# Patient Record
Sex: Female | Born: 1960
Health system: Southern US, Community
[De-identification: ages and names within clinical notes are randomized; demographics above are authoritative.]

## PROBLEM LIST (undated history)

## (undated) DIAGNOSIS — D649 Anemia, unspecified: Secondary | ICD-10-CM

## (undated) DIAGNOSIS — E785 Hyperlipidemia, unspecified: Secondary | ICD-10-CM

## (undated) DIAGNOSIS — E119 Type 2 diabetes mellitus without complications: Secondary | ICD-10-CM

## (undated) HISTORY — DX: Hyperlipidemia, unspecified: E78.5

## (undated) HISTORY — DX: Type 2 diabetes mellitus without complications: E11.9

## (undated) HISTORY — DX: Anemia, unspecified: D64.9

---

## 2005-07-01 ENCOUNTER — Ambulatory Visit: Payer: Self-pay | Admitting: Family Medicine

## 2005-07-01 DIAGNOSIS — E119 Type 2 diabetes mellitus without complications: Secondary | ICD-10-CM | POA: Insufficient documentation

## 2005-07-08 ENCOUNTER — Ambulatory Visit: Payer: Self-pay | Admitting: *Deleted

## 2005-07-21 ENCOUNTER — Ambulatory Visit: Payer: Self-pay | Admitting: Family Medicine

## 2005-08-21 ENCOUNTER — Ambulatory Visit (HOSPITAL_COMMUNITY): Admission: RE | Admit: 2005-08-21 | Discharge: 2005-08-21 | Payer: Self-pay | Admitting: Family Medicine

## 2005-10-20 ENCOUNTER — Ambulatory Visit: Payer: Self-pay | Admitting: Family Medicine

## 2005-10-22 ENCOUNTER — Ambulatory Visit: Payer: Self-pay | Admitting: Family Medicine

## 2005-12-22 ENCOUNTER — Ambulatory Visit: Payer: Self-pay | Admitting: Family Medicine

## 2005-12-22 LAB — CONVERTED CEMR LAB: Microalb, Ur: 0.47 mg/dL

## 2005-12-23 ENCOUNTER — Ambulatory Visit: Payer: Self-pay | Admitting: Family Medicine

## 2006-03-25 ENCOUNTER — Ambulatory Visit: Payer: Self-pay | Admitting: Family Medicine

## 2006-03-25 DIAGNOSIS — E785 Hyperlipidemia, unspecified: Secondary | ICD-10-CM

## 2006-03-25 DIAGNOSIS — D508 Other iron deficiency anemias: Secondary | ICD-10-CM

## 2006-03-26 ENCOUNTER — Encounter (INDEPENDENT_AMBULATORY_CARE_PROVIDER_SITE_OTHER): Payer: Self-pay | Admitting: Family Medicine

## 2006-03-26 LAB — CONVERTED CEMR LAB
Cholesterol: 229 mg/dL
LDL Cholesterol: 160 mg/dL

## 2006-04-11 ENCOUNTER — Emergency Department (HOSPITAL_COMMUNITY): Admission: EM | Admit: 2006-04-11 | Discharge: 2006-04-11 | Payer: Self-pay | Admitting: Emergency Medicine

## 2007-03-03 ENCOUNTER — Encounter (INDEPENDENT_AMBULATORY_CARE_PROVIDER_SITE_OTHER): Payer: Self-pay | Admitting: *Deleted

## 2007-04-29 ENCOUNTER — Encounter (INDEPENDENT_AMBULATORY_CARE_PROVIDER_SITE_OTHER): Payer: Self-pay | Admitting: Family Medicine

## 2007-05-05 ENCOUNTER — Telehealth (INDEPENDENT_AMBULATORY_CARE_PROVIDER_SITE_OTHER): Payer: Self-pay | Admitting: Family Medicine

## 2007-05-31 ENCOUNTER — Ambulatory Visit: Payer: Self-pay | Admitting: Family Medicine

## 2007-06-04 ENCOUNTER — Ambulatory Visit: Payer: Self-pay | Admitting: Family Medicine

## 2007-06-05 ENCOUNTER — Encounter (INDEPENDENT_AMBULATORY_CARE_PROVIDER_SITE_OTHER): Payer: Self-pay | Admitting: Family Medicine

## 2007-06-05 LAB — CONVERTED CEMR LAB
Albumin: 4.7 g/dL (ref 3.5–5.2)
BUN: 10 mg/dL (ref 6–23)
Calcium: 9.5 mg/dL (ref 8.4–10.5)
Chloride: 101 meq/L (ref 96–112)
Cholesterol: 180 mg/dL (ref 0–200)
Creatinine, Ser: 0.72 mg/dL (ref 0.40–1.20)
Glucose, Bld: 82 mg/dL (ref 70–99)
HDL: 48 mg/dL (ref >39)
Hemoglobin: 12.6 g/dL (ref 12.0–15.0)
Lymphs Abs: 3.4 10*3/uL (ref 0.7–4.0)
MCV: 75.9 fL — ABNORMAL LOW (ref 78.0–100.0)
Monocytes Absolute: 0.4 10*3/uL (ref 0.1–1.0)
Monocytes Relative: 5 % (ref 3–12)
Neutro Abs: 3.8 10*3/uL (ref 1.7–7.7)
Neutrophils Relative %: 49 % (ref 43–77)
Potassium: 4 meq/L (ref 3.5–5.3)
RBC: 5.44 M/uL — ABNORMAL HIGH (ref 3.87–5.11)
Triglycerides: 87 mg/dL (ref <150)
WBC: 7.8 10*3/uL (ref 4.0–10.5)

## 2007-06-25 ENCOUNTER — Ambulatory Visit (HOSPITAL_COMMUNITY): Admission: RE | Admit: 2007-06-25 | Discharge: 2007-06-25 | Payer: Self-pay | Admitting: Family Medicine

## 2007-09-07 ENCOUNTER — Ambulatory Visit: Payer: Self-pay | Admitting: Family Medicine

## 2007-09-07 ENCOUNTER — Encounter (INDEPENDENT_AMBULATORY_CARE_PROVIDER_SITE_OTHER): Payer: Self-pay | Admitting: Family Medicine

## 2007-09-07 LAB — CONVERTED CEMR LAB
Beta hcg, urine, semiquantitative: NEGATIVE
Bilirubin Urine: NEGATIVE
Blood in Urine, dipstick: NEGATIVE
Glucose, Urine, Semiquant: NEGATIVE
Hgb A1c MFr Bld: 7.8 %
Ketones, urine, test strip: NEGATIVE
Specific Gravity, Urine: <1.005
WBC Urine, dipstick: NEGATIVE
pH: 5.5

## 2007-09-09 LAB — CONVERTED CEMR LAB: Pap Smear: NORMAL

## 2007-12-08 ENCOUNTER — Telehealth (INDEPENDENT_AMBULATORY_CARE_PROVIDER_SITE_OTHER): Payer: Self-pay | Admitting: Family Medicine

## 2008-04-06 ENCOUNTER — Telehealth (INDEPENDENT_AMBULATORY_CARE_PROVIDER_SITE_OTHER): Payer: Self-pay | Admitting: *Deleted

## 2008-04-11 ENCOUNTER — Ambulatory Visit: Payer: Self-pay | Admitting: Family Medicine

## 2008-04-11 DIAGNOSIS — M25519 Pain in unspecified shoulder: Secondary | ICD-10-CM | POA: Insufficient documentation

## 2008-04-11 LAB — CONVERTED CEMR LAB: Blood Glucose, Fingerstick: 116

## 2008-04-13 ENCOUNTER — Encounter (INDEPENDENT_AMBULATORY_CARE_PROVIDER_SITE_OTHER): Payer: Self-pay | Admitting: Family Medicine

## 2008-04-13 LAB — CONVERTED CEMR LAB
AST: 23 units/L (ref 0–37)
Albumin: 4.5 g/dL (ref 3.5–5.2)
Alkaline Phosphatase: 65 units/L (ref 39–117)
Eosinophils Absolute: 0.3 10*3/uL (ref 0.0–0.7)
Glucose, Bld: 67 mg/dL — ABNORMAL LOW (ref 70–99)
LDL Cholesterol: 88 mg/dL (ref 0–99)
Lymphs Abs: 3.6 10*3/uL (ref 0.7–4.0)
MCV: 73.4 fL — ABNORMAL LOW (ref 78.0–100.0)
Neutrophils Relative %: 43 % (ref 43–77)
Platelets: 335 10*3/uL (ref 150–400)
Potassium: 4.2 meq/L (ref 3.5–5.3)
Sodium: 137 meq/L (ref 135–145)
Total Protein: 7.9 g/dL (ref 6.0–8.3)
WBC: 7.6 10*3/uL (ref 4.0–10.5)

## 2008-04-19 ENCOUNTER — Telehealth (INDEPENDENT_AMBULATORY_CARE_PROVIDER_SITE_OTHER): Payer: Self-pay | Admitting: Family Medicine

## 2008-06-30 ENCOUNTER — Ambulatory Visit (HOSPITAL_COMMUNITY): Admission: RE | Admit: 2008-06-30 | Discharge: 2008-06-30 | Payer: Self-pay | Admitting: Family Medicine

## 2008-07-12 ENCOUNTER — Encounter (INDEPENDENT_AMBULATORY_CARE_PROVIDER_SITE_OTHER): Payer: Self-pay | Admitting: *Deleted

## 2008-07-26 ENCOUNTER — Ambulatory Visit: Payer: Self-pay | Admitting: Family Medicine

## 2008-07-26 LAB — CONVERTED CEMR LAB: Blood Glucose, Fingerstick: 122

## 2008-09-07 ENCOUNTER — Encounter (INDEPENDENT_AMBULATORY_CARE_PROVIDER_SITE_OTHER): Payer: Self-pay | Admitting: Family Medicine

## 2008-11-01 ENCOUNTER — Ambulatory Visit: Payer: Self-pay | Admitting: *Deleted

## 2008-11-19 ENCOUNTER — Emergency Department (HOSPITAL_COMMUNITY): Admission: EM | Admit: 2008-11-19 | Discharge: 2008-11-19 | Payer: Self-pay | Admitting: Family Medicine

## 2008-11-27 ENCOUNTER — Ambulatory Visit: Payer: Self-pay | Admitting: Family Medicine

## 2008-11-27 DIAGNOSIS — N764 Abscess of vulva: Secondary | ICD-10-CM

## 2008-12-04 ENCOUNTER — Telehealth (INDEPENDENT_AMBULATORY_CARE_PROVIDER_SITE_OTHER): Payer: Self-pay | Admitting: Family Medicine

## 2008-12-07 ENCOUNTER — Ambulatory Visit: Payer: Self-pay | Admitting: Nurse Practitioner

## 2008-12-07 LAB — CONVERTED CEMR LAB
Basophils Absolute: 0 10*3/uL (ref 0.0–0.1)
Blood Glucose, Fingerstick: 204
Eosinophils Relative: 5 % (ref 0–5)
Glucose, Urine, Semiquant: NEGATIVE
Hgb A1c MFr Bld: 8.1 %
Lymphocytes Relative: 41 % (ref 12–46)
Microalb, Ur: 1.39 mg/dL (ref 0.00–1.89)
Neutro Abs: 3.5 10*3/uL (ref 1.7–7.7)
Platelets: 374 10*3/uL (ref 150–400)
RDW: 17.1 % — ABNORMAL HIGH (ref 11.5–15.5)
Specific Gravity, Urine: >=1.03
pH: 6

## 2008-12-08 ENCOUNTER — Encounter (INDEPENDENT_AMBULATORY_CARE_PROVIDER_SITE_OTHER): Payer: Self-pay | Admitting: Nurse Practitioner

## 2008-12-12 ENCOUNTER — Inpatient Hospital Stay (HOSPITAL_COMMUNITY): Admission: AD | Admit: 2008-12-12 | Discharge: 2008-12-12 | Payer: Self-pay | Admitting: Obstetrics & Gynecology

## 2009-01-15 ENCOUNTER — Ambulatory Visit: Payer: Self-pay | Admitting: Family Medicine

## 2009-01-15 DIAGNOSIS — M1610 Unilateral primary osteoarthritis, unspecified hip: Secondary | ICD-10-CM | POA: Insufficient documentation

## 2009-01-15 LAB — CONVERTED CEMR LAB: Blood Glucose, Fingerstick: 129

## 2009-01-16 ENCOUNTER — Ambulatory Visit (HOSPITAL_COMMUNITY): Admission: RE | Admit: 2009-01-16 | Discharge: 2009-01-16 | Payer: Self-pay | Admitting: Family Medicine

## 2009-01-24 ENCOUNTER — Ambulatory Visit: Payer: Self-pay | Admitting: Nurse Practitioner

## 2009-01-25 DIAGNOSIS — M5137 Other intervertebral disc degeneration, lumbosacral region: Secondary | ICD-10-CM

## 2009-01-25 DIAGNOSIS — M51379 Other intervertebral disc degeneration, lumbosacral region without mention of lumbar back pain or lower extremity pain: Secondary | ICD-10-CM | POA: Insufficient documentation

## 2009-01-25 DIAGNOSIS — M1710 Unilateral primary osteoarthritis, unspecified knee: Secondary | ICD-10-CM | POA: Insufficient documentation

## 2009-01-25 DIAGNOSIS — IMO0002 Reserved for concepts with insufficient information to code with codable children: Secondary | ICD-10-CM

## 2009-02-07 ENCOUNTER — Ambulatory Visit: Payer: Self-pay | Admitting: *Deleted

## 2009-07-31 ENCOUNTER — Ambulatory Visit: Payer: Self-pay | Admitting: Nurse Practitioner

## 2009-09-07 ENCOUNTER — Ambulatory Visit: Payer: Self-pay | Admitting: Nurse Practitioner

## 2009-09-08 ENCOUNTER — Encounter (INDEPENDENT_AMBULATORY_CARE_PROVIDER_SITE_OTHER): Payer: Self-pay | Admitting: Nurse Practitioner

## 2009-09-10 ENCOUNTER — Encounter (INDEPENDENT_AMBULATORY_CARE_PROVIDER_SITE_OTHER): Payer: Self-pay | Admitting: Nurse Practitioner

## 2009-09-10 ENCOUNTER — Telehealth (INDEPENDENT_AMBULATORY_CARE_PROVIDER_SITE_OTHER): Payer: Self-pay | Admitting: Nurse Practitioner

## 2009-09-10 LAB — CONVERTED CEMR LAB
ALT: 15 units/L (ref 0–35)
AST: 20 units/L (ref 0–37)
Albumin: 4.5 g/dL (ref 3.5–5.2)
Alkaline Phosphatase: 68 units/L (ref 39–117)
Basophils Absolute: 0 10*3/uL (ref 0.0–0.1)
Calcium: 9.4 mg/dL (ref 8.4–10.5)
Chlamydia, DNA Probe: NEGATIVE
Chloride: 102 meq/L (ref 96–112)
Eosinophils Relative: 1 % (ref 0–5)
GC Probe Amp, Genital: NEGATIVE
HCT: 39.1 % (ref 36.0–46.0)
LDL Cholesterol: 123 mg/dL — ABNORMAL HIGH (ref 0–99)
Lymphocytes Relative: 38 % (ref 12–46)
Lymphs Abs: 2.3 10*3/uL (ref 0.7–4.0)
Neutro Abs: 3.3 10*3/uL (ref 1.7–7.7)
Neutrophils Relative %: 54 % (ref 43–77)
Platelets: 353 10*3/uL (ref 150–400)
Potassium: 4.4 meq/L (ref 3.5–5.3)
Sodium: 141 meq/L (ref 135–145)
TSH: 2.07 microintl units/mL (ref 0.350–4.500)
WBC: 6.2 10*3/uL (ref 4.0–10.5)

## 2009-09-21 ENCOUNTER — Ambulatory Visit (HOSPITAL_COMMUNITY): Admission: RE | Admit: 2009-09-21 | Discharge: 2009-09-21 | Payer: Self-pay | Admitting: Internal Medicine

## 2009-11-22 ENCOUNTER — Ambulatory Visit: Payer: Self-pay | Admitting: Internal Medicine

## 2009-11-22 ENCOUNTER — Encounter (INDEPENDENT_AMBULATORY_CARE_PROVIDER_SITE_OTHER): Payer: Self-pay | Admitting: Nurse Practitioner

## 2009-12-12 ENCOUNTER — Encounter (INDEPENDENT_AMBULATORY_CARE_PROVIDER_SITE_OTHER): Payer: Self-pay | Admitting: Nurse Practitioner

## 2010-07-14 LAB — CONVERTED CEMR LAB
Blood in Urine, dipstick: NEGATIVE
Glucose, Urine, Semiquant: NEGATIVE
KOH Prep: NEGATIVE
Ketones, urine, test strip: NEGATIVE
Pap Smear: NEGATIVE
Protein, U semiquant: NEGATIVE
WBC Urine, dipstick: NEGATIVE

## 2010-07-16 NOTE — Letter (Signed)
Summary: Lipid Letter  [facility]  [address] [place], [place] [address]   Phone: [phone]  Fax: [phone]    09/10/2009  Debralee Braaksma [address] [place], [place]  [address]  Dear Arley Phenix:  We have carefully reviewed your last lipid profile from 09/08/2009 and the results are noted below with a summary of recommendations for lipid management.    Cholesterol:       183     Goal: less than 200   HDL "good" Cholesterol:   39     Goal: greater than 40   LDL "bad" Cholesterol:   123     Goal: less than 70   Triglycerides:       106     Goal: less than 150    Your "Bad Cholesterol" is still slightly elevated.   Crestor was increased to 20mg  by mouth  nightly. All other labs are ok.    Pap Smear results __________________________.     Current Medications: 1)    Ferrous Sulfate 325 (65 Fe) Mg  Tabs (Ferrous sulfate) .Marland Kitchen.. 1 tablet po each  day 2)    Glucovance 5-500 Mg  Tabs (Glyburide-metformin) .... Two tablets by mouth two times a day 3)    Crestor 20 Mg Tabs (Rosuvastatin calcium) .... One tablet by mouth nightly for cholesterol  **note dose increase** 4)    Actos 30 Mg Tabs (Pioglitazone hcl) .Marland Kitchen.. 1 tablet by mouth daily for blood sugar **note change in dose** 5)    Arthrotec 75 75-200 Mg-mcg Tabs (Diclofenac-misoprostol) .... One tablet by mouth two times a day for inflammation in hip & knee 6)    Aspir-low 81 Mg Tbec (Aspirin) .... One tablet by mouth daily  If you have any questions, please call. We appreciate being able to work with you.   Sincerely,    [facility] Lehman Prom FNP

## 2010-07-16 NOTE — Letter (Signed)
Summary: *HSN Results Follow up  [facility]  [address] [place], [place] [address]   Phone: [phone]  Fax: [phone]      12/12/2009   Meredith Ryan [address] [place], [place]  [address]   Dear  [name] Plan,                            ____S.Drinkard,FNP   ____D. Gore,FNP       ____B. McPherson,MD   ____V. Rankins,MD    ____E. Mulberry,MD    _X___[name], FNP  ____D. Reche Dixon, MD    ____K. Philipp Deputy, MD    ____Other     This letter is to inform you that your recent test(s):  [facility] Scre[facility]   ___X____ is within acceptable limits  _______ requires a medication change  _______ requires a follow-up lab visit  _______ requires a follow-up visit with your provider   Comments:  [facility] eye screening is normal.       _________________________________________________________ If you have any questions, please contact our office [phone]                    Sincerely,    Lehman Prom FNP [facility]

## 2010-07-16 NOTE — Assessment & Plan Note (Signed)
Summary: Complete Physical Exam   Vital Signs:  Patient profile:   50 year old female Menstrual status:  perimenopausal LMP:     06/18/2009 Weight:      177.8 pounds BMI:     31.36 BSA:     1.85 Temp:     97.5 degrees F oral Pulse rate:   75 / minute Pulse rhythm:   regular Resp:     20 per minute BP sitting:   118 / 79  (left arm) Cuff size:   large  Vitals Entered By: Levon Hedger (September 07, 2009 2:43 PM) CC: CPP Is Patient Diabetic? Yes Pain Assessment Patient in pain? no      CBG Result 78 CBG Device ID B  Does patient need assistance? Functional Status Self care Ambulation Normal  Vision Screening:      Vision Comments: 06/2009 LMP (date): 06/18/2009 LMP - Character: normal     Menstrual Status perimenopausal Enter LMP: 06/18/2009 Last PAP Result normal   Diabetic Foot Exam Foot Inspection Is there a history of a foot ulcer?              No Is there a foot ulcer now?              No Can the patient see the bottom of their feet?          No Are the shoes appropriate in style and fit?          Yes Is there swelling or an abnormal foot shape?          No Are the toenails long?                No Are the toenails thick?                No Are the toenails ingrown?              No Is there heavy callous build-up?              No Is there pain in the calf muscle (Intermittent claudication) when walking?    NoIs there a claw toe deformity?              No Is there elevated skin temperature?            No Is there limited ankle dorsiflexion?            No Is there foot or ankle muscle weakness?            No  Diabetic Foot Care Education Pulse Check          Right Foot          Left Foot Dorsalis Pedis:        normal            normal    10-g (5.07) Semmes-Weinstein Monofilament Test           Right Foot          Left Foot Visual Inspection               Test Control      normal         normal Site 1         normal         normal Site 2          normal         normal Site 3  normal         normal Site 4         normal         normal Site 5         normal         normal Site 6         normal         normal Site 7         normal         normal Site 8         normal         normal Site 9         normal         normal Site 10         normal         normal  Impression      normal         normal  Legend:  Site 1 = Plantar aspect of first toe (center of pad) Site 2 = Plantar aspect of third toe (center of pad) Site 3 = Plantar aspect of fifth toe (center of pad) Site 4 = Plantar aspect of first metatarsal head Site 5 = Plantar aspect of third metatarsal head Site 6 = Plantar aspect of fifth metatarsal head Site 7 = Plantar aspect of medial midfoot Site 8 = Plantar aspect of lateral midfoot Site 9 = Plantar aspect of heel Site 10 = dorsal aspect of foot between the base of the first and second toes   Result is Abnormal if patient was unable to perceive the monofilament at site indicated.    CC:  CPP.  History of Present Illness:  Pt into the office for a complete physical exam  PAP - Last pap smear was about 2 years ago Pt is married with 5 children.   Menses - irregular, last menses was 2 months ago  Mammogram - last mammogram was about 2 years ago no family history of breast cancer. no self breast exams at home  Dental - no recent dental exam but aware that she needs a dental exam  Optho - wears glasses but last eye exam was over 1 year ago  tdap - up to date  Colonscopy - never done no family hx of colon cancer  Diabetes Management History:      The patient is a 50 years old female who comes in for evaluation of DM Type 2.  She has not been enrolled in the "Diabetic Education Program".  She states understanding of dietary principles and is following her diet appropriately.  No sensory loss is reported.  Self foot exams are not being performed.  She is checking home blood sugars.  She says that she is  exercising.  Type of exercise includes: walking.  Duration of exercise is estimated to be 15 min.  She is doing this 3 times per week.        Hypoglycemic symptoms are not occurring.  No hyperglycemic symptoms are reported.        No changes have been made to her treatment plan since last visit.     Habits & Providers  Alcohol-Tobacco-Diet     Alcohol drinks/day: 0     Tobacco Status: never  Exercise-Depression-Behavior     Does Patient Exercise: yes     Exercise Counseling: to improve exercise regimen     Type of exercise: walking     Exercise (avg:  min/session): 15 min.     Times/week: 3     Have you felt down or hopeless? no     Have you felt little pleasure in things? no     Depression Counseling: not indicated; screening negative for depression     Drug Use: no     Seat Belt Use: 100  Diabetic Foot Exam Foot Inspection Is there a history of a foot ulcer?              No Is there a foot ulcer now?              No Can the patient see the bottom of their feet?          No Are the shoes appropriate in style and fit?          Yes Is there swelling or an abnormal foot shape?          No Are the toenails long?                No Are the toenails thick?                No Are the toenails ingrown?              No Is there heavy callous build-up?              No Is there pain in the calf muscle (Intermittent claudication) when walking?    NoIs there a claw toe deformity?              No Is there elevated skin temperature?            No Is there limited ankle dorsiflexion?            No Is there foot or ankle muscle weakness?            No  Diabetic Foot Care Education Pulse Check          Right Foot          Left Foot Dorsalis Pedis:        normal            normal    10-g (5.07) Semmes-Weinstein Monofilament Test           Right Foot          Left Foot Visual Inspection               Test Control      normal         normal Site 1         normal         normal Site 2          normal         normal Site 3         normal         normal Site 4         normal         normal Site 5         normal         normal Site 6         normal         normal Site 7         normal         normal Site 8         normal  normal Site 9         normal         normal Site 10         normal         normal  Impression      normal         normal  Legend:  Site 1 = Plantar aspect of first toe (center of pad) Site 2 = Plantar aspect of third toe (center of pad) Site 3 = Plantar aspect of fifth toe (center of pad) Site 4 = Plantar aspect of first metatarsal head Site 5 = Plantar aspect of third metatarsal head Site 6 = Plantar aspect of fifth metatarsal head Site 7 = Plantar aspect of medial midfoot Site 8 = Plantar aspect of lateral midfoot Site 9 = Plantar aspect of heel Site 10 = dorsal aspect of foot between the base of the first and second toes   Result is Abnormal if patient was unable to perceive the monofilament at site indicated.   Allergies (verified): No Known Drug Allergies  Review of Systems General:  Denies fever. Eyes:  Denies blurring. ENT:  Denies earache. CV:  Denies chest pain or discomfort. Resp:  Denies cough. GI:  Denies abdominal pain, nausea, and vomiting. GU:  Denies discharge. MS:  Complains of joint pain; bil knee pain but improved with arthrotec. Derm:  Denies rash. Neuro:  Denies headaches. Psych:  Denies anxiety and depression.  Physical Exam  General:  alert.  obese Head:  normocephalic.   Eyes:  pupils equal, pupils round, and pupils reactive to light.   Ears:  R ear normal and L ear normal.  Bil TM with bony landmarks present Nose:  no nasal discharge.   Mouth:  pharynx pink and moist and fair dentition.   Neck:  supple.   Chest Wall:  no mass.   Breasts:  skin/areolae normal, no masses, and no abnormal thickening.   Lungs:  normal breath sounds.   Heart:  normal rate and regular rhythm.   Abdomen:  soft, non-tender,  and normal bowel sounds.   Rectal:  no external abnormalities.    Pelvic Exam  Vulva:      normal appearance.   Urethra and Bladder:      female circumcion Cervix:      midposition.   Adnexa:      nontender bilaterally.   Rectum:      normal, heme negative stool.    Diabetes Management Exam:    Foot Exam (with socks and/or shoes not present):       Sensory-Monofilament:          Left foot: normal          Right foot: normal       Inspection:          Left foot: normal          Right foot: normal       Nails:          Left foot: normal          Right foot: normal    Eye Exam:       Eye Exam done elsewhere          Date: 06/16/2008          Results: normal   Diabetic Foot Exam Foot Inspection Is there a history of a foot ulcer?              No Is there  a foot ulcer now?              No Can the patient see the bottom of their feet?          No Are the shoes appropriate in style and fit?          Yes Is there swelling or an abnormal foot shape?          No Are the toenails long?                No Are the toenails thick?                No Are the toenails ingrown?              No Is there heavy callous build-up?              No Is there pain in the calf muscle (Intermittent claudication) when walking?    NoIs there a claw toe deformity?              No Is there elevated skin temperature?            No Is there limited ankle dorsiflexion?            No Is there foot or ankle muscle weakness?            No  Diabetic Foot Care Education Pulse Check          Right Foot          Left Foot Dorsalis Pedis:        normal            normal    10-g (5.07) Semmes-Weinstein Monofilament Test           Right Foot          Left Foot Visual Inspection               Test Control      normal         normal Site 1         normal         normal Site 2         normal         normal Site 3         normal         normal Site 4         normal         normal Site 5         normal          normal Site 6         normal         normal Site 7         normal         normal Site 8         normal         normal Site 9         normal         normal Site 10         normal         normal  Impression      normal         normal  Legend:  Site 1 = Plantar aspect of first toe (center of pad) Site 2 = Plantar aspect of third toe (center of pad) Site 3 = Plantar aspect  of fifth toe (center of pad) Site 4 = Plantar aspect of first metatarsal head Site 5 = Plantar aspect of third metatarsal head Site 6 = Plantar aspect of fifth metatarsal head Site 7 = Plantar aspect of medial midfoot Site 8 = Plantar aspect of lateral midfoot Site 9 = Plantar aspect of heel Site 10 = dorsal aspect of foot between the base of the first and second toes   Result is Abnormal if patient was unable to perceive the monofilament at site indicated.    Impression & Recommendations:  Problem # 1:  ROUTINE GYNECOLOGICAL EXAMINATION (ICD-V72.31) will check labs today PHQ-9 score = 0 PAP done EKG done stool card given to pt. colonscopy indications given to pt rec optho exam and dental exam Orders: KOH/ WET Mount [phone]) Pap Smear, Thin Prep ( Collection of) (J4782) UA Dipstick w/o Micro (manual) (95621) Hemoccult Guaiac-1 spec.(in office) (82270) T- GC Chlamydia (30865) Rapid HIV  (92370) Hemoccult Cards (Take Home) (Hemoccult Cards)  Problem # 2:  UNSPECIFIED BREAST SCREENING (ICD-V76.10) self breast exam done mammogram scheduled Orders: Mammogram (Screening) (Mammo)  Problem # 3:  DIABETES MELLITUS, TYPE II (ICD-250.00) improved continue current medications rec eye exam Her updated medication list for this problem includes:    Glucovance 5-500 Mg Tabs (Glyburide-metformin) .Marland Kitchen... 2 by mouth two times a day    Actos 30 Mg Tabs (Pioglitazone hcl) .Marland Kitchen... 1 tablet by mouth daily for blood sugar **note change in dose**    Aspir-low 81 Mg Tbec (Aspirin) ..... One tablet by mouth  daily  Orders: Hemoglobin A1C (78469) T-Comprehensive Metabolic Panel ([address]2952-84132) T-Urine Microalbumin w/creat. ratio [phone]) T-Urine Microalbumin w/creat. 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 1 tablet po each  day  Orders: T-CBC w/Diff ([id number]) T-TSH (41660-63016)  Problem # 5:  HYPERLIPIDEMIA (ICD-272.4) will check labs Her updated medication list for this problem includes:    Crestor 10 Mg Tabs (Rosuvastatin calcium) .Marland Kitchen... 1 by mouth daily  Orders: T-Lipid Profile ([id number]) T-Comprehensive Metabolic Panel ([phone])  Complete Medication List: 1)  Ferrous Sulfate 325 (65 Fe) Mg Tabs (Ferrous sulfate) .Marland Kitchen.. 1 tablet po each  day 2)  Glucovance 5-500 Mg Tabs (Glyburide-metformin) .... 2 by mouth two times a day 3)  Crestor 10 Mg Tabs (Rosuvastatin calcium) .Marland Kitchen.. 1 by mouth daily 4)  Actos 30 Mg Tabs (Pioglitazone hcl) .Marland Kitchen.. 1 tablet by mouth daily for blood sugar **note change in dose** 5)  Arthrotec 75 75-200 Mg-mcg Tabs (Diclofenac-misoprostol) .... One tablet by mouth two times a day for inflammation in hip & knee 6)  Aspir-low 81 Mg Tbec (Aspirin) .... One tablet by mouth daily  Diabetes Management Assessment/Plan:      Her blood pressure goal is < 130/80.    Patient Instructions: 1)  You will be notified of any abnormal lab results 2)  Keep your appointment for mammogram 3)  Your medications will be sent to [facility] pharmacy on Monday after your labs return. 4)  Folllow up in 4 months for diabetes or sooner if necessary  Laboratory Results   Urine Tests  Date/Time Received: September 07, 2009 2:58 PM   Routine Urinalysis   Glucose: negative   (Normal Range: Negative) Bilirubin: negative   (Normal Range: Negative) Ketone: negative   (Normal Range:  Negative) Spec. Gravity: 1.015   (Normal  Range: 1.003-1.035) Blood: negative   (Normal Range: Negative) pH: 5.5   (Normal Range: 5.0-8.0) Protein: negative   (Normal Range: Negative) Urobilinogen: 0.2   (Normal Range: 0-1) Nitrite: negative   (Normal Range: Negative) Leukocyte Esterace: negative   (Normal Range: Negative)     Blood Tests   Date/Time Received: September 07, 2009 3:44 PM   HGBA1C: 7.3%   (Normal Range: Non-Diabetic - 3-6%   Control Diabetic - 6-8%) CBG Random:: 78  Date/Time Received: September 07, 2009 3:47 PM   Wet Mount/KOH Source: vaginal WBC/hpf: 1-5 Bacteria/hpf: rare Clue cells/hpf: none Yeast/hpf: none Trichomonas/hpf: none  Stool - Occult Blood Hemmoccult #1: negative Date: 09/07/2009     EKG  Procedure date:  09/07/2009  Findings:      normal sinus rhythm possible left atrial enlargement    Prevention & Chronic Care Immunizations   Influenza vaccine: Fluvax 3+  (04/11/2008)   Influenza vaccine deferral: Not available  (09/07/2009)    Tetanus booster: 07/21/2005: Td 07/21/2005    Pneumococcal vaccine: Pneumovax  (07/21/2005)  Colorectal Screening   Hemoccult: Not documented   Hemoccult action/deferral: Ordered  (09/07/2009)   Hemoccult due: 09/08/2010    Colonoscopy: Not documented   Colonoscopy action/deferral: Deferred  (09/07/2009)  Other Screening   Pap smear: normal  (09/09/2007)   Pap smear action/deferral: Ordered  (09/07/2009)    Mammogram: normal  (06/25/2007)   Mammogram action/deferral: Ordered  (09/07/2009)   Smoking status: never  (09/07/2009)  Diabetes Mellitus   HgbA1C: 7.3  (09/07/2009)   HgbA1C action/deferral: Ordered  (09/07/2009)    Eye exam: normal  (06/16/2008)    Foot exam: yes  (09/07/2009)   Foot exam action/deferral: Do today   High risk foot: Not documented   Foot care education: Not documented    Urine microalbumin/creatinine ratio: Not documented   Urine microalbumin action/deferral:  Ordered   Urine microalbumin/cr due: 09/08/2010  Lipids   Total Cholesterol: 153  (04/11/2008)   Lipid panel action/deferral: Lipid Panel ordered   LDL: 88  (04/11/2008)   LDL Direct: Not documented   HDL: 48  (04/11/2008)   Triglycerides: 87  (04/11/2008)   Lipid panel due: 09/08/2010    SGOT (AST): 23  (04/11/2008)   BMP action: Ordered   SGPT (ALT): 14  (04/11/2008) CMP ordered    Alkaline phosphatase: 65  (04/11/2008)   Total bilirubin: 0.3  (04/11/2008)   Liver panel due: 09/08/2010  Self-Management Support :    Diabetes self-management support: Not documented    Lipid self-management support: Not documented    Nursing Instructions: Diabetic foot exam today   Laboratory Results   Urine Tests    Routine Urinalysis   Glucose: negative   (Normal Range: Negative) Bilirubin: negative   (Normal Range: Negative) Ketone: negative   (Normal Range: Negative) Spec. Gravity: 1.015   (Normal Range: 1.003-1.035) Blood: negative   (Normal Range: Negative) pH: 5.5   (Normal Range: 5.0-8.0) Protein: negative   (Normal Range: Negative) Urobilinogen: 0.2   (Normal Range: 0-1) Nitrite: negative   (Normal Range: Negative) Leukocyte Esterace: negative   (Normal Range: Negative)     Blood Tests     HGBA1C: 7.3%   (Normal Range: Non-Diabetic - 3-6%   Control Diabetic - 6-8%) CBG Random:: 78mg /dL    DIRECTV KOH: Negative  Stool - Occult Blood Hemmoccult #1: negative

## 2010-07-16 NOTE — Assessment & Plan Note (Signed)
Summary: Acute - Vulvar Abscess   Vital Signs:  Patient profile:   50 year old female Menstrual status:  postmenopausal Weight:      177.4 pounds BMI:     31.29 BSA:     1.84 Temp:     97.6 degrees F oral Pulse rate:   96 / minute Pulse rhythm:   regular Resp:     20 per minute BP sitting:   135 / 85  (left arm) Cuff size:   large  Vitals Entered By: Levon Hedger (July 31, 2009 9:11 AM) CC: abcess in vagina that reappeared on the 07/26/09 she has been having fever and feeling fatigued Is Patient Diabetic? No Pain Assessment Patient in pain? yes      CBG Result 131 CBG Device ID B  Does patient need assistance? Functional Status Self care Ambulation Normal     Menstrual Status postmenopausal Last PAP Result normal   CC:  abcess in vagina that reappeared on the 07/26/09 she has been having fever and feeling fatigued.  History of Present Illness:  Pt into the office with complaints of vaginal abscess. Started 3 days ago. +female circumscion Last dx was in 11/2008 and she was referred to [facility] but by the time she arrived there the area had resolved so no intervention needed. +fever +pain +drainage +dysuria She has applied heat to the affected area which has helped some.  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  All meds present with pt today  Habits & Providers  Alcohol-Tobacco-Diet     Alcohol drinks/day: 0     Tobacco Status: never  Exercise-Depression-Behavior     Does Patient Exercise: yes     Drug Use: no     Seat Belt Use: 100  Current Medications (verified): 1)  Ferrous Sulfate 325 (65 Fe) Mg  Tabs (Ferrous Sulfate) .Marland Kitchen.. 1 Tablet Po Each  Day 2)  Glucovance 5-500 Mg  Tabs (Glyburide-Metformin) .... 2 By Mouth Two Times A Day 3)  Crestor 10 Mg  Tabs (Rosuvastatin Calcium) .Marland Kitchen.. 1 By Mouth Daily 4)  Actos 30 Mg Tabs (Pioglitazone Hcl) .Marland Kitchen.. 1  Tablet By Mouth Daily For Blood Sugar **note Change in Dose** 5)  Arthrotec 75 75-200 Mg-Mcg Tabs (Diclofenac-Misoprostol) .... One Tablet By Mouth Two Times A Day For Inflammation in Hip & Knee 6)  Doxycycline Hyclate 100 Mg Caps (Doxycycline Hyclate) .... One Tablet By Mouth Two Times A Day For Infection  Allergies (verified): No Known Drug Allergies  Review of Systems General:  Complains of fatigue and fever. CV:  Denies chest pain or discomfort. Resp:  Denies cough. GU:  Complains of dysuria; vaginal pain .  Physical Exam  General:  alert.   Eyes:  glasses Lungs:  normal breath sounds.   Heart:  normal rate and regular rhythm.   Genitalia:  female circumcision Left labia majora - inflammation, tender, erythema  Msk:  up to the exam table Neurologic:  alert & oriented X3.     Impression & Recommendations:  Problem # 1:  VULVAR ABSCESS (ICD-616.4) advised pt to continue applying warm compresses will treat with doxycycline 100mg  by mouth daily  CPe due - advised pt to make an appt  Complete Medication List: 1)  Ferrous Sulfate 325 (65 Fe) Mg Tabs (Ferrous sulfate) .Marland Kitchen.. 1 tablet po each  day 2)  Glucovance 5-500 Mg Tabs (Glyburide-metformin) .Marland KitchenMarland KitchenMarland Kitchen  2 by mouth two times a day 3)  Crestor 10 Mg Tabs (Rosuvastatin calcium) .Marland Kitchen.. 1 by mouth daily 4)  Actos 30 Mg Tabs (Pioglitazone hcl) .Marland Kitchen.. 1 tablet by mouth daily for blood sugar **note change in dose** 5)  Arthrotec 75 75-200 Mg-mcg Tabs (Diclofenac-misoprostol) .... One tablet by mouth two times a day for inflammation in hip & knee 6)  Doxycycline Hyclate 100 Mg Caps (Doxycycline hyclate) .... One tablet by mouth two times a day for infection  Other Orders: Capillary Blood Glucose/CBG (96045)  Patient Instructions: 1)  Schedule an appointment for a complete physical exam 2)  do not eat after midnight before this visit 3)  will need PAP, EKG, mammogram, PHQ-9 and fasting labs 4)  Continue to apply warm compresses to the  affected area 5)  take antibiotics as ordered Prescriptions: DOXYCYCLINE HYCLATE 100 MG CAPS (DOXYCYCLINE HYCLATE) One tablet by mouth two times a day for infection  #20 x 0   Entered and Authorized by:   Lehman Prom FNP   Signed by:   Lehman Prom FNP on 07/31/2009   Method used:   Print then Give to Patient   RxID:   [id number]

## 2010-07-16 NOTE — Progress Notes (Signed)
Summary: No urine  Phone Note From Other Clinic   Summary of Call: Tonya called from [facility].  No urine for microalbumin Initial call taken by: Vesta Mixer CMA,  September 10, 2009 11:45 AM  Follow-up for Phone Call        noted Follow-up by: Lehman Prom FNP,  September 10, 2009 1:02 PM

## 2010-07-16 NOTE — Miscellaneous (Signed)
Summary: [facility] Screening   Diabetes Management History:      The patient is a 50 years old female who comes in for evaluation of Type 2 Diabetes Mellitus.  She has not been enrolled in the "Diabetic Education Program".  She is checking home blood sugars.  She says that she is exercising.  Type of exercise includes: walking.  Duration of exercise is estimated to be 15 min.  She is doing this 3 times per week.    Diabetes Management Exam:    Eye Exam:       Eye Exam done elsewhere          Date: 11/22/2009          Results: normal          Done by: [facility]  Diabetes Management Assessment/Plan:      Her blood pressure goal is < 130/80.   Clinical Lists Changes  Observations: Added new observation of EYES COMMENT: 11/2010 (12/12/2009 8:32) Added new observation of EYE EXAM BY: [facility] (11/22/2009 8:32) Added new observation of DMEYEEXMRES: normal (11/22/2009 8:32) Added new observation of DIAB EYE EX: normal (11/22/2009 8:32)

## 2010-07-16 NOTE — Progress Notes (Signed)
Summary: Office Visit//DEPRESSION SCREENING  Office Visit//DEPRESSION SCREENING   Imported By: Arta Bruce 11/19/2009 11:51:43  _____________________________________________________________________  External Attachment:    Type:   Image     Comment:   External Document

## 2010-07-16 NOTE — Letter (Signed)
Summary: [facility]  [facility]   Imported By: Arta Bruce 12/12/2009 12:54:33  _____________________________________________________________________  External Attachment:    Type:   Image     Comment:   External Document

## 2010-07-16 NOTE — Letter (Signed)
Summary: Out of School  [facility]  [address] [place], [place] [address]   Phone: [phone]  Fax: [phone]    July 31, 2009   Student:  Fuller Plan    To Whom It May Concern:   For Medical reasons, please excuse the above named student from school for the following dates:  Start:   July 26, 2009 and July 31, 2009  End:    August 02, 2009 - May return to school  If you need additional information, please feel free to contact our office.   Sincerely,    Lehman Prom FNP    ****This is a legal document and cannot be tampered with.  Schools are authorized to verify all information and to do so accordingly.

## 2010-07-16 NOTE — Letter (Signed)
Summary: TEST ORDER FORM//AMMOGRAM//APPT DATE&TIME  TEST ORDER FORM//AMMOGRAM//APPT DATE&TIME   Imported By: Arta Bruce 11/08/2009 15:37:57  _____________________________________________________________________  External Attachment:    Type:   Image     Comment:   External Document

## 2010-08-26 ENCOUNTER — Encounter: Payer: Self-pay | Admitting: Nurse Practitioner

## 2010-08-26 ENCOUNTER — Encounter (INDEPENDENT_AMBULATORY_CARE_PROVIDER_SITE_OTHER): Payer: Self-pay | Admitting: Nurse Practitioner

## 2010-08-26 DIAGNOSIS — L259 Unspecified contact dermatitis, unspecified cause: Secondary | ICD-10-CM | POA: Insufficient documentation

## 2010-08-26 DIAGNOSIS — G4700 Insomnia, unspecified: Secondary | ICD-10-CM | POA: Insufficient documentation

## 2010-08-26 LAB — CONVERTED CEMR LAB
Blood Glucose, Fingerstick: 106
Blood in Urine, dipstick: NEGATIVE
Glucose, Urine, Semiquant: NEGATIVE
Nitrite: NEGATIVE
Specific Gravity, Urine: 1.01
WBC Urine, dipstick: NEGATIVE
pH: 5

## 2010-08-27 ENCOUNTER — Encounter (INDEPENDENT_AMBULATORY_CARE_PROVIDER_SITE_OTHER): Payer: Self-pay | Admitting: Nurse Practitioner

## 2010-08-27 LAB — CONVERTED CEMR LAB
ALT: 12 units/L (ref 0–35)
AST: 20 units/L (ref 0–37)
Basophils Absolute: 0 10*3/uL (ref 0.0–0.1)
Basophils Relative: 0 % (ref 0–1)
Chloride: 102 meq/L (ref 96–112)
Creatinine, Ser: 0.68 mg/dL (ref 0.40–1.20)
Hemoglobin: 12.3 g/dL (ref 12.0–15.0)
Lymphocytes Relative: 31 % (ref 12–46)
MCHC: 31.9 g/dL (ref 30.0–36.0)
Monocytes Absolute: 0.4 10*3/uL (ref 0.1–1.0)
Neutro Abs: 4.7 10*3/uL (ref 1.7–7.7)
Platelets: 298 10*3/uL (ref 150–400)
RDW: 16.3 % — ABNORMAL HIGH (ref 11.5–15.5)
Sodium: 138 meq/L (ref 135–145)
Total Bilirubin: 0.3 mg/dL (ref 0.3–1.2)
Total Protein: 8 g/dL (ref 6.0–8.3)

## 2010-08-28 ENCOUNTER — Encounter (INDEPENDENT_AMBULATORY_CARE_PROVIDER_SITE_OTHER): Payer: Self-pay | Admitting: Nurse Practitioner

## 2010-09-03 NOTE — Letter (Signed)
Summary: Handout Printed  Printed Handout:  - Eczema (Atopic Dermatitis)

## 2010-09-03 NOTE — Assessment & Plan Note (Signed)
Summary: Eczema/Insomnia   Vital Signs:  Patient profile:   50 year old female Menstrual status:  perimenopausal Weight:      182.1 pounds BMI:     32.12 Temp:     97.5 degrees F oral Pulse rate:   91 / minute Pulse rhythm:   regular Resp:     20 per minute BP sitting:   126 / 82  (left arm) Cuff size:   large  Vitals Entered By: Levon Hedger (August 26, 2010 4:08 PM)  Nutrition Counseling: Patient's BMI is greater than 25 and therefore counseled on weight management options. CC: bleeding in bowels, Rash Is Patient Diabetic? Yes Pain Assessment Patient in pain? no      CBG Result 106  Does patient need assistance? Functional Status Self care Ambulation Normal   CC:  bleeding in bowels and Rash.  History of Present Illness:  Rash      This is a 50 year old woman who presents with Rash.  The symptoms began 2 weeks ago.  The severity is described as mild.  The patient complains of itching, but denies blisters.  The rash is located on the right forearm and left forearm.  The rash is better with OTC creams.  The patient denies the following symptoms: fever, abdominal pain, and nausea.  The patient denies history of recent infection and new clothing.    Husband present today during the exam Medications presents today with pt  Habits & Providers  Alcohol-Tobacco-Diet     Alcohol drinks/day: 0     Tobacco Status: never  Exercise-Depression-Behavior     Does Patient Exercise: yes     Exercise Counseling: to improve exercise regimen     Type of exercise: walking     Exercise (avg: min/session): 15 min.     Times/week: 3     Depression Counseling: not indicated; screening negative for depression     Drug Use: no     Seat Belt Use: 100  Current Medications (verified): 1)  Ferrous Sulfate 325 (65 Fe) Mg  Tabs (Ferrous Sulfate) .Marland Kitchen.. 1 Tablet Po Each  Day 2)  Glucovance 5-500 Mg  Tabs (Glyburide-Metformin) .... Two Tablets By Mouth Two Times A Day 3)  Crestor 20 Mg  Tabs (Rosuvastatin Calcium) .... One Tablet By Mouth Nightly For Cholesterol  **note Dose Increase** 4)  Actos 30 Mg Tabs (Pioglitazone Hcl) .Marland Kitchen.. 1 Tablet By Mouth Daily For Blood Sugar **note Change in Dose** 5)  Aspir-Low 81 Mg Tbec (Aspirin) .... One Tablet By Mouth Daily 6)  Triamcinolone Acetonide 0.1 % Crea (Triamcinolone Acetonide) .... One Application Topically Two Times A Day To Affected Area  Allergies: No Known Drug Allergies  Review of Systems General:  Complains of sleep disorder; for past 3-4 months her sleep has decreased.  she will go to sleep but then wakes after 4 hours. CV:  Denies chest pain or discomfort. Resp:  Denies cough. GI:  Denies abdominal pain, bloody stools, diarrhea, nausea, and vomiting; abnormals noted about 2 weeks ago when she made the appt but all have subsided at this time. Derm:  Complains of itching and rash.  Physical Exam  General:  obeses Head:  normocephalic.   Eyes:  glasses Lungs:  normal breath sounds.   Heart:  normal rate and regular rhythm.   Msk:  up to the exam table Neurologic:  alert & oriented X3.   Skin:  bilateral inner arm- hyperpigmentes macular rash Psych:  Oriented X3.  Impression & Recommendations:  Problem # 1:  ECZEMA (ICD-692.9) handout given advised pt to use cream as needed  Her updated medication list for this problem includes:    Triamcinolone Acetonide 0.1 % Crea (Triamcinolone acetonide) ..... One application topically two times a day to affected area  Problem # 2:  SLEEP DISORDER (ICD-780.50) handout given pt to take benadryl otc  Problem # 3:  DIABETES MELLITUS, TYPE II (ICD-250.00)  Her updated medication list for this problem includes:    Glucovance 5-500 Mg Tabs (Glyburide-metformin) .Marland Kitchen..Marland Kitchen Two tablets by mouth two times a day    Actos 30 Mg Tabs (Pioglitazone hcl) .Marland Kitchen... 1 tablet by mouth daily for blood sugar **note change in dose**    Aspir-low 81 Mg Tbec (Aspirin) ..... One tablet by mouth  daily  Orders: Capillary Blood Glucose/CBG [phone]) T- Hemoglobin A1C (78295-62130) T-Comprehensive Metabolic Panel (86578-46962) T-CBC w/Diff (95284-13244) T-TSH (01027-25366) T-Urine Microalbumin w/creat. ratio ([phone]) UA Dipstick w/o Micro (manual) (75643)  Complete Medication List: 1)  Ferrous Sulfate 325 (65 Fe) Mg Tabs (Ferrous sulfate) .Marland Kitchen.. 1 tablet po each  day 2)  Glucovance 5-500 Mg Tabs (Glyburide-metformin) .... Two tablets by mouth two times a day 3)  Crestor 20 Mg Tabs (Rosuvastatin calcium) .... One tablet by mouth nightly for cholesterol  **note dose increase** 4)  Actos 30 Mg Tabs (Pioglitazone hcl) .Marland Kitchen.. 1 tablet by mouth daily for blood sugar **note change in dose** 5)  Arthrotec 75 75-200 Mg-mcg Tabs (Diclofenac-misoprostol) .... One tablet by mouth two times a day for inflammation in hip & knee 6)  Aspir-low 81 Mg Tbec (Aspirin) .... One tablet by mouth daily 7)  Triamcinolone Acetonide 0.1 % Crea (Triamcinolone acetonide) .... One application topically two times a day to affected area  Patient Instructions: 1)  Rash - looks like eczema.  Read the handout 2)  [facility]. 3)  Avoid any scented lotions or soap 4)  Sleep - Read the handout on sleep and be sure that you are not doing anything to affect your sleep. 5)  You may take benadryl as needed for sleep.  This will also help with itching. 6)  Follow up with n.Martin,fnp in 4 weeks.  will need cbg, u/a, microalbumin.  Will assess rash and sleep. Prescriptions: TRIAMCINOLONE ACETONIDE 0.1 % CREA (TRIAMCINOLONE ACETONIDE) One application topically two times a day to affected area  #60gm x 0   Entered and Authorized by:   Lehman Prom FNP   Signed by:   Lehman Prom FNP on 08/26/2010   Method used:   Faxed to ...       [facility] (retail)       [address] [place], [place]  [address]       Ph: [phone]        Fax: [phone]   RxID:   [id number]    Orders Added: 1)  Capillary Blood Glucose/CBG [82948] 2)  Est. Patient Level IV [99214] 3)  T- Hemoglobin A1C [83036-23375] 4)  T-Comprehensive Metabolic Panel [80053-22900] 5)  T-CBC w/Diff [62831-51761] 6)  T-TSH [60737-10626] 7)  T-Urine Microalbumin w/creat. ratio [82043-[address]] 8)  UA Dipstick w/o Micro (manual) [81002]    Laboratory Results   Urine Tests  Date/Time Received: August 26, 2010 4:11 PM   Routine Urinalysis   Color: lt. yellow Appearance: Clear Glucose: negative   (Normal Range: Negative) Bilirubin: negative   (Normal Range: Negative) Ketone:  negative   (Normal Range: Negative) Spec. Gravity: 1.010   (Normal Range: 1.003-1.035) Blood: negative   (Normal Range: Negative) pH: 5.0   (Normal Range: 5.0-8.0) Protein: negative   (Normal Range: Negative) Urobilinogen: 0.2   (Normal Range: 0-1) Nitrite: negative   (Normal Range: Negative) Leukocyte Esterace: negative   (Normal Range: Negative)     Blood Tests     CBG Random:: 106

## 2010-09-03 NOTE — Letter (Signed)
Summary: *HSN Results Follow up  [facility]  [address] [place], [place] [address]   Phone: [phone]  Fax: [phone]      08/28/2010   Meredith Ryan [address] [place], [place]  [address]   Dear  Meredith Ryan Plan,                            ____S.Drinkard,FNP   ____D. Gore,FNP       ____B. McPherson,MD   ____V. Rankins,MD    ____E. Mulberry,MD    __X__[name], FNP  ____D. Reche Dixon, MD    ____K. Philipp Deputy, MD    ____Other     This letter is to inform you that your recent test(s):  _______Pap Smear    ___X____Lab Test     _______X-ray    ___X____ is within acceptable limits  _______ requires a medication change  _______ requires a follow-up lab visit  _______ requires a follow-up visit with your provider   Comments:  Labs done during recent office visit are normal. Your Hgba1c = 7.1.  Blood sugar is doing ok.  Keep taking medications as ordered.       _________________________________________________________ If you have any questions, please contact our office [phone].                    Sincerely,    Meredith Prom FNP [facility]

## 2010-09-03 NOTE — Letter (Signed)
Summary: Handout Printed  Printed Handout:  - Insomnia 

## 2010-09-23 LAB — CULTURE, ROUTINE-ABSCESS

## 2011-05-20 ENCOUNTER — Other Ambulatory Visit (HOSPITAL_COMMUNITY): Payer: Self-pay | Admitting: Family Medicine

## 2011-05-20 DIAGNOSIS — Z1231 Encounter for screening mammogram for malignant neoplasm of breast: Secondary | ICD-10-CM

## 2011-06-24 ENCOUNTER — Ambulatory Visit (HOSPITAL_COMMUNITY)
Admission: RE | Admit: 2011-06-24 | Discharge: 2011-06-24 | Disposition: A | Discharge status: Home / Self Care | Payer: Self-pay | Source: Ambulatory Visit | Attending: Family Medicine | Admitting: Family Medicine

## 2011-06-24 DIAGNOSIS — Z1231 Encounter for screening mammogram for malignant neoplasm of breast: Secondary | ICD-10-CM | POA: Insufficient documentation

## 2012-05-05 ENCOUNTER — Encounter: Payer: Self-pay | Admitting: Family Medicine

## 2012-05-05 ENCOUNTER — Ambulatory Visit (INDEPENDENT_AMBULATORY_CARE_PROVIDER_SITE_OTHER): Payer: Self-pay | Admitting: Family Medicine

## 2012-05-05 VITALS — BP 144/84 | HR 99 | Temp 98.0°F | Ht 64.0 in | Wt 167.0 lb

## 2012-05-05 DIAGNOSIS — E119 Type 2 diabetes mellitus without complications: Secondary | ICD-10-CM

## 2012-05-05 DIAGNOSIS — B373 Candidiasis of vulva and vagina: Secondary | ICD-10-CM

## 2012-05-05 DIAGNOSIS — B3731 Acute candidiasis of vulva and vagina: Secondary | ICD-10-CM

## 2012-05-05 DIAGNOSIS — E785 Hyperlipidemia, unspecified: Secondary | ICD-10-CM

## 2012-05-05 DIAGNOSIS — D508 Other iron deficiency anemias: Secondary | ICD-10-CM

## 2012-05-05 MED ORDER — LISINOPRIL 5 MG PO TABS: 2.5000 mg | ORAL_TABLET | Freq: Every day | ORAL | Status: DC

## 2012-05-05 MED ORDER — FERROUS SULFATE 325 (65 FE) MG PO TABS: 325.0000 mg | ORAL_TABLET | Freq: Every day | ORAL | Status: DC

## 2012-05-05 MED ORDER — METFORMIN HCL 1000 MG PO TABS: 1000.0000 mg | ORAL_TABLET | Freq: Two times a day (BID) | ORAL | Status: DC

## 2012-05-05 MED ORDER — GLIPIZIDE 5 MG PO TABS: 5.0000 mg | ORAL_TABLET | Freq: Two times a day (BID) | ORAL | Status: DC

## 2012-05-05 MED ORDER — NYSTATIN 100000 UNIT/GM EX CREA: TOPICAL_CREAM | Freq: Two times a day (BID) | CUTANEOUS | Status: DC

## 2012-05-05 MED ORDER — ROSUVASTATIN CALCIUM 20 MG PO TABS: 20.0000 mg | ORAL_TABLET | Freq: Every day | ORAL | Status: DC

## 2012-05-05 NOTE — Patient Instructions (Signed)
It was nice to meet you today! Everything looks great.   We will see you in clinic in 3 months and do labs then.   I have sent in your medications to [facility]. Let me know if you need anything!  Take care! Kashina Mecum M. Willi Borowiak, M.D.

## 2012-05-05 NOTE — Assessment & Plan Note (Signed)
Patient's last A1C was 7.1. States it is well controlled at home and she feels well. Continue current Metformin and Glipizide. Will RTC in 2-3 months for labs including A1C, microalbumin. Foot exam completed today. Needs diabetic eye exam. Also needs flu shot, although patient states she has already received it, I cannot find records.

## 2012-05-05 NOTE — Assessment & Plan Note (Signed)
Last LDL at goal <90. Continue Crestor since it is well tolerated. If there is financial restraints, can switch to Simvastatin. Recheck labs including direct LDL or fasting labs at next appointment.

## 2012-05-05 NOTE — Progress Notes (Signed)
Patient ID: Meredith Ryan, female   DOB: 08/27/1960, 51 y.o.   MRN: [id number] [name] Madalaine Portier M. Horice Carrero, MD Phone: [phone]  Subjective: HPI: Patient is a 51 y.o. female presenting to clinic today for new patient appointment. Previously seen by [facility] Concerns today include: None.  1. Diabetes: Type 2. Last A1C was 7.1% in June 2013 High at home: 160 Low at home: 140 Taking medications: Taking Metformin and Glipizide  Side effects: None ROS: denies fever, chills, dizziness, numbness or tingling in extremities or chest pain.  2. HLD- Taking Crestor, no problems taking at night. Last LDL was in August 2013 was 89. Does not diet. Walks daily around her house.  3. Anemia- Iron deficiency anemia. States she takes iron every day. Last HgB was 12.6 in June, but MCV low and total iron borderline low.   4. Yeast infection- Patient states that she intermittently has vaginal irritation and redness. She was previously given yeast cream by provider which helped greatly. Denies any other vaginal discharge, dysuria or sexual problems. 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  Past Medical History  Diagnosis Date  . Anemia   . DM type 2 (diabetes mellitus, type 2)   . HLD (hyperlipidemia)    Past surgical history: No history  History   Social History  . Marital Status: Married    Spouse Name: N/A    Number of Children: N/A  . Years of Education: N/A   Social History Main Topics  . Smoking status: Never Smoker   . Smokeless tobacco: Not on file  . Alcohol Use: No  . Drug Use: No  . Sexually Active: Not on file   Other Topics Concern  . Not on file   Social History Narrative   Lives with husband, 3 children. (23, 50, 22) Oldest daughter married. Was working at [employer], but laid off.   History Reviewed: Non smoker. Health Maintenance:   Last mammogram- Jan 2013  Last pap smear- ?August  2013  Flu shot - unsure  Tdap - unsure.  ROS: Please see HPI above.  Objective: Office vital signs reviewed.  Physical Examination:  General: Awake, alert. NAD. Very pleasant HEENT: Atraumatic, normocephalic. Head wrap on. Posterior pharynx clear Neck: No masses palpated. No LAD Pulm: CTAB, no wheezes. Good effort Cardio: RRR, no murmurs appreciated Abdomen:obese, +BS, soft, nontender, nondistended Extremities: No edema. Stockings in place Neuro: Grossly intact. See foot exam.  Assessment: 51 yo F new patient  Plan: See Problem List and After Visit Summary

## 2012-05-05 NOTE — Assessment & Plan Note (Signed)
Patient requesting prn cream to use for itching. 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 Most likely due to her DM. Will give Nystatin to use as needed, but if she finds she is requiring frequent use, she should come see me for pelvic exam to check since I can also give her a pill to take that will help with yeast. She agrees.

## 2012-05-05 NOTE — Assessment & Plan Note (Signed)
HgB normal, but still has low iron. Continue Ferrous sulfate 325mg  daily. Recheck CBC and iron level at next labs. Patient overall feels well.

## 2012-05-25 ENCOUNTER — Emergency Department (HOSPITAL_COMMUNITY)
Admission: EM | Admit: 2012-05-25 | Discharge: 2012-05-25 | Disposition: A | Discharge status: Home / Self Care | Payer: No Typology Code available for payment source | Source: Home / Self Care | Attending: Family Medicine | Admitting: Family Medicine

## 2012-05-25 ENCOUNTER — Encounter (HOSPITAL_COMMUNITY): Payer: Self-pay

## 2012-05-25 DIAGNOSIS — R002 Palpitations: Secondary | ICD-10-CM

## 2012-05-25 LAB — POCT I-STAT, CHEM 8
Calcium, Ion: 1.22 mmol/L (ref 1.12–1.23)
Chloride: 101 mEq/L (ref 96–112)
HCT: 42 % (ref 36.0–46.0)
Hemoglobin: 14.3 g/dL (ref 12.0–15.0)
Potassium: 4.3 mEq/L (ref 3.5–5.1)

## 2012-05-25 MED ORDER — ZOLPIDEM TARTRATE 10 MG PO TABS: 10.0000 mg | ORAL_TABLET | Freq: Every evening | ORAL | Status: DC | PRN

## 2012-05-25 NOTE — ED Notes (Signed)
Co/ heart palpatations and can  Not sleep past couple of days

## 2012-05-25 NOTE — ED Provider Notes (Signed)
History     CSN: [id number]  Arrival date & time 05/25/12  1730   First MD Initiated Contact with Patient 05/25/12 1854      Chief Complaint  Patient presents with  . Palpitations    heart palpatations and cant sleep    (Consider location/radiation/quality/duration/timing/severity/associated sxs/prior treatment) Patient is a 51 y.o. female presenting with palpitations. The history is provided by the patient and the spouse.  Palpitations  This is a recurrent problem. The current episode started yesterday (similar 2 yrs ago but resolved, recurred yest, none today.). The problem has not changed since onset.Associated symptoms comments: No other sx except not sleeping for couple nights..    Past Medical History  Diagnosis Date  . Anemia   . DM type 2 (diabetes mellitus, type 2)   . HLD (hyperlipidemia)     History reviewed. No pertinent past surgical history.  Family History  Problem Relation Age of Onset  . Diabetes Mother     History  Substance Use Topics  . Smoking status: Never Smoker   . Smokeless tobacco: Not on file  . Alcohol Use: No    OB History    Grav Para Term Preterm Abortions TAB SAB Ect Mult Living   5    1  1   4       Review of Systems  Constitutional: Negative.   Respiratory: Negative.   Cardiovascular: Positive for palpitations. Negative for leg swelling.    Allergies  Review of patient's allergies indicates no known allergies.  Home Medications   Current Outpatient Rx  Name  Route  Sig  Dispense  Refill  . ASPIRIN 81 MG PO TABS   Oral   Take 81 mg by mouth daily.         Marland Kitchen FERROUS SULFATE 325 (65 FE) MG PO TABS   Oral   Take 1 tablet (325 mg total) by mouth daily with breakfast.   30 tablet   5   . GLIPIZIDE 5 MG PO TABS   Oral   Take 1 tablet (5 mg total) by mouth 2 (two) times daily before a meal.   60 tablet   5   . LISINOPRIL 5 MG PO TABS   Oral   Take 0.5 tablets (2.5 mg total) by mouth daily.   30 tablet   5    . METFORMIN HCL 1000 MG PO TABS   Oral   Take 1 tablet (1,000 mg total) by mouth 2 (two) times daily with a meal.   60 tablet   5   . MULTI-VITAMIN/MINERALS PO TABS   Oral   Take 1 tablet by mouth daily.         Marland Kitchen ROSUVASTATIN CALCIUM 20 MG PO TABS   Oral   Take 1 tablet (20 mg total) by mouth daily.   30 tablet   5   . NYSTATIN 100000 UNIT/GM EX CREA   Topical   Apply topically 2 (two) times daily.   30 g   0   . ZOLPIDEM TARTRATE 10 MG PO TABS   Oral   Take 1 tablet (10 mg total) by mouth at bedtime as needed for sleep.   10 tablet   0     BP 134/89  Pulse 93  Temp 97.8 F (36.6 C) (Oral)  Resp 20  SpO2 100%  Physical Exam  Nursing note and vitals reviewed. Constitutional: She is oriented to person, place, and time. She appears well-developed and well-nourished. No distress.  HENT:  Head: Normocephalic.  Right Ear: External ear normal.  Left Ear: External ear normal.  Mouth/Throat: Oropharynx is clear and moist.  Eyes: Conjunctivae normal are normal. Pupils are equal, round, and reactive to light.  Neck: Normal range of motion. Neck supple. No thyromegaly present.  Cardiovascular: Normal rate, regular rhythm, normal heart sounds and intact distal pulses.   Pulmonary/Chest: Effort normal and breath sounds normal.  Abdominal: Bowel sounds are normal.  Neurological: She is alert and oriented to person, place, and time.  Skin: Skin is warm and dry.    ED Course  Procedures (including critical care time)  Labs Reviewed  POCT I-STAT, CHEM 8 - Abnormal; Notable for the following:    Glucose, Bld 156 (*)     All other components within normal limits   No results found.   1. Palpitations       MDM  ecg--wnl i-stat glu 156 otherwise nl.        Linna Hoff, MD 05/25/12 [id number]

## 2012-06-07 ENCOUNTER — Emergency Department (HOSPITAL_COMMUNITY)
Admission: EM | Admit: 2012-06-07 | Discharge: 2012-06-07 | Discharge status: Absent without leave | Payer: Self-pay | Source: Home / Self Care

## 2012-06-07 ENCOUNTER — Other Ambulatory Visit: Payer: Self-pay | Admitting: Family Medicine

## 2012-07-15 ENCOUNTER — Ambulatory Visit (INDEPENDENT_AMBULATORY_CARE_PROVIDER_SITE_OTHER): Payer: No Typology Code available for payment source | Admitting: Family Medicine

## 2012-07-15 ENCOUNTER — Encounter: Payer: Self-pay | Admitting: Family Medicine

## 2012-07-15 VITALS — BP 138/84 | HR 87 | Temp 98.4°F | Ht 64.0 in | Wt 169.0 lb

## 2012-07-15 DIAGNOSIS — D508 Other iron deficiency anemias: Secondary | ICD-10-CM

## 2012-07-15 DIAGNOSIS — E785 Hyperlipidemia, unspecified: Secondary | ICD-10-CM

## 2012-07-15 DIAGNOSIS — G479 Sleep disorder, unspecified: Secondary | ICD-10-CM

## 2012-07-15 DIAGNOSIS — E119 Type 2 diabetes mellitus without complications: Secondary | ICD-10-CM

## 2012-07-15 LAB — ANEMIA PANEL
%SAT: 23 % (ref 20–55)
Ferritin: 103 ng/mL (ref 10–291)
Folate: >20 ng/mL
TIBC: 288 ug/dL (ref 250–470)
UIBC: 223 ug/dL (ref 125–400)
Vitamin B-12: 648 pg/mL (ref 211–911)

## 2012-07-15 LAB — COMPREHENSIVE METABOLIC PANEL
ALT: 14 U/L (ref 0–35)
AST: 17 U/L (ref 0–37)
Albumin: 4.3 g/dL (ref 3.5–5.2)
BUN: 13 mg/dL (ref 6–23)
Calcium: 9.5 mg/dL (ref 8.4–10.5)
Chloride: 103 mEq/L (ref 96–112)
Potassium: 4.5 mEq/L (ref 3.5–5.3)
Sodium: 138 mEq/L (ref 135–145)
Total Protein: 7.4 g/dL (ref 6.0–8.3)

## 2012-07-15 LAB — LIPID PANEL
HDL: 38 mg/dL — ABNORMAL LOW (ref >39)
Total CHOL/HDL Ratio: 6.5 Ratio
VLDL: 29 mg/dL (ref 0–40)

## 2012-07-15 LAB — CBC
HCT: 38.1 % (ref 36.0–46.0)
RDW: 17.1 % — ABNORMAL HIGH (ref 11.5–15.5)
WBC: 6.5 10*3/uL (ref 4.0–10.5)

## 2012-07-15 MED ORDER — TRAZODONE HCL 50 MG PO TABS: 25.0000 mg | ORAL_TABLET | Freq: Every evening | ORAL | Status: DC | PRN

## 2012-07-15 MED ORDER — METFORMIN HCL 1000 MG PO TABS: 1000.0000 mg | ORAL_TABLET | Freq: Two times a day (BID) | ORAL | Status: DC

## 2012-07-15 MED ORDER — LOVASTATIN 20 MG PO TABS: 20.0000 mg | ORAL_TABLET | Freq: Every day | ORAL | Status: DC

## 2012-07-15 MED ORDER — GLIPIZIDE 5 MG PO TABS: 5.0000 mg | ORAL_TABLET | Freq: Two times a day (BID) | ORAL | Status: DC

## 2012-07-15 MED ORDER — LISINOPRIL 5 MG PO TABS: 2.5000 mg | ORAL_TABLET | Freq: Every day | ORAL | Status: DC

## 2012-07-15 NOTE — Progress Notes (Signed)
Patient ID: Meredith Ryan, female   DOB: 1961/05/24, 52 y.o.   MRN: [id number]  [name] Shaylynne Lunt M. Berish Bohman, MD Phone: [phone]   Subjective: HPI: Patient is a 52 y.o. female presenting to clinic today for follow up appointment. Concerns today include palpitations, not able to sleep, knee pain  1. Diabetes:  High at home: 180 Low at home: No symptomatic lows Taking medications: Yes Side effects:None ROS: denies fever, chills, dizziness, LOC, polyuria, polydipsia, numbness or tingling in extremities or chest pain.  2. HLD- Previously on Crestor, but not taking because of cost. No changes in diet recently. Unsure the last time she took medication. She has lost about 20lbs in the last one year. She would be interested in switching to a less expensive medication. She has not eaten today, but did have some tea for breakfast.  3. Palpitations- Feels heart racing and beating hard at night time while she is not sleeping at night. Feels it every night. No HA, no dizziness, no CP, no N/V associated with it. Does not notice it at all during day time. Does not feel it with exercise.  4. Insomnia- Has not been able to sleep well in 2 months. Not working now, not feeling more stress.  More alert and aware of surroundings at night. Reports difficulty fallign asleep and staying asleep. Has tried Ambien in the past with good results. No nightmares.  History Reviewed: Non-smoker. Health Maintenance: Pap smear- unsure. Did not take flu shot. Mammogram is due now.  ROS: Please see HPI above.  Objective: Office vital signs reviewed. There were no vitals taken for this visit.  Physical Examination:  General: Awake, alert. NAD. Very pleasant Pulm: CTAB, no wheezes Cardio: RRR, no murmurs appreciated. S1/S2 normal. Abdomen:+BS, soft, nontender, nondistended Extremities: No edema. TTP medial aspect right knee Neuro: Grossly intact  Assessment: 52 y.o. female follow up  appointment  Plan: See Problem List and After Visit Summary'

## 2012-07-15 NOTE — Assessment & Plan Note (Addendum)
CBG appear well controlled. Will check A1C, microalbumin, Cmet today. Pt will continue current Glipizide, and Metformin was refilled as well. RTC in 3 months for follow up. Foot exam done in Nov 2013; will also need eye exam soon. Also, restart Lisinopril 2.5mg ; refills sent.

## 2012-07-15 NOTE — Assessment & Plan Note (Signed)
Has been out of Crestor due to cost. Will check fasting lipids today. Lovastatin sent to [facility]. Pt will take 20mg  daily for now. F/u in 3 months. Will need to see if she is tolerated Rx well and consider repeat LFT at that time after starting new statin.

## 2012-07-15 NOTE — Patient Instructions (Signed)
It was good to see you today!  I have sent all of your prescriptions in to the [facility].  For sleep, taking 1/2 tablet as needed to help you rest.  Your heart sounds normal today, I think you may be more aware of the heartbeat when you can't sleep.  For your knee, keep taking the Arthrotec. You can also use Aspercream (on the shelf with other muscle rubs) to rub on your knee.  We will check labs today. I will call you if anything looks abnormal or send you a letter if everything looks ok!  Please come back to see me in 3 months, or sooner if you need anything! Tajae Maiolo M. Kaj Vasil, M.D.

## 2012-07-15 NOTE — Assessment & Plan Note (Signed)
Continue Arthrotec. Can also use OTC Aspercream as needed for symptomatic relief.

## 2012-07-15 NOTE — Assessment & Plan Note (Signed)
Taking iron daily. Will check CBC and anemia panel today. Continue iron supplementation.

## 2012-07-15 NOTE — Assessment & Plan Note (Signed)
Will start Trazodone 12.5mg  nightly for sleep, only as needed. F/u in 3 months.

## 2012-07-16 ENCOUNTER — Encounter: Payer: Self-pay | Admitting: Family Medicine

## 2012-10-12 ENCOUNTER — Ambulatory Visit (INDEPENDENT_AMBULATORY_CARE_PROVIDER_SITE_OTHER): Payer: No Typology Code available for payment source | Admitting: Family Medicine

## 2012-10-12 VITALS — BP 139/83 | HR 94 | Wt 164.8 lb

## 2012-10-12 DIAGNOSIS — R070 Pain in throat: Secondary | ICD-10-CM

## 2012-10-12 MED ORDER — LOVASTATIN 20 MG PO TABS: 20.0000 mg | ORAL_TABLET | Freq: Every day | ORAL | Status: DC

## 2012-10-12 MED ORDER — OMEPRAZOLE 20 MG PO CPDR: 20.0000 mg | DELAYED_RELEASE_CAPSULE | Freq: Every day | ORAL | Status: DC

## 2012-10-12 MED ORDER — FLUTICASONE PROPIONATE 50 MCG/ACT NA SUSP: 2.0000 | Freq: Every day | NASAL | Status: DC

## 2012-10-12 MED ORDER — LISINOPRIL 5 MG PO TABS: 2.5000 mg | ORAL_TABLET | Freq: Every day | ORAL | Status: DC

## 2012-10-12 MED ORDER — GLIPIZIDE 5 MG PO TABS: 5.0000 mg | ORAL_TABLET | Freq: Two times a day (BID) | ORAL | Status: DC

## 2012-10-12 MED ORDER — TRAZODONE HCL 50 MG PO TABS: 25.0000 mg | ORAL_TABLET | Freq: Every evening | ORAL | Status: DC | PRN

## 2012-10-12 MED ORDER — FERROUS SULFATE 325 (65 FE) MG PO TABS: 325.0000 mg | ORAL_TABLET | Freq: Every day | ORAL | Status: DC

## 2012-10-12 MED ORDER — METFORMIN HCL 1000 MG PO TABS: 1000.0000 mg | ORAL_TABLET | Freq: Two times a day (BID) | ORAL | Status: DC

## 2012-10-12 NOTE — Assessment & Plan Note (Signed)
Based on history and exam, think could be due to a few things. Most likely causes are seasonal allergies causing post-nasal drip or GERD causing the sensation of a lump in her throat making her cough. Discussed this with patient. Will prescribe Flonase for allergies and Prilosec for GERD. She should take both of these for the next 4 weeks and RTC for follow up.  Also, I think her gradual weight loss is due to her dysphagia, but would likely benefit from EGD if it persists. Patient has orange card which will make this more difficult. Will reassess weight at next visit.

## 2012-10-12 NOTE — Patient Instructions (Addendum)
We will try a couple of things to help with your scratchy throat. It could be caused by acid reflux coming up from your stomach. This can cause you have the choking sensation. Also, allergies can cause your throat to be scratchy, even without other symptoms.   I have given you 2 new medications. Prilosec for your stomach and Flonase nasal spray for your nasal drainage. I sent these to [facility], but the health department has those also. We will see you back in one month to see how things are going and to see if you need to see a specialist.  Take care! Genee Rann M. Chaneka Trefz, M.D.     Gastroesophageal Reflux Disease, Adult Gastroesophageal reflux disease (GERD) happens when acid from your stomach goes into your food pipe (esophagus). The acid can cause a burning feeling in your chest. Over time, the acid can make small holes (ulcers) in your food pipe.  HOME CARE  Ask your doctor for advice about:  Losing weight.  Quitting smoking.  Alcohol use.  Avoid foods and drinks that make your problems worse. You may want to avoid:  Caffeine and alcohol.  Chocolate.  Mints.  Garlic and onions.  Spicy foods.  Citrus fruits, such as oranges, lemons, or limes.  Foods that contain tomato, such as sauce, chili, salsa, and pizza.  Fried and fatty foods.  Avoid lying down for 3 hours before you go to bed or before you take a nap.  Eat small meals often, instead of large meals.  Wear loose-fitting clothing. Do not wear anything tight around your waist.  Raise (elevate) the head of your bed 6 to 8 inches with wood blocks. Using extra pillows does not help.  Only take medicines as told by your doctor.  Do not take aspirin or ibuprofen. GET HELP RIGHT AWAY IF:   You have pain in your arms, neck, jaw, teeth, or back.  Your pain gets worse or changes.  You feel sick to your stomach (nauseous), throw up (vomit), or sweat (diaphoresis).  You feel short of breath, or you pass out  (faint).  Your throw up is green, yellow, black, or looks like coffee grounds or blood.  Your poop (stool) is red, bloody, or black. MAKE SURE YOU:   Understand these instructions.  Will watch your condition.  Will get help right away if you are not doing well or get worse. Document Released: 11/19/2007 Document Revised: 08/25/2011 Document Reviewed: 12/20/2010 [facility] Patient Information 2013 [place], [place].

## 2012-10-12 NOTE — Progress Notes (Signed)
Patient ID: Meredith Ryan, female   DOB: 1960/10/07, 52 y.o.   MRN: [id number]  [facility] Meredith Ryan M. Jamaree Hosier, MD Phone: [phone]   Subjective: HPI: Patient is a 52 y.o. female presenting to clinic today for follow up appointment. Concerns today include scratchy throat and weight loss  1. Scratchy throat- Been going on greater than 3 months. She has tried throat spray and drinking water. Gets hoarse with talking and difficulty swallowing when eating. No seasonal allergies. No sore throat. Not a lot of coughing. Loses voice at times. Has sensation of something being stuck in throat. Never had anything like this before.  2. Weight loss- Lost total of 10lbs, 5lbs in last 3 months. No changes to diet, but not able to eat as much with sore throat.  History Reviewed: Non- smoker. Health Maintenance: UTD  ROS: Please see HPI above.  Objective: Office vital signs reviewed. BP 139/83  Pulse 94  Wt 164 lb 12.8 oz (74.753 kg)  BMI 28.27 kg/m2  Physical Examination:  General: Awake, alert. NAD HEENT: Atraumatic, normocephalic. Nose wnl. Posterior pharynx erythematous with some cobblestoning. Neck: No masses palpated. No LAD Pulm: CTAB, no wheezes Cardio: RRR, no murmurs appreciated Abdomen:+BS, soft, nontender, nondistended Extremities: No edema Neuro: Grossly intact  Assessment: 52 y.o. female with throat pain  Plan: See Problem List and After Visit Summary

## 2013-03-16 ENCOUNTER — Ambulatory Visit: Payer: Self-pay | Admitting: Family Medicine

## 2013-03-21 ENCOUNTER — Encounter: Payer: Self-pay | Admitting: Family Medicine

## 2013-03-21 ENCOUNTER — Ambulatory Visit (INDEPENDENT_AMBULATORY_CARE_PROVIDER_SITE_OTHER): Payer: Self-pay | Admitting: Family Medicine

## 2013-03-21 VITALS — BP 137/87 | HR 102 | Temp 98.4°F | Ht 64.0 in | Wt 166.0 lb

## 2013-03-21 DIAGNOSIS — E119 Type 2 diabetes mellitus without complications: Secondary | ICD-10-CM

## 2013-03-21 DIAGNOSIS — Z1384 Encounter for screening for dental disorders: Secondary | ICD-10-CM

## 2013-03-21 DIAGNOSIS — Z1389 Encounter for screening for other disorder: Secondary | ICD-10-CM

## 2013-03-21 DIAGNOSIS — R002 Palpitations: Secondary | ICD-10-CM

## 2013-03-21 DIAGNOSIS — G479 Sleep disorder, unspecified: Secondary | ICD-10-CM

## 2013-03-21 DIAGNOSIS — Z23 Encounter for immunization: Secondary | ICD-10-CM

## 2013-03-21 LAB — POCT GLYCOSYLATED HEMOGLOBIN (HGB A1C): Hemoglobin A1C: 6.7

## 2013-03-21 MED ORDER — LOVASTATIN 20 MG PO TABS: 20.0000 mg | ORAL_TABLET | Freq: Every day | ORAL | Status: DC

## 2013-03-21 MED ORDER — METOPROLOL TARTRATE 25 MG PO TABS: 25.0000 mg | ORAL_TABLET | Freq: Two times a day (BID) | ORAL | Status: DC

## 2013-03-21 MED ORDER — METFORMIN HCL 1000 MG PO TABS: 1000.0000 mg | ORAL_TABLET | Freq: Two times a day (BID) | ORAL | Status: DC

## 2013-03-21 MED ORDER — LISINOPRIL 5 MG PO TABS: 2.5000 mg | ORAL_TABLET | Freq: Every day | ORAL | Status: DC

## 2013-03-21 MED ORDER — GLIPIZIDE 5 MG PO TABS: 5.0000 mg | ORAL_TABLET | Freq: Two times a day (BID) | ORAL | Status: DC

## 2013-03-21 NOTE — Progress Notes (Signed)
Patient ID: Meredith Ryan, female   DOB: 1960/12/11, 52 y.o.   MRN: [id number]  [name] Nettye Flegal M. Daquana Paddock, MD Phone: [phone]   Subjective: HPI: Patient is a 52 y.o. female presenting to clinic today for follow up appointment. Concerns today include feeling palpitations  1. Diabetes:  High at home: 140 Low at home: No symptomatic lows Taking medications:Glipizide, Metformin. 1/2 Aspirin daily Side effects: None ROS: denies fever, chills, dizziness, LOC, polyuria, polydipsia, numbness or tingling in extremities or chest pain. Last eye exam: > 15yr. Wants Last foot exam: Today Nephropathy screen indicated?: On Lisinopril  2. Palpitations: Feels it in the middle of night when she rolls over or works hard. No syncope, no CP, no SOB. This has been going on for a few months. Husband states it is more prominent with sexual relations and she complains of palpitations and SOB. She is tachycardic today but does not feel any abnormal beats.  3. Insomnia: Previously on Trazodone qhs. Still has some available at home but does not want to take it because she sleeps ok at times.   History Reviewed: Non-smoker. Health Maintenance: Needs Flu shot. Requesting dental referral for routine screening.  ROS: Please see HPI above.  Objective: Office vital signs reviewed. BP 137/87  Pulse 102  Temp(Src) 98.4 F (36.9 C) (Oral)  Ht 5\' 4"  (1.626 m)  Wt 166 lb (75.297 kg)  BMI 28.48 kg/m2  Physical Examination:  General: Awake, alert. NAD HEENT: Atraumatic, normocephalic. Head covered. MMM. Pulm: CTAB, no wheezes Cardio: RRR, no murmurs appreciated Abdomen:+BS, soft, nontender, nondistended Extremities: No edema. Foot exam WNL. Henna on toes and fingers. Neuro: Grossly intact  Assessment: 52 y.o. female follow up  Plan: See Problem List and After Visit Summary

## 2013-03-21 NOTE — Patient Instructions (Addendum)
Start taking the Metoprolol twice daily for your heart rate. Let me know if you have any problems.  We will send in a referral for the dentist.  I will see you back in 3 months for a check up and labs.  Amber M. Hairford, M.D.

## 2013-03-21 NOTE — Assessment & Plan Note (Signed)
Tachycardic today to 102. BP slightly elevated. Will add low dose beta blocker to see if that will improve her palpitations. If continues, RTC for EKG and further work up. Patient agrees.

## 2013-03-21 NOTE — Assessment & Plan Note (Addendum)
A1C at goal. Foot exam within normal limits. Eye exam performed today. Con't Glipizide and Metformin. F/u 3 months with fasting labs.

## 2013-03-21 NOTE — Assessment & Plan Note (Signed)
She can use Trazodone prn, but states she does not want to. Sleeps well some nights. F/u as needed.

## 2013-03-25 ENCOUNTER — Telehealth: Payer: Self-pay | Admitting: Family Medicine

## 2013-03-25 NOTE — Telephone Encounter (Signed)
Pt has appt with financial advisor on 04/01/2013.  Pt was made aware that she must get orange card BEFORE referral can be made. Romani Wilbon, Maryjo [place]

## 2013-03-25 NOTE — Telephone Encounter (Signed)
Patient need dental referral to Dental Clinic for a cavity.

## 2013-04-01 ENCOUNTER — Ambulatory Visit: Payer: No Typology Code available for payment source | Attending: Internal Medicine

## 2013-05-03 ENCOUNTER — Other Ambulatory Visit (INDEPENDENT_AMBULATORY_CARE_PROVIDER_SITE_OTHER): Payer: No Typology Code available for payment source | Admitting: *Deleted

## 2013-05-03 DIAGNOSIS — E119 Type 2 diabetes mellitus without complications: Secondary | ICD-10-CM

## 2013-05-16 ENCOUNTER — Ambulatory Visit: Payer: No Typology Code available for payment source | Attending: Internal Medicine | Admitting: Internal Medicine

## 2013-05-16 ENCOUNTER — Encounter: Payer: Self-pay | Admitting: Internal Medicine

## 2013-05-16 VITALS — BP 110/74 | HR 70 | Temp 97.2°F | Resp 16 | Ht 64.0 in | Wt 165.4 lb

## 2013-05-16 DIAGNOSIS — R5381 Other malaise: Secondary | ICD-10-CM

## 2013-05-16 DIAGNOSIS — E785 Hyperlipidemia, unspecified: Secondary | ICD-10-CM

## 2013-05-16 DIAGNOSIS — E119 Type 2 diabetes mellitus without complications: Secondary | ICD-10-CM | POA: Insufficient documentation

## 2013-05-16 DIAGNOSIS — D509 Iron deficiency anemia, unspecified: Secondary | ICD-10-CM | POA: Insufficient documentation

## 2013-05-16 DIAGNOSIS — Z139 Encounter for screening, unspecified: Secondary | ICD-10-CM

## 2013-05-16 DIAGNOSIS — D508 Other iron deficiency anemias: Secondary | ICD-10-CM

## 2013-05-16 DIAGNOSIS — R5383 Other fatigue: Secondary | ICD-10-CM

## 2013-05-16 DIAGNOSIS — Z23 Encounter for immunization: Secondary | ICD-10-CM

## 2013-05-16 LAB — CBC WITH DIFFERENTIAL/PLATELET
Basophils Relative: 0 % (ref 0–1)
Eosinophils Absolute: 0.2 10*3/uL (ref 0.0–0.7)
Eosinophils Relative: 4 % (ref 0–5)
Lymphs Abs: 2.9 10*3/uL (ref 0.7–4.0)
MCH: 24.2 pg — ABNORMAL LOW (ref 26.0–34.0)
MCHC: 33.1 g/dL (ref 30.0–36.0)
MCV: 73.1 fL — ABNORMAL LOW (ref 78.0–100.0)
Monocytes Relative: 8 % (ref 3–12)
Neutrophils Relative %: 46 % (ref 43–77)
Platelets: 362 10*3/uL (ref 150–400)

## 2013-05-16 LAB — COMPLETE METABOLIC PANEL WITH GFR
Alkaline Phosphatase: 77 U/L (ref 39–117)
CO2: 27 mEq/L (ref 19–32)
Creat: 0.68 mg/dL (ref 0.50–1.10)
GFR, Est African American: >89 mL/min
GFR, Est Non African American: >89 mL/min
Glucose, Bld: 75 mg/dL (ref 70–99)
Sodium: 134 mEq/L — ABNORMAL LOW (ref 135–145)
Total Bilirubin: 0.3 mg/dL (ref 0.3–1.2)
Total Protein: 7.8 g/dL (ref 6.0–8.3)

## 2013-05-16 LAB — LIPID PANEL
Cholesterol: 215 mg/dL — ABNORMAL HIGH (ref 0–200)
HDL: 42 mg/dL (ref >39)
Triglycerides: 252 mg/dL — ABNORMAL HIGH (ref <150)

## 2013-05-16 LAB — GLUCOSE, POCT (MANUAL RESULT ENTRY): POC Glucose: 114 mg/dl — AB (ref 70–99)

## 2013-05-16 NOTE — Progress Notes (Signed)
Patient here because she has been fatigued For the past month. Has trouble sleeping and a decreased appetite

## 2013-05-16 NOTE — Progress Notes (Signed)
Patient ID: Meredith Ryan, female   DOB: 1960/12/27, 52 y.o.   MRN: [id number] MRN: [id number] Name: Meredith Ryan  Sex: female Age: 52 y.o. DOB: January 22, 1961  Allergies: Review of patient's allergies indicates no known allergies.  Chief Complaint  Patient presents with  . Diabetes    HPI: Patient is 52 y.o. female who comes for a stent establish medical care, she is history of diabetes hypertension hyperlipidemia anemia, she complains of feeling fatigued recently and decreased appetite, denies any change in bowel habits denies any bleeding, she is compliant with her diabetes medication and it is well controlled denies any hypoglycemic symptoms.  Past Medical History  Diagnosis Date  . Anemia   . DM type 2 (diabetes mellitus, type 2)   . HLD (hyperlipidemia)     History reviewed. No pertinent past surgical history.    Medication List       This list is accurate as of: 05/16/13  4:05 PM.  Always use your most recent med list.               ARTHROTEC 75-0.2 MG Tbec  Generic drug:  Diclofenac-Misoprostol  Take by mouth.     aspirin 81 MG tablet  Take 81 mg by mouth daily.     ferrous sulfate 325 (65 FE) MG tablet  Take 1 tablet (325 mg total) by mouth daily with breakfast.     fluticasone 50 MCG/ACT nasal spray  Commonly known as:  FLONASE  Place 2 sprays into the nose daily.     glipiZIDE 5 MG tablet  Commonly known as:  GLUCOTROL  Take 1 tablet (5 mg total) by mouth 2 (two) times daily before a meal.     lisinopril 5 MG tablet  Commonly known as:  PRINIVIL,ZESTRIL  Take 0.5 tablets (2.5 mg total) by mouth daily.     lovastatin 20 MG tablet  Commonly known as:  MEVACOR  Take 1 tablet (20 mg total) by mouth at bedtime.     metFORMIN 1000 MG tablet  Commonly known as:  GLUCOPHAGE  Take 1 tablet (1,000 mg total) by mouth 2 (two) times daily with a meal.     metoprolol tartrate 25 MG tablet  Commonly known as:  LOPRESSOR  Take 1 tablet (25 mg total) by mouth 2  (two) times daily.     multivitamin with minerals tablet  Take 1 tablet by mouth daily.     nystatin cream  Commonly known as:  MYCOSTATIN  APPLY TOPICALLY TWO TIMES A DAY     omeprazole 20 MG capsule  Commonly known as:  PRILOSEC  Take 1 capsule (20 mg total) by mouth daily.     traZODone 50 MG tablet  Commonly known as:  DESYREL  Take 0.5-1 tablets (25-50 mg total) by mouth at bedtime as needed for sleep.     zolpidem 10 MG tablet  Commonly known as:  AMBIEN  Take 1 tablet (10 mg total) by mouth at bedtime as needed for sleep.        No orders of the defined types were placed in this encounter.    Immunization History  Administered Date(s) Administered  . Influenza Whole 03/25/2006, 04/10/2006, 06/04/2007, 04/11/2008  . Influenza,inj,Quad PF,36+ Mos 03/21/2013, 05/16/2013  . Pneumococcal Polysaccharide-23 07/18/2005, 07/21/2005  . Td 07/18/2005, 07/21/2005    History  Substance Use Topics  . Smoking status: Never Smoker   . Smokeless tobacco: Not on file  . Alcohol Use: No    Review of Systems  As noted in HPI  Filed Vitals:   05/16/13 1537  BP: 110/74  Pulse: 70  Temp: 97.2 F (36.2 C)  Resp: 16    Physical Exam  Physical Exam  Constitutional: No distress.  Eyes: EOM are normal. Pupils are equal, round, and reactive to light.  Cardiovascular: Normal rate and regular rhythm.   Pulmonary/Chest: Breath sounds normal. No respiratory distress. She has no wheezes. She has no rales.  Musculoskeletal: She exhibits no edema.    CBC    Component Value Date/Time   WBC 6.5 07/15/2012 0933   RBC 5.07 07/15/2012 0933   RBC 5.07 07/15/2012 0933   HGB 12.4 07/15/2012 0933   HCT 38.1 07/15/2012 0933   PLT 336 07/15/2012 0933   MCV 75.1* 07/15/2012 0933   LYMPHSABS 2.3 08/27/2010 0009   MONOABS 0.4 08/27/2010 0009   EOSABS 0.1 08/27/2010 0009   BASOSABS 0.0 08/27/2010 0009    CMP     Component Value Date/Time   NA 138 07/15/2012 0933   K 4.5 07/15/2012 0933    CL 103 07/15/2012 0933   CO2 28 07/15/2012 0933   GLUCOSE 205* 07/15/2012 0933   BUN 13 07/15/2012 0933   CREATININE 0.76 07/15/2012 0933   CREATININE 0.70 05/25/2012 1919   CALCIUM 9.5 07/15/2012 0933   PROT 7.4 07/15/2012 0933   ALBUMIN 4.3 07/15/2012 0933   AST 17 07/15/2012 0933   ALT 14 07/15/2012 0933   ALKPHOS 83 07/15/2012 0933   BILITOT 0.3 07/15/2012 0933    Lab Results  Component Value Date/Time   CHOL 247* 07/15/2012  9:33 AM    No components found with this basename: hga1c    Lab Results  Component Value Date/Time   AST 17 07/15/2012  9:33 AM    Assessment and Plan  DM (diabetes mellitus) - Plan: Continue with her medications Glucose (CBG), HgB A1c 6.7%.  HYPERLIPIDEMIA - Plan: Will check Lipid panel  IRON DEFIC ANEMIA SEC DIET IRON INTAKE - Plan continue with iron supplement will check: CBC with Differential  Fatigue.. will check CBC and TSH level.   Screening - Plan: CBC with Differential, COMPLETE METABOLIC PANEL WITH GFR, TSH, Lipid panel, Vit D  25 hydroxy (rtn osteoporosis monitoring), Ambulatory referral to Gastroenterology  Need for prophylactic vaccination and inoculation against influenza   Health Maintenance -Colonoscopy: referred to GI    Return in about 6 weeks (around 06/27/2013).  Doris Cheadle, MD

## 2013-06-16 ENCOUNTER — Other Ambulatory Visit: Payer: Self-pay | Admitting: Family Medicine

## 2013-06-27 ENCOUNTER — Ambulatory Visit: Payer: No Typology Code available for payment source

## 2013-07-29 ENCOUNTER — Other Ambulatory Visit: Payer: Self-pay

## 2013-07-29 MED ORDER — METOPROLOL TARTRATE 25 MG PO TABS: 25.0000 mg | ORAL_TABLET | Freq: Two times a day (BID) | ORAL | Status: DC

## 2013-08-09 ENCOUNTER — Ambulatory Visit: Payer: Self-pay

## 2013-08-16 ENCOUNTER — Ambulatory Visit: Payer: Self-pay | Admitting: Internal Medicine

## 2013-08-17 ENCOUNTER — Ambulatory Visit: Payer: Self-pay

## 2013-08-23 ENCOUNTER — Ambulatory Visit: Payer: Medicaid Other | Attending: Internal Medicine

## 2013-08-30 ENCOUNTER — Encounter: Payer: Self-pay | Admitting: Internal Medicine

## 2013-08-30 ENCOUNTER — Ambulatory Visit: Payer: Medicaid Other | Attending: Internal Medicine | Admitting: Internal Medicine

## 2013-08-30 VITALS — BP 130/70 | HR 78 | Temp 97.7°F | Resp 17

## 2013-08-30 DIAGNOSIS — I10 Essential (primary) hypertension: Secondary | ICD-10-CM

## 2013-08-30 DIAGNOSIS — E785 Hyperlipidemia, unspecified: Secondary | ICD-10-CM

## 2013-08-30 DIAGNOSIS — K029 Dental caries, unspecified: Secondary | ICD-10-CM

## 2013-08-30 MED ORDER — LOVASTATIN 40 MG PO TABS: 20.0000 mg | ORAL_TABLET | Freq: Every day | ORAL | Status: DC

## 2013-08-30 NOTE — Patient Instructions (Signed)
DASH Diet  The DASH diet stands for "Dietary Approaches to Stop Hypertension." It is a healthy eating plan that has been shown to reduce high blood pressure (hypertension) in as little as 14 days, while also possibly providing other significant health benefits. These other health benefits include reducing the risk of breast cancer after menopause and reducing the risk of type 2 diabetes, heart disease, colon cancer, and stroke. Health benefits also include weight loss and slowing kidney failure in patients with chronic kidney disease.   DIET GUIDELINES  · Limit salt (sodium). Your diet should contain less than 1500 mg of sodium daily.  · Limit refined or processed carbohydrates. Your diet should include mostly whole grains. Desserts and added sugars should be used sparingly.  · Include small amounts of heart-healthy fats. These types of fats include nuts, oils, and tub margarine. Limit saturated and trans fats. These fats have been shown to be harmful in the body.  CHOOSING FOODS   The following food groups are based on a 2000 calorie diet. See your Registered Dietitian for individual calorie needs.  Grains and Grain Products (6 to 8 servings daily)  · Eat More Often: Whole-wheat bread, brown rice, whole-grain or wheat pasta, quinoa, popcorn without added fat or salt (air popped).  · Eat Less Often: White bread, white pasta, white rice, cornbread.  Vegetables (4 to 5 servings daily)  · Eat More Often: Fresh, frozen, and canned vegetables. Vegetables may be raw, steamed, roasted, or grilled with a minimal amount of fat.  · Eat Less Often/Avoid: Creamed or fried vegetables. Vegetables in a cheese sauce.  Fruit (4 to 5 servings daily)  · Eat More Often: All fresh, canned (in natural juice), or frozen fruits. Dried fruits without added sugar. One hundred percent fruit juice (½ cup [237 mL] daily).  · Eat Less Often: Dried fruits with added sugar. Canned fruit in light or heavy syrup.  Lean Meats, Fish, and Poultry (2  servings or less daily. One serving is 3 to 4 oz [85-114 g]).  · Eat More Often: Ninety percent or leaner ground beef, tenderloin, sirloin. Round cuts of beef, chicken breast, turkey breast. All fish. Grill, bake, or broil your meat. Nothing should be fried.  · Eat Less Often/Avoid: Fatty cuts of meat, turkey, or chicken leg, thigh, or wing. Fried cuts of meat or fish.  Dairy (2 to 3 servings)  · Eat More Often: Low-fat or fat-free milk, low-fat plain or light yogurt, reduced-fat or part-skim cheese.  · Eat Less Often/Avoid: Milk (whole, 2%). Whole milk yogurt. Full-fat cheeses.  Nuts, Seeds, and Legumes (4 to 5 servings per week)  · Eat More Often: All without added salt.  · Eat Less Often/Avoid: Salted nuts and seeds, canned beans with added salt.  Fats and Sweets (limited)  · Eat More Often: Vegetable oils, tub margarines without trans fats, sugar-free gelatin. Mayonnaise and salad dressings.  · Eat Less Often/Avoid: Coconut oils, palm oils, butter, stick margarine, cream, half and half, cookies, candy, pie.  FOR MORE INFORMATION  The Dash Diet Eating Plan: www.dashdiet.org  Document Released: 05/22/2011 Document Revised: 08/25/2011 Document Reviewed: 05/22/2011  [employer]® Patient Information ©2014 [employer].

## 2013-08-30 NOTE — Progress Notes (Signed)
Patient needs referral to dentist Has some broken teeth and needs general dentist

## 2013-08-30 NOTE — Progress Notes (Signed)
MRN: [id number] Name: Meredith Ryan  Sex: female Age: 53 y.o. DOB: [birth year]  Allergies: Review of patient's allergies indicates no known allergies.  Chief Complaint  Patient presents with  . DENTAL REFERRAL    HPI: Patient is 53 y.o. female who comes today reported to have lot of dental cavities and occasional dental pain, requesting referral to a dentist, she also history of diabetes hypertension hyperlipidemia, her previous blood work reviewed patient denies any hypoglycemic symptoms noticed elevated cholesterol, patient has been taking lovastatin 20 mg daily, initially her blood pressure was elevated, but repeat manual blood pressure is 130/70 she denies any acute symptoms.  Past Medical History  Diagnosis Date  . Anemia   . DM type 2 (diabetes mellitus, type 2)   . HLD (hyperlipidemia)     History reviewed. No pertinent past surgical history.    Medication List       This list is accurate as of: 08/30/13  3:49 PM.  Always use your most recent med list.               ARTHROTEC 75-0.2 MG Tbec  Generic drug:  Diclofenac-Misoprostol  Take by mouth.     aspirin 81 MG tablet  Take 81 mg by mouth daily.     ferrous sulfate 325 (65 FE) MG tablet  TAKE ONE TABLET BY MOUTH ONCE DAILY WITH  BREAKFAST     fluticasone 50 MCG/ACT nasal spray  Commonly known as:  FLONASE  Place 2 sprays into the nose daily.     glipiZIDE 5 MG tablet  Commonly known as:  GLUCOTROL  Take 1 tablet (5 mg total) by mouth 2 (two) times daily before a meal.     lisinopril 5 MG tablet  Commonly known as:  PRINIVIL,ZESTRIL  Take 0.5 tablets (2.5 mg total) by mouth daily.     lovastatin 40 MG tablet  Commonly known as:  MEVACOR  Take 0.5 tablets (20 mg total) by mouth at bedtime.     metFORMIN 1000 MG tablet  Commonly known as:  GLUCOPHAGE  Take 1 tablet (1,000 mg total) by mouth 2 (two) times daily with a meal.     metoprolol tartrate 25 MG tablet  Commonly known as:  LOPRESSOR    Take 1 tablet (25 mg total) by mouth 2 (two) times daily.     multivitamin with minerals tablet  Take 1 tablet by mouth daily.     nystatin cream  Commonly known as:  MYCOSTATIN  APPLY TOPICALLY TWO TIMES A DAY     omeprazole 20 MG capsule  Commonly known as:  PRILOSEC  Take 1 capsule (20 mg total) by mouth daily.     traZODone 50 MG tablet  Commonly known as:  DESYREL  Take 0.5-1 tablets (25-50 mg total) by mouth at bedtime as needed for sleep.     zolpidem 10 MG tablet  Commonly known as:  AMBIEN  Take 1 tablet (10 mg total) by mouth at bedtime as needed for sleep.        Meds ordered this encounter  Medications  . lovastatin (MEVACOR) 40 MG tablet    Sig: Take 0.5 tablets (20 mg total) by mouth at bedtime.    Dispense:  30 tablet    Refill:  5    Immunization History  Administered Date(s) Administered  . Influenza Whole 03/25/2006, 04/10/2006, 06/04/2007, 04/11/2008  . Influenza,inj,Quad PF,36+ Mos 03/21/2013, 05/16/2013  . Pneumococcal Polysaccharide-23 07/18/2005, 07/21/2005  . Td 07/18/2005, 07/21/2005  Family History  Problem Relation Age of Onset  . Diabetes Mother   . Diabetes Father     History  Substance Use Topics  . Smoking status: Never Smoker   . Smokeless tobacco: Not on file  . Alcohol Use: No    Review of Systems   As noted in HPI  Filed Vitals:   08/30/13 1546  BP: 130/70  Pulse:   Temp:   Resp:     Physical Exam  Physical Exam  Constitutional: No distress.  HENT:  Dental cavities   Eyes: EOM are normal. Pupils are equal, round, and reactive to light.  Cardiovascular: Normal rate and regular rhythm.   Pulmonary/Chest: Breath sounds normal. No respiratory distress. She has no wheezes. She has no rales.    CBC    Component Value Date/Time   WBC 6.9 05/16/2013 1601   RBC 5.54* 05/16/2013 1601   RBC 5.07 07/15/2012 0933   HGB 13.4 05/16/2013 1601   HCT 40.5 05/16/2013 1601   PLT 362 05/16/2013 1601   MCV 73.1*  05/16/2013 1601   LYMPHSABS 2.9 05/16/2013 1601   MONOABS 0.5 05/16/2013 1601   EOSABS 0.2 05/16/2013 1601   BASOSABS 0.0 05/16/2013 1601    CMP     Component Value Date/Time   NA 134* 05/16/2013 1601   K 4.6 05/16/2013 1601   CL 98 05/16/2013 1601   CO2 27 05/16/2013 1601   GLUCOSE 75 05/16/2013 1601   BUN 10 05/16/2013 1601   CREATININE 0.68 05/16/2013 1601   CREATININE 0.70 05/25/2012 1919   CALCIUM 9.8 05/16/2013 1601   PROT 7.8 05/16/2013 1601   ALBUMIN 4.5 05/16/2013 1601   AST 21 05/16/2013 1601   ALT 17 05/16/2013 1601   ALKPHOS 77 05/16/2013 1601   BILITOT 0.3 05/16/2013 1601    Lab Results  Component Value Date/Time   CHOL 215* 05/16/2013  4:01 PM    No components found with this basename: hga1c    Lab Results  Component Value Date/Time   AST 21 05/16/2013  4:01 PM    Assessment and Plan  Dental cavities - Plan: Ambulatory referral to Dentistry  Other and unspecified hyperlipidemia I have increased the dose of lovastatin 40 mg daily, repeat lipid panel on the next visit.   Essential hypertension, benign Advised patient for DASH diet continue with metoprolol and lisinopril.   Return in about 3 months (around 11/30/2013) for diabetes, hypertension, hyperipidemia.  Doris CheadleADVANI, Loveah Like, MD

## 2013-10-04 ENCOUNTER — Encounter: Payer: Self-pay | Admitting: Internal Medicine

## 2013-11-11 ENCOUNTER — Other Ambulatory Visit: Payer: Self-pay

## 2013-11-11 ENCOUNTER — Other Ambulatory Visit: Payer: Self-pay | Admitting: Family Medicine

## 2013-11-11 MED ORDER — METFORMIN HCL 1000 MG PO TABS: 1000.0000 mg | ORAL_TABLET | Freq: Two times a day (BID) | ORAL | Status: DC

## 2013-11-18 ENCOUNTER — Ambulatory Visit: Payer: No Typology Code available for payment source | Admitting: Internal Medicine

## 2013-12-20 ENCOUNTER — Other Ambulatory Visit: Payer: Self-pay | Admitting: Family Medicine

## 2014-01-23 ENCOUNTER — Other Ambulatory Visit: Payer: Self-pay | Admitting: Internal Medicine

## 2014-01-23 DIAGNOSIS — I10 Essential (primary) hypertension: Secondary | ICD-10-CM

## 2014-04-05 ENCOUNTER — Other Ambulatory Visit: Payer: Self-pay | Admitting: Internal Medicine

## 2014-04-17 ENCOUNTER — Encounter: Payer: Self-pay | Admitting: Internal Medicine

## 2014-04-21 ENCOUNTER — Other Ambulatory Visit: Payer: Self-pay

## 2014-05-10 ENCOUNTER — Other Ambulatory Visit: Payer: Self-pay | Admitting: Internal Medicine

## 2014-05-19 ENCOUNTER — Encounter: Payer: Self-pay | Admitting: Internal Medicine

## 2014-05-19 ENCOUNTER — Ambulatory Visit (HOSPITAL_BASED_OUTPATIENT_CLINIC_OR_DEPARTMENT_OTHER): Payer: Medicaid Other

## 2014-05-19 ENCOUNTER — Ambulatory Visit: Payer: Medicaid Other | Attending: Internal Medicine | Admitting: Internal Medicine

## 2014-05-19 VITALS — BP 126/85 | HR 90 | Temp 98.0°F | Resp 16

## 2014-05-19 DIAGNOSIS — Z139 Encounter for screening, unspecified: Secondary | ICD-10-CM

## 2014-05-19 DIAGNOSIS — Z7982 Long term (current) use of aspirin: Secondary | ICD-10-CM | POA: Insufficient documentation

## 2014-05-19 DIAGNOSIS — D649 Anemia, unspecified: Secondary | ICD-10-CM | POA: Insufficient documentation

## 2014-05-19 DIAGNOSIS — I10 Essential (primary) hypertension: Secondary | ICD-10-CM | POA: Insufficient documentation

## 2014-05-19 DIAGNOSIS — Z1231 Encounter for screening mammogram for malignant neoplasm of breast: Secondary | ICD-10-CM

## 2014-05-19 DIAGNOSIS — Z79899 Other long term (current) drug therapy: Secondary | ICD-10-CM | POA: Insufficient documentation

## 2014-05-19 DIAGNOSIS — E119 Type 2 diabetes mellitus without complications: Secondary | ICD-10-CM | POA: Insufficient documentation

## 2014-05-19 DIAGNOSIS — E139 Other specified diabetes mellitus without complications: Secondary | ICD-10-CM

## 2014-05-19 DIAGNOSIS — Z23 Encounter for immunization: Secondary | ICD-10-CM

## 2014-05-19 DIAGNOSIS — E785 Hyperlipidemia, unspecified: Secondary | ICD-10-CM | POA: Insufficient documentation

## 2014-05-19 LAB — GLUCOSE, POCT (MANUAL RESULT ENTRY): POC GLUCOSE: 84 mg/dL (ref 70–99)

## 2014-05-19 LAB — POCT GLYCOSYLATED HEMOGLOBIN (HGB A1C): Hemoglobin A1C: 6.9

## 2014-05-19 MED ORDER — MULTI-VITAMIN/MINERALS PO TABS: 1.0000 | ORAL_TABLET | Freq: Every day | ORAL | Status: DC

## 2014-05-19 MED ORDER — METFORMIN HCL 1000 MG PO TABS: 1000.0000 mg | ORAL_TABLET | Freq: Two times a day (BID) | ORAL | Status: DC

## 2014-05-19 MED ORDER — GLIPIZIDE 5 MG PO TABS: 5.0000 mg | ORAL_TABLET | Freq: Two times a day (BID) | ORAL | Status: DC

## 2014-05-19 MED ORDER — METOPROLOL TARTRATE 25 MG PO TABS: ORAL_TABLET | ORAL | Status: DC

## 2014-05-19 MED ORDER — SIMVASTATIN 10 MG PO TABS: 10.0000 mg | ORAL_TABLET | Freq: Every day | ORAL | Status: DC

## 2014-05-19 MED ORDER — LISINOPRIL 5 MG PO TABS: 2.5000 mg | ORAL_TABLET | Freq: Every day | ORAL | Status: DC

## 2014-05-19 NOTE — Progress Notes (Signed)
MRN: [id number] Name: Meredith Ryan  Sex: female Age: 53 y.o. DOB: 1960/09/19  Allergies: Review of patient's allergies indicates no known allergies.  Chief Complaint  Patient presents with  . Follow-up    HPI: Patient is 53 y.o. female who history of diabetes hypertension hyperlipidemia comes today for followup, patient is requesting refill on her medication currently taking lisinopril metoprolol, metformin, Glucotrol, Zocor, as per patient she has already eaten today, her hemoglobin A1c is 6.9%, slightly increased compared to last year, she denies any hypoglycemic symptoms, patient is up-to-date with her colonoscopy, would like to get a flu shot today.  Past Medical History  Diagnosis Date  . Anemia   . DM type 2 (diabetes mellitus, type 2)   . HLD (hyperlipidemia)     History reviewed. No pertinent past surgical history.    Medication List       This list is accurate as of: 05/19/14  3:28 PM.  Always use your most recent med list.               ARTHROTEC 75-0.2 MG Tbec  Generic drug:  Diclofenac-Misoprostol  Take by mouth.     aspirin 81 MG tablet  Take 81 mg by mouth daily.     ferrous sulfate 325 (65 FE) MG tablet  TAKE ONE TABLET BY MOUTH ONCE DAILY WITH  BREAKFAST     fluticasone 50 MCG/ACT nasal spray  Commonly known as:  FLONASE  Place 2 sprays into the nose daily.     glipiZIDE 5 MG tablet  Commonly known as:  GLUCOTROL  Take 1 tablet (5 mg total) by mouth 2 (two) times daily before a meal.     lisinopril 5 MG tablet  Commonly known as:  PRINIVIL,ZESTRIL  Take 0.5 tablets (2.5 mg total) by mouth daily.     metFORMIN 1000 MG tablet  Commonly known as:  GLUCOPHAGE  Take 1 tablet (1,000 mg total) by mouth 2 (two) times daily with a meal.     metoprolol tartrate 25 MG tablet  Commonly known as:  LOPRESSOR  TAKE 1 TABLET BY MOUTH 2 TIMES DAILY.     multivitamin with minerals tablet  Take 1 tablet by mouth daily.     nystatin cream  Commonly  known as:  MYCOSTATIN  APPLY TOPICALLY TWO TIMES A DAY     omeprazole 20 MG capsule  Commonly known as:  PRILOSEC  Take 1 capsule (20 mg total) by mouth daily.     simvastatin 10 MG tablet  Commonly known as:  ZOCOR  Take 1 tablet (10 mg total) by mouth at bedtime.     traZODone 50 MG tablet  Commonly known as:  DESYREL  Take 0.5-1 tablets (25-50 mg total) by mouth at bedtime as needed for sleep.     zolpidem 10 MG tablet  Commonly known as:  AMBIEN  Take 1 tablet (10 mg total) by mouth at bedtime as needed for sleep.        Meds ordered this encounter  Medications  . lisinopril (PRINIVIL,ZESTRIL) 5 MG tablet    Sig: Take 0.5 tablets (2.5 mg total) by mouth daily.    Dispense:  30 tablet    Refill:  5  . metoprolol tartrate (LOPRESSOR) 25 MG tablet    Sig: TAKE 1 TABLET BY MOUTH 2 TIMES DAILY.    Dispense:  60 tablet    Refill:  3    Future refills require an appointment.  . Multiple Vitamins-Minerals (MULTIVITAMIN  WITH MINERALS) tablet    Sig: Take 1 tablet by mouth daily.    Dispense:  30 tablet    Refill:  2  . DISCONTD: rosuvastatin (CRESTOR) 20 MG tablet    Sig: Take 20 mg by mouth daily.  Marland Kitchen glipiZIDE (GLUCOTROL) 5 MG tablet    Sig: Take 1 tablet (5 mg total) by mouth 2 (two) times daily before a meal.    Dispense:  60 tablet    Refill:  5  . metFORMIN (GLUCOPHAGE) 1000 MG tablet    Sig: Take 1 tablet (1,000 mg total) by mouth 2 (two) times daily with a meal.    Dispense:  60 tablet    Refill:  5  . simvastatin (ZOCOR) 10 MG tablet    Sig: Take 1 tablet (10 mg total) by mouth at bedtime.    Dispense:  90 tablet    Refill:  3    Immunization History  Administered Date(s) Administered  . Influenza Whole 03/25/2006, 04/10/2006, 06/04/2007, 04/11/2008  . Influenza,inj,Quad PF,36+ Mos 03/21/2013, 05/16/2013, 05/19/2014  . Pneumococcal Polysaccharide-23 07/18/2005, 07/21/2005  . Td 07/18/2005, 07/21/2005    Family History  Problem Relation Age of Onset  .  Diabetes Mother   . Diabetes Father     History  Substance Use Topics  . Smoking status: Never Smoker   . Smokeless tobacco: Not on file  . Alcohol Use: No    Review of Systems   As noted in HPI  Filed Vitals:   05/19/14 1502  BP: 126/85  Pulse: 90  Temp: 98 F (36.7 C)  Resp: 16    Physical Exam  Physical Exam  Constitutional: No distress.  Eyes: EOM are normal. Pupils are equal, round, and reactive to light.  Neck: Neck supple.  Cardiovascular: Normal rate and regular rhythm.   Pulmonary/Chest: Breath sounds normal. No respiratory distress.    CBC    Component Value Date/Time   WBC 6.9 05/16/2013 1601   RBC 5.54* 05/16/2013 1601   RBC 5.07 07/15/2012 0933   HGB 13.4 05/16/2013 1601   HCT 40.5 05/16/2013 1601   PLT 362 05/16/2013 1601   MCV 73.1* 05/16/2013 1601   LYMPHSABS 2.9 05/16/2013 1601   MONOABS 0.5 05/16/2013 1601   EOSABS 0.2 05/16/2013 1601   BASOSABS 0.0 05/16/2013 1601    CMP     Component Value Date/Time   NA 134* 05/16/2013 1601   K 4.6 05/16/2013 1601   CL 98 05/16/2013 1601   CO2 27 05/16/2013 1601   GLUCOSE 75 05/16/2013 1601   BUN 10 05/16/2013 1601   CREATININE 0.68 05/16/2013 1601   CREATININE 0.70 05/25/2012 1919   CALCIUM 9.8 05/16/2013 1601   PROT 7.8 05/16/2013 1601   ALBUMIN 4.5 05/16/2013 1601   AST 21 05/16/2013 1601   ALT 17 05/16/2013 1601   ALKPHOS 77 05/16/2013 1601   BILITOT 0.3 05/16/2013 1601   GFRNONAA >89 05/16/2013 1601   GFRAA >89 05/16/2013 1601    Lab Results  Component Value Date/Time   CHOL 215* 05/16/2013 04:01 PM    No components found for: HGA1C  Lab Results  Component Value Date/Time   AST 21 05/16/2013 04:01 PM    Assessment and Plan  Other specified diabetes mellitus without complications - Plan: Results for orders placed or performed in visit on 05/19/14  Glucose (CBG)  Result Value Ref Range   POC Glucose 84 70 - 99 mg/dl  HgB Z6X  Result Value Ref Range   Hemoglobin  A1C  6.9    I have advised patient for diabetes meal planning, continue blood glucose monitoring at home, refill on the medication glipiZIDE (GLUCOTROL) 5 MG tablet, metFORMIN (GLUCOPHAGE) 1000 MG tablet  Essential hypertension - Plan:advised patient for DASH diet continue with lisinopril (PRINIVIL,ZESTRIL) 5 MG tablet, metoprolol tartrate (LOPRESSOR) 25 MG tablet  Encounter for screening mammogram for breast cancer - Plan: MM DIGITAL SCREENING BILATERAL  Hyperlipemia - Plan:currently patient is on simvastatin (ZOCOR) 10 MG tablet, will repeat her fasting lipid panel on the next visit.  Needs flu shot   Health Maintenance -Colonoscopy: uptodate   -Mammogram: ordered  -Vaccinations:  Flu shot today   Return in about 3 months (around 08/18/2014) for diabetes, hypertension.  Doris CheadleADVANI, Joliene Salvador, MD

## 2014-05-19 NOTE — Patient Instructions (Signed)
Diabetes Mellitus and Food It is important for you to manage your blood sugar (glucose) level. Your blood glucose level can be greatly affected by what you eat. Eating healthier foods in the appropriate amounts throughout the day at about the same time each day will help you control your blood glucose level. It can also help slow or prevent worsening of your diabetes mellitus. Healthy eating may even help you improve the level of your blood pressure and reach or maintain a healthy weight.  HOW CAN FOOD AFFECT ME? Carbohydrates Carbohydrates affect your blood glucose level more than any other type of food. Your dietitian will help you determine how many carbohydrates to eat at each meal and teach you how to count carbohydrates. Counting carbohydrates is important to keep your blood glucose at a healthy level, especially if you are using insulin or taking certain medicines for diabetes mellitus. Alcohol Alcohol can cause sudden decreases in blood glucose (hypoglycemia), especially if you use insulin or take certain medicines for diabetes mellitus. Hypoglycemia can be a life-threatening condition. Symptoms of hypoglycemia (sleepiness, dizziness, and disorientation) are similar to symptoms of having too much alcohol.  If your health care provider has given you approval to drink alcohol, do so in moderation and use the following guidelines:  Women should not have more than one drink per day, and men should not have more than two drinks per day. One drink is equal to:  12 oz of beer.  5 oz of wine.  1 oz of hard liquor.  Do not drink on an empty stomach.  Keep yourself hydrated. Have water, diet soda, or unsweetened iced tea.  Regular soda, juice, and other mixers might contain a lot of carbohydrates and should be counted. WHAT FOODS ARE NOT RECOMMENDED? As you make food choices, it is important to remember that all foods are not the same. Some foods have fewer nutrients per serving than other  foods, even though they might have the same number of calories or carbohydrates. It is difficult to get your body what it needs when you eat foods with fewer nutrients. Examples of foods that you should avoid that are high in calories and carbohydrates but low in nutrients include:  Trans fats (most processed foods list trans fats on the Nutrition Facts label).  Regular soda.  Juice.  Candy.  Sweets, such as cake, pie, doughnuts, and cookies.  Fried foods. WHAT FOODS CAN I EAT? Have nutrient-rich foods, which will nourish your body and keep you healthy. The food you should eat also will depend on several factors, including:  The calories you need.  The medicines you take.  Your weight.  Your blood glucose level.  Your blood pressure level.  Your cholesterol level. You also should eat a variety of foods, including:  Protein, such as meat, poultry, fish, tofu, nuts, and seeds (lean animal proteins are best).  Fruits.  Vegetables.  Dairy products, such as milk, cheese, and yogurt (low fat is best).  Breads, grains, pasta, cereal, rice, and beans.  Fats such as olive oil, trans fat-free margarine, canola oil, avocado, and olives. DOES EVERYONE WITH DIABETES MELLITUS HAVE THE SAME MEAL PLAN? Because every person with diabetes mellitus is different, there is not one meal plan that works for everyone. It is very important that you meet with a dietitian who will help you create a meal plan that is just right for you. Document Released: 02/27/2005 Document Revised: 06/07/2013 Document Reviewed: 04/29/2013 [employer] Patient Information 2015 [employer]. This   information is not intended to replace advice given to you by your health care provider. Make sure you discuss any questions you have with your health care provider.  

## 2014-05-19 NOTE — Progress Notes (Signed)
Patient here for follow up on her diabetes and HTN Also in need of medication refills

## 2014-06-02 ENCOUNTER — Ambulatory Visit: Payer: Medicaid Other | Attending: Internal Medicine

## 2014-07-04 ENCOUNTER — Ambulatory Visit: Payer: Medicaid Other

## 2014-07-11 ENCOUNTER — Ambulatory Visit: Payer: Medicaid Other | Attending: Internal Medicine

## 2014-08-22 ENCOUNTER — Ambulatory Visit: Payer: Self-pay | Attending: Internal Medicine | Admitting: Internal Medicine

## 2014-08-22 ENCOUNTER — Encounter: Payer: Self-pay | Admitting: Internal Medicine

## 2014-08-22 VITALS — BP 144/89 | HR 76 | Temp 98.0°F | Resp 16 | Wt 159.6 lb

## 2014-08-22 DIAGNOSIS — E785 Hyperlipidemia, unspecified: Secondary | ICD-10-CM | POA: Insufficient documentation

## 2014-08-22 DIAGNOSIS — I10 Essential (primary) hypertension: Secondary | ICD-10-CM | POA: Insufficient documentation

## 2014-08-22 DIAGNOSIS — E119 Type 2 diabetes mellitus without complications: Secondary | ICD-10-CM | POA: Insufficient documentation

## 2014-08-22 DIAGNOSIS — Z79899 Other long term (current) drug therapy: Secondary | ICD-10-CM | POA: Insufficient documentation

## 2014-08-22 DIAGNOSIS — Z1231 Encounter for screening mammogram for malignant neoplasm of breast: Secondary | ICD-10-CM

## 2014-08-22 DIAGNOSIS — E139 Other specified diabetes mellitus without complications: Secondary | ICD-10-CM

## 2014-08-22 DIAGNOSIS — K029 Dental caries, unspecified: Secondary | ICD-10-CM | POA: Insufficient documentation

## 2014-08-22 DIAGNOSIS — Z7982 Long term (current) use of aspirin: Secondary | ICD-10-CM | POA: Insufficient documentation

## 2014-08-22 LAB — GLUCOSE, POCT (MANUAL RESULT ENTRY): POC Glucose: 153 mg/dl — AB (ref 70–99)

## 2014-08-22 LAB — COMPLETE METABOLIC PANEL WITH GFR
ALT: 12 U/L (ref 0–35)
AST: 19 U/L (ref 0–37)
Albumin: 4.1 g/dL (ref 3.5–5.2)
Alkaline Phosphatase: 60 U/L (ref 39–117)
BUN: 9 mg/dL (ref 6–23)
CHLORIDE: 102 meq/L (ref 96–112)
CO2: 27 meq/L (ref 19–32)
Calcium: 9.4 mg/dL (ref 8.4–10.5)
Creat: 0.7 mg/dL (ref 0.50–1.10)
GFR, Est African American: >89 mL/min
GFR, Est Non African American: >89 mL/min
Glucose, Bld: 125 mg/dL — ABNORMAL HIGH (ref 70–99)
Potassium: 4.6 mEq/L (ref 3.5–5.3)
Sodium: 138 mEq/L (ref 135–145)
TOTAL PROTEIN: 7.1 g/dL (ref 6.0–8.3)
Total Bilirubin: 0.3 mg/dL (ref 0.2–1.2)

## 2014-08-22 LAB — LIPID PANEL
CHOL/HDL RATIO: 3.7 ratio
CHOLESTEROL: 178 mg/dL (ref 0–200)
HDL: 48 mg/dL (ref >=46)
LDL Cholesterol: 109 mg/dL — ABNORMAL HIGH (ref 0–99)
TRIGLYCERIDES: 107 mg/dL (ref <150)
VLDL: 21 mg/dL (ref 0–40)

## 2014-08-22 LAB — POCT GLYCOSYLATED HEMOGLOBIN (HGB A1C): HEMOGLOBIN A1C: 6.6

## 2014-08-22 MED ORDER — GLIPIZIDE 5 MG PO TABS: 5.0000 mg | ORAL_TABLET | Freq: Two times a day (BID) | ORAL | Status: DC

## 2014-08-22 MED ORDER — METOPROLOL TARTRATE 25 MG PO TABS: ORAL_TABLET | ORAL | Status: DC

## 2014-08-22 MED ORDER — METFORMIN HCL 1000 MG PO TABS: 1000.0000 mg | ORAL_TABLET | Freq: Two times a day (BID) | ORAL | Status: DC

## 2014-08-22 MED ORDER — SIMVASTATIN 10 MG PO TABS: 10.0000 mg | ORAL_TABLET | Freq: Every day | ORAL | Status: DC

## 2014-08-22 MED ORDER — LISINOPRIL 5 MG PO TABS: 2.5000 mg | ORAL_TABLET | Freq: Every day | ORAL | Status: DC

## 2014-08-22 MED ORDER — OMEPRAZOLE 20 MG PO CPDR: 20.0000 mg | DELAYED_RELEASE_CAPSULE | Freq: Every day | ORAL | Status: DC

## 2014-08-22 NOTE — Progress Notes (Signed)
MRN: [id number] Name: Meredith Ryan  Sex: female Age: 54 y.o. DOB: 15-Jun-1961  Allergies: Review of patient's allergies indicates no known allergies.  Chief Complaint  Patient presents with  . Follow-up    HPI: Patient is 54 y.o. female who has history of diabetes hypertension hyperlipidemia, comes today for followup, she denies any hypoglycemic symptoms, has been compliant with her medications, her hemoglobin A1c is trending down compared to last visit, patient is requesting refill on her medications.patient is requesting referral to see a dentist, patient has also not seen ophthalmologist for the last 2-3 years.  Past Medical History  Diagnosis Date  . Anemia   . DM type 2 (diabetes mellitus, type 2)   . HLD (hyperlipidemia)     History reviewed. No pertinent past surgical history.    Medication List       This list is accurate as of: 08/22/14  1:01 PM.  Always use your most recent med list.               ARTHROTEC 75-0.2 MG Tbec  Generic drug:  Diclofenac-Misoprostol  Take by mouth.     aspirin 81 MG tablet  Take 81 mg by mouth daily.     ferrous sulfate 325 (65 FE) MG tablet  TAKE ONE TABLET BY MOUTH ONCE DAILY WITH  BREAKFAST     fluticasone 50 MCG/ACT nasal spray  Commonly known as:  FLONASE  Place 2 sprays into the nose daily.     glipiZIDE 5 MG tablet  Commonly known as:  GLUCOTROL  Take 1 tablet (5 mg total) by mouth 2 (two) times daily before a meal.     lisinopril 5 MG tablet  Commonly known as:  PRINIVIL,ZESTRIL  Take 0.5 tablets (2.5 mg total) by mouth daily.     metFORMIN 1000 MG tablet  Commonly known as:  GLUCOPHAGE  Take 1 tablet (1,000 mg total) by mouth 2 (two) times daily with a meal.     metoprolol tartrate 25 MG tablet  Commonly known as:  LOPRESSOR  TAKE 1 TABLET BY MOUTH 2 TIMES DAILY.     multivitamin with minerals tablet  Take 1 tablet by mouth daily.     nystatin cream  Commonly known as:  MYCOSTATIN  APPLY TOPICALLY  TWO TIMES A DAY     omeprazole 20 MG capsule  Commonly known as:  PRILOSEC  Take 1 capsule (20 mg total) by mouth daily.     simvastatin 10 MG tablet  Commonly known as:  ZOCOR  Take 1 tablet (10 mg total) by mouth at bedtime.     traZODone 50 MG tablet  Commonly known as:  DESYREL  Take 0.5-1 tablets (25-50 mg total) by mouth at bedtime as needed for sleep.     zolpidem 10 MG tablet  Commonly known as:  AMBIEN  Take 1 tablet (10 mg total) by mouth at bedtime as needed for sleep.        Meds ordered this encounter  Medications  . glipiZIDE (GLUCOTROL) 5 MG tablet    Sig: Take 1 tablet (5 mg total) by mouth 2 (two) times daily before a meal.    Dispense:  60 tablet    Refill:  5  . lisinopril (PRINIVIL,ZESTRIL) 5 MG tablet    Sig: Take 0.5 tablets (2.5 mg total) by mouth daily.    Dispense:  30 tablet    Refill:  5  . metFORMIN (GLUCOPHAGE) 1000 MG tablet    Sig: Take  1 tablet (1,000 mg total) by mouth 2 (two) times daily with a meal.    Dispense:  60 tablet    Refill:  5  . metoprolol tartrate (LOPRESSOR) 25 MG tablet    Sig: TAKE 1 TABLET BY MOUTH 2 TIMES DAILY.    Dispense:  60 tablet    Refill:  3    Future refills require an appointment.  Marland Kitchen. omeprazole (PRILOSEC) 20 MG capsule    Sig: Take 1 capsule (20 mg total) by mouth daily.    Dispense:  30 capsule    Refill:  3  . simvastatin (ZOCOR) 10 MG tablet    Sig: Take 1 tablet (10 mg total) by mouth at bedtime.    Dispense:  90 tablet    Refill:  3    Immunization History  Administered Date(s) Administered  . Influenza Whole 03/25/2006, 04/10/2006, 06/04/2007, 04/11/2008  . Influenza,inj,Quad PF,36+ Mos 03/21/2013, 05/16/2013, 05/19/2014  . Pneumococcal Polysaccharide-23 07/18/2005, 07/21/2005  . Td 07/18/2005, 07/21/2005    Family History  Problem Relation Age of Onset  . Diabetes Mother   . Diabetes Father     History  Substance Use Topics  . Smoking status: Never Smoker   . Smokeless tobacco: Not  on file  . Alcohol Use: No    Review of Systems  HENT: Positive for dental problem.   Respiratory: Negative for cough and choking.   Cardiovascular: Negative for chest pain.     As noted in HPI  Filed Vitals:   08/22/14 1120  BP: 144/89  Pulse: 76  Temp: 98 F (36.7 C)  Resp: 16    Physical Exam  Physical Exam  Constitutional: No distress.  Eyes: EOM are normal. Pupils are equal, round, and reactive to light.  Cardiovascular: Normal rate and regular rhythm.   Pulmonary/Chest: Breath sounds normal. No respiratory distress. She has no wheezes. She has no rales.  Abdominal: Soft. There is no tenderness. There is no rebound.  Musculoskeletal: She exhibits no edema.    CBC    Component Value Date/Time   WBC 6.9 05/16/2013 1601   RBC 5.54* 05/16/2013 1601   RBC 5.07 07/15/2012 0933   HGB 13.4 05/16/2013 1601   HCT 40.5 05/16/2013 1601   PLT 362 05/16/2013 1601   MCV 73.1* 05/16/2013 1601   LYMPHSABS 2.9 05/16/2013 1601   MONOABS 0.5 05/16/2013 1601   EOSABS 0.2 05/16/2013 1601   BASOSABS 0.0 05/16/2013 1601    CMP     Component Value Date/Time   NA 134* 05/16/2013 1601   K 4.6 05/16/2013 1601   CL 98 05/16/2013 1601   CO2 27 05/16/2013 1601   GLUCOSE 75 05/16/2013 1601   BUN 10 05/16/2013 1601   CREATININE 0.68 05/16/2013 1601   CREATININE 0.70 05/25/2012 1919   CALCIUM 9.8 05/16/2013 1601   PROT 7.8 05/16/2013 1601   ALBUMIN 4.5 05/16/2013 1601   AST 21 05/16/2013 1601   ALT 17 05/16/2013 1601   ALKPHOS 77 05/16/2013 1601   BILITOT 0.3 05/16/2013 1601   GFRNONAA >89 05/16/2013 1601   GFRAA >89 05/16/2013 1601    Lab Results  Component Value Date/Time   CHOL 215* 05/16/2013 04:01 PM    No components found for: HGA1C  Lab Results  Component Value Date/Time   AST 21 05/16/2013 04:01 PM    Assessment and Plan  Other specified diabetes mellitus without complications - Plan:  Results for orders placed or performed in visit on 08/22/14    Glucose (  CBG)  Result Value Ref Range   POC Glucose 153.0 (A) 70 - 99 mg/dl  HgB Z6X  Result Value Ref Range   Hemoglobin A1C 6.60    HgB A1c has trended down continued current medications, glipiZIDE (GLUCOTROL) 5 MG tablet, metFORMIN (GLUCOPHAGE) 1000 MG tablet, COMPLETE METABOLIC PANEL WITH GFR, Ambulatory referral to Ophthalmology  Essential hypertension - Plan:blood pressure is borderline elevated, as per patient she has not taken the blood pressure medication today, advised patient for DASH diet and continued current meds. lisinopril (PRINIVIL,ZESTRIL) 5 MG tablet, metoprolol tartrate (LOPRESSOR) 25 MG tablet, COMPLETE METABOLIC PANEL WITH GFR  Hyperlipemia - Plan: currently patient is onsimvastatin (ZOCOR) 10 MG tablet,will repeat Lipid panel  Encounter for screening mammogram for breast cancer - Plan: MM DIGITAL SCREENING BILATERAL  Dental cavities - Plan: Ambulatory referral to Dentistry   Health Maintenance -Colonoscopy:up-to-date : -Mammogram:ordered -Vaccinations: Up-to-date with flu shot -  Return in about 3 months (around 11/22/2014) for diabetes, hypertension, hyperipidemia.   This note has been created with Education officer, environmental. Any transcriptional errors are unintentional.    Doris Cheadle, MD

## 2014-08-22 NOTE — Progress Notes (Signed)
Patient here for follow up on her diabetes and cholesterol Patient also requesting refills on her medications

## 2014-08-22 NOTE — Patient Instructions (Signed)
Diabetes Mellitus and Food It is important for you to manage your blood sugar (glucose) level. Your blood glucose level can be greatly affected by what you eat. Eating healthier foods in the appropriate amounts throughout the day at about the same time each day will help you control your blood glucose level. It can also help slow or prevent worsening of your diabetes mellitus. Healthy eating may even help you improve the level of your blood pressure and reach or maintain a healthy weight.  HOW CAN FOOD AFFECT ME? Carbohydrates Carbohydrates affect your blood glucose level more than any other type of food. Your dietitian will help you determine how many carbohydrates to eat at each meal and teach you how to count carbohydrates. Counting carbohydrates is important to keep your blood glucose at a healthy level, especially if you are using insulin or taking certain medicines for diabetes mellitus. Alcohol Alcohol can cause sudden decreases in blood glucose (hypoglycemia), especially if you use insulin or take certain medicines for diabetes mellitus. Hypoglycemia can be a life-threatening condition. Symptoms of hypoglycemia (sleepiness, dizziness, and disorientation) are similar to symptoms of having too much alcohol.  If your health care provider has given you approval to drink alcohol, do so in moderation and use the following guidelines:  Women should not have more than one drink per day, and men should not have more than two drinks per day. One drink is equal to:  12 oz of beer.  5 oz of wine.  1 oz of hard liquor.  Do not drink on an empty stomach.  Keep yourself hydrated. Have water, diet soda, or unsweetened iced tea.  Regular soda, juice, and other mixers might contain a lot of carbohydrates and should be counted. WHAT FOODS ARE NOT RECOMMENDED? As you make food choices, it is important to remember that all foods are not the same. Some foods have fewer nutrients per serving than other  foods, even though they might have the same number of calories or carbohydrates. It is difficult to get your body what it needs when you eat foods with fewer nutrients. Examples of foods that you should avoid that are high in calories and carbohydrates but low in nutrients include:  Trans fats (most processed foods list trans fats on the Nutrition Facts label).  Regular soda.  Juice.  Candy.  Sweets, such as cake, pie, doughnuts, and cookies.  Fried foods. WHAT FOODS CAN I EAT? Have nutrient-rich foods, which will nourish your body and keep you healthy. The food you should eat also will depend on several factors, including:  The calories you need.  The medicines you take.  Your weight.  Your blood glucose level.  Your blood pressure level.  Your cholesterol level. You also should eat a variety of foods, including:  Protein, such as meat, poultry, fish, tofu, nuts, and seeds (lean animal proteins are best).  Fruits.  Vegetables.  Dairy products, such as milk, cheese, and yogurt (low fat is best).  Breads, grains, pasta, cereal, rice, and beans.  Fats such as olive oil, trans fat-free margarine, canola oil, avocado, and olives. DOES EVERYONE WITH DIABETES MELLITUS HAVE THE SAME MEAL PLAN? Because every person with diabetes mellitus is different, there is not one meal plan that works for everyone. It is very important that you meet with a dietitian who will help you create a meal plan that is just right for you. Document Released: 02/27/2005 Document Revised: 06/07/2013 Document Reviewed: 04/29/2013 ExitCare Patient Information 2015 [employer]. This   information is not intended to replace advice given to you by your health care provider. Make sure you discuss any questions you have with your health care provider. DASH Eating Plan DASH stands for "Dietary Approaches to Stop Hypertension." The DASH eating plan is a healthy eating plan that has been shown to reduce high  blood pressure (hypertension). Additional health benefits may include reducing the risk of type 2 diabetes mellitus, heart disease, and stroke. The DASH eating plan may also help with weight loss. WHAT DO I NEED TO KNOW ABOUT THE DASH EATING PLAN? For the DASH eating plan, you will follow these general guidelines:  Choose foods with a percent daily value for sodium of less than 5% (as listed on the food label).  Use salt-free seasonings or herbs instead of table salt or sea salt.  Check with your health care provider or pharmacist before using salt substitutes.  Eat lower-sodium products, often labeled as "lower sodium" or "no salt added."  Eat fresh foods.  Eat more vegetables, fruits, and low-fat dairy products.  Choose whole grains. Look for the word "whole" as the first word in the ingredient list.  Choose fish and skinless chicken or turkey more often than red meat. Limit fish, poultry, and meat to 6 oz (170 g) each day.  Limit sweets, desserts, sugars, and sugary drinks.  Choose heart-healthy fats.  Limit cheese to 1 oz (28 g) per day.  Eat more home-cooked food and less restaurant, buffet, and fast food.  Limit fried foods.  Cook foods using methods other than frying.  Limit canned vegetables. If you do use them, rinse them well to decrease the sodium.  When eating at a restaurant, ask that your food be prepared with less salt, or no salt if possible. WHAT FOODS CAN I EAT? Seek help from a dietitian for individual calorie needs. Grains Whole grain or whole wheat bread. Brown rice. Whole grain or whole wheat pasta. Quinoa, bulgur, and whole grain cereals. Low-sodium cereals. Corn or whole wheat flour tortillas. Whole grain cornbread. Whole grain crackers. Low-sodium crackers. Vegetables Fresh or frozen vegetables (raw, steamed, roasted, or grilled). Low-sodium or reduced-sodium tomato and vegetable juices. Low-sodium or reduced-sodium tomato sauce and paste. Low-sodium  or reduced-sodium canned vegetables.  Fruits All fresh, canned (in natural juice), or frozen fruits. Meat and Other Protein Products Ground beef (85% or leaner), grass-fed beef, or beef trimmed of fat. Skinless chicken or turkey. Ground chicken or turkey. Pork trimmed of fat. All fish and seafood. Eggs. Dried beans, peas, or lentils. Unsalted nuts and seeds. Unsalted canned beans. Dairy Low-fat dairy products, such as skim or 1% milk, 2% or reduced-fat cheeses, low-fat ricotta or cottage cheese, or plain low-fat yogurt. Low-sodium or reduced-sodium cheeses. Fats and Oils Tub margarines without trans fats. Light or reduced-fat mayonnaise and salad dressings (reduced sodium). Avocado. Safflower, olive, or canola oils. Natural peanut or almond butter. Other Unsalted popcorn and pretzels. The items listed above may not be a complete list of recommended foods or beverages. Contact your dietitian for more options. WHAT FOODS ARE NOT RECOMMENDED? Grains White bread. White pasta. White rice. Refined cornbread. Bagels and croissants. Crackers that contain trans fat. Vegetables Creamed or fried vegetables. Vegetables in a cheese sauce. Regular canned vegetables. Regular canned tomato sauce and paste. Regular tomato and vegetable juices. Fruits Dried fruits. Canned fruit in light or heavy syrup. Fruit juice. Meat and Other Protein Products Fatty cuts of meat. Ribs, chicken wings, bacon, sausage, bologna, salami, chitterlings, fatback, hot   dogs, bratwurst, and packaged luncheon meats. Salted nuts and seeds. Canned beans with salt. Dairy Whole or 2% milk, cream, half-and-half, and cream cheese. Whole-fat or sweetened yogurt. Full-fat cheeses or blue cheese. Nondairy creamers and whipped toppings. Processed cheese, cheese spreads, or cheese curds. Condiments Onion and garlic salt, seasoned salt, table salt, and sea salt. Canned and packaged gravies. Worcestershire sauce. Tartar sauce. Barbecue sauce.  Teriyaki sauce. Soy sauce, including reduced sodium. Steak sauce. Fish sauce. Oyster sauce. Cocktail sauce. Horseradish. Ketchup and mustard. Meat flavorings and tenderizers. Bouillon cubes. Hot sauce. Tabasco sauce. Marinades. Taco seasonings. Relishes. Fats and Oils Butter, stick margarine, lard, shortening, ghee, and bacon fat. Coconut, palm kernel, or palm oils. Regular salad dressings. Other Pickles and olives. Salted popcorn and pretzels. The items listed above may not be a complete list of foods and beverages to avoid. Contact your dietitian for more information. WHERE CAN I FIND MORE INFORMATION? National Heart, Lung, and [employer] Institute: www.nhlbi.nih.gov/health/health-topics/topics/dash/ Document Released: 05/22/2011 Document Revised: 10/17/2013 Document Reviewed: 04/06/2013 ExitCare Patient Information 2015 [employer]. This information is not intended to replace advice given to you by your health care provider. Make sure you discuss any questions you have with your health care provider.  

## 2014-08-30 ENCOUNTER — Telehealth: Payer: Self-pay

## 2014-08-30 NOTE — Telephone Encounter (Signed)
Patient not available Not able to leave voice mail

## 2014-08-30 NOTE — Telephone Encounter (Signed)
-----   Message from Doris Cheadleeepak Advani, MD sent at 08/23/2014  9:11 AM EST ----- Call and let the patient know that her cholesterol is improved, continue with Zocor and low-fat diet.

## 2015-01-08 ENCOUNTER — Other Ambulatory Visit: Payer: Self-pay

## 2015-01-08 MED ORDER — OMEPRAZOLE 20 MG PO CPDR: 20.0000 mg | DELAYED_RELEASE_CAPSULE | Freq: Every day | ORAL | Status: DC

## 2015-01-09 ENCOUNTER — Encounter: Payer: Self-pay | Admitting: Internal Medicine

## 2015-01-09 ENCOUNTER — Ambulatory Visit: Payer: Self-pay | Attending: Internal Medicine | Admitting: Internal Medicine

## 2015-01-09 VITALS — BP 137/85 | HR 97 | Temp 98.5°F | Resp 16

## 2015-01-09 DIAGNOSIS — Z79899 Other long term (current) drug therapy: Secondary | ICD-10-CM | POA: Insufficient documentation

## 2015-01-09 DIAGNOSIS — E119 Type 2 diabetes mellitus without complications: Secondary | ICD-10-CM | POA: Insufficient documentation

## 2015-01-09 DIAGNOSIS — E785 Hyperlipidemia, unspecified: Secondary | ICD-10-CM | POA: Insufficient documentation

## 2015-01-09 DIAGNOSIS — I10 Essential (primary) hypertension: Secondary | ICD-10-CM | POA: Insufficient documentation

## 2015-01-09 DIAGNOSIS — E139 Other specified diabetes mellitus without complications: Secondary | ICD-10-CM

## 2015-01-09 DIAGNOSIS — K219 Gastro-esophageal reflux disease without esophagitis: Secondary | ICD-10-CM | POA: Insufficient documentation

## 2015-01-09 DIAGNOSIS — Z7982 Long term (current) use of aspirin: Secondary | ICD-10-CM | POA: Insufficient documentation

## 2015-01-09 LAB — POCT GLYCOSYLATED HEMOGLOBIN (HGB A1C): Hemoglobin A1C: 6.4

## 2015-01-09 LAB — GLUCOSE, POCT (MANUAL RESULT ENTRY): POC Glucose: 152 mg/dl — AB (ref 70–99)

## 2015-01-09 MED ORDER — GLIPIZIDE 5 MG PO TABS: 5.0000 mg | ORAL_TABLET | Freq: Two times a day (BID) | ORAL | Status: DC

## 2015-01-09 MED ORDER — LISINOPRIL 5 MG PO TABS: 2.5000 mg | ORAL_TABLET | Freq: Every day | ORAL | Status: DC

## 2015-01-09 MED ORDER — SIMVASTATIN 10 MG PO TABS: 10.0000 mg | ORAL_TABLET | Freq: Every day | ORAL | Status: DC

## 2015-01-09 MED ORDER — METFORMIN HCL 1000 MG PO TABS: 1000.0000 mg | ORAL_TABLET | Freq: Two times a day (BID) | ORAL | Status: DC

## 2015-01-09 MED ORDER — OMEPRAZOLE 20 MG PO CPDR: 20.0000 mg | DELAYED_RELEASE_CAPSULE | Freq: Every day | ORAL | Status: DC

## 2015-01-09 NOTE — Progress Notes (Signed)
MRN: [id number] Name: Meredith Ryan  Sex: female Age: 54 y.o. DOB: 1961/01/09  Allergies: Review of patient's allergies indicates no known allergies.  Chief Complaint  Patient presents with  . Follow-up    HPI: Patient is 54 y.o. female who history of hypokalemia hypertension diabetes, GERD comes today for followup, as per patient she is compliant in taking her medications, she is requesting refill on her meds, currently denies any headache dizziness chest and shortness of breath, on the last visit she was referred to her ophthalmology as per patient she has not been able to schedule appointment yet.  Past Medical History  Diagnosis Date  . Anemia   . DM type 2 (diabetes mellitus, type 2)   . HLD (hyperlipidemia)     History reviewed. No pertinent past surgical history.    Medication List       This list is accurate as of: 01/09/15 11:10 AM.  Always use your most recent med list.               ARTHROTEC 75-0.2 MG Tbec  Generic drug:  Diclofenac-Misoprostol  Take by mouth.     aspirin 81 MG tablet  Take 81 mg by mouth daily.     ferrous sulfate 325 (65 FE) MG tablet  TAKE ONE TABLET BY MOUTH ONCE DAILY WITH  BREAKFAST     fluticasone 50 MCG/ACT nasal spray  Commonly known as:  FLONASE  Place 2 sprays into the nose daily.     glipiZIDE 5 MG tablet  Commonly known as:  GLUCOTROL  Take 1 tablet (5 mg total) by mouth 2 (two) times daily before a meal.     lisinopril 5 MG tablet  Commonly known as:  PRINIVIL,ZESTRIL  Take 0.5 tablets (2.5 mg total) by mouth daily.     metFORMIN 1000 MG tablet  Commonly known as:  GLUCOPHAGE  Take 1 tablet (1,000 mg total) by mouth 2 (two) times daily with a meal.     metoprolol tartrate 25 MG tablet  Commonly known as:  LOPRESSOR  TAKE 1 TABLET BY MOUTH 2 TIMES DAILY.     multivitamin with minerals tablet  Take 1 tablet by mouth daily.     nystatin cream  Commonly known as:  MYCOSTATIN  APPLY TOPICALLY TWO TIMES A  DAY     omeprazole 20 MG capsule  Commonly known as:  PRILOSEC  Take 1 capsule (20 mg total) by mouth daily.     simvastatin 10 MG tablet  Commonly known as:  ZOCOR  Take 1 tablet (10 mg total) by mouth at bedtime.     traZODone 50 MG tablet  Commonly known as:  DESYREL  Take 0.5-1 tablets (25-50 mg total) by mouth at bedtime as needed for sleep.     zolpidem 10 MG tablet  Commonly known as:  AMBIEN  Take 1 tablet (10 mg total) by mouth at bedtime as needed for sleep.        Meds ordered this encounter  Medications  . glipiZIDE (GLUCOTROL) 5 MG tablet    Sig: Take 1 tablet (5 mg total) by mouth 2 (two) times daily before a meal.    Dispense:  60 tablet    Refill:  5  . lisinopril (PRINIVIL,ZESTRIL) 5 MG tablet    Sig: Take 0.5 tablets (2.5 mg total) by mouth daily.    Dispense:  30 tablet    Refill:  5  . metFORMIN (GLUCOPHAGE) 1000 MG tablet  Sig: Take 1 tablet (1,000 mg total) by mouth 2 (two) times daily with a meal.    Dispense:  60 tablet    Refill:  5  . omeprazole (PRILOSEC) 20 MG capsule    Sig: Take 1 capsule (20 mg total) by mouth daily.    Dispense:  30 capsule    Refill:  3  . simvastatin (ZOCOR) 10 MG tablet    Sig: Take 1 tablet (10 mg total) by mouth at bedtime.    Dispense:  90 tablet    Refill:  3    Immunization History  Administered Date(s) Administered  . Influenza Whole 03/25/2006, 04/10/2006, 06/04/2007, 04/11/2008  . Influenza,inj,Quad PF,36+ Mos 03/21/2013, 05/16/2013, 05/19/2014  . Pneumococcal Polysaccharide-23 07/18/2005, 07/21/2005  . Td 07/18/2005, 07/21/2005    Family History  Problem Relation Age of Onset  . Diabetes Mother   . Diabetes Father     History  Substance Use Topics  . Smoking status: Never Smoker   . Smokeless tobacco: Not on file  . Alcohol Use: No    Review of Systems   As noted in HPI  Filed Vitals:   01/09/15 1057  BP: 137/85  Pulse: 97  Temp: 98.5 F (36.9 C)  Resp: 16    Physical  Exam  Physical Exam  Constitutional: No distress.  Eyes: EOM are normal. Pupils are equal, round, and reactive to light.  Cardiovascular: Normal rate and regular rhythm.   Pulmonary/Chest: Breath sounds normal. No respiratory distress. She has no wheezes. She has no rales.  Musculoskeletal: She exhibits no edema.    CBC    Component Value Date/Time   WBC 6.9 05/16/2013 1601   RBC 5.54* 05/16/2013 1601   RBC 5.07 07/15/2012 0933   HGB 13.4 05/16/2013 1601   HCT 40.5 05/16/2013 1601   PLT 362 05/16/2013 1601   MCV 73.1* 05/16/2013 1601   LYMPHSABS 2.9 05/16/2013 1601   MONOABS 0.5 05/16/2013 1601   EOSABS 0.2 05/16/2013 1601   BASOSABS 0.0 05/16/2013 1601    CMP     Component Value Date/Time   NA 138 08/22/2014 1152   K 4.6 08/22/2014 1152   CL 102 08/22/2014 1152   CO2 27 08/22/2014 1152   GLUCOSE 125* 08/22/2014 1152   BUN 9 08/22/2014 1152   CREATININE 0.70 08/22/2014 1152   CREATININE 0.70 05/25/2012 1919   CALCIUM 9.4 08/22/2014 1152   PROT 7.1 08/22/2014 1152   ALBUMIN 4.1 08/22/2014 1152   AST 19 08/22/2014 1152   ALT 12 08/22/2014 1152   ALKPHOS 60 08/22/2014 1152   BILITOT 0.3 08/22/2014 1152   GFRNONAA >89 08/22/2014 1152   GFRAA >89 08/22/2014 1152    Lab Results  Component Value Date/Time   CHOL 178 08/22/2014 11:52 AM    Lab Results  Component Value Date/Time   HGBA1C 6.40 01/09/2015 10:57 AM   HGBA1C 7.1* 08/27/2010 12:09 AM    Lab Results  Component Value Date/Time   AST 19 08/22/2014 11:52 AM    Assessment and Plan  Other specified diabetes mellitus without complications - Plan:  Results for orders placed or performed in visit on 01/09/15  Glucose (CBG)  Result Value Ref Range   POC Glucose 152.0 (A) 70 - 99 mg/dl  HgB Z6X  Result Value Ref Range   Hemoglobin A1C 6.40    Hemoglobin A1c has trended down, continue with low carbohydrate diet and current meds , glipiZIDE (GLUCOTROL) 5 MG tablet, metFORMIN (GLUCOPHAGE) 1000 MG  tablet, Ambulatory  referral to Ophthalmology  Essential hypertension - Plan:advised patient for DASH diet, continue with  lisinopril (PRINIVIL,ZESTRIL) 5 MG tablet  Hyperlipemia - Plan: currently patient is on simvastatin (ZOCOR) 10 MG tablet, fasting lipid panel on the following visit.  Gastroesophageal reflux disease, esophagitis presence not specified - Plan: omeprazole (PRILOSEC) 20 MG capsule   Health Maintenance -Colonoscopy:up-to-date  -Mammogram: already been ordered, patient to schedule appointment.  Return in about 3 months (around 04/11/2015), or if symptoms worsen or fail to improve.   This note has been created with Education officer, environmental. Any transcriptional errors are unintentional.    Doris Cheadle, MD

## 2015-01-09 NOTE — Patient Instructions (Signed)
Diabetes Mellitus and Food It is important for you to manage your blood sugar (glucose) level. Your blood glucose level can be greatly affected by what you eat. Eating healthier foods in the appropriate amounts throughout the day at about the same time each day will help you control your blood glucose level. It can also help slow or prevent worsening of your diabetes mellitus. Healthy eating may even help you improve the level of your blood pressure and reach or maintain a healthy weight.  HOW CAN FOOD AFFECT ME? Carbohydrates Carbohydrates affect your blood glucose level more than any other type of food. Your dietitian will help you determine how many carbohydrates to eat at each meal and teach you how to count carbohydrates. Counting carbohydrates is important to keep your blood glucose at a healthy level, especially if you are using insulin or taking certain medicines for diabetes mellitus. Alcohol Alcohol can cause sudden decreases in blood glucose (hypoglycemia), especially if you use insulin or take certain medicines for diabetes mellitus. Hypoglycemia can be a life-threatening condition. Symptoms of hypoglycemia (sleepiness, dizziness, and disorientation) are similar to symptoms of having too much alcohol.  If your health care provider has given you approval to drink alcohol, do so in moderation and use the following guidelines:  Women should not have more than one drink per day, and men should not have more than two drinks per day. One drink is equal to:  12 oz of beer.  5 oz of wine.  1 oz of hard liquor.  Do not drink on an empty stomach.  Keep yourself hydrated. Have water, diet soda, or unsweetened iced tea.  Regular soda, juice, and other mixers might contain a lot of carbohydrates and should be counted. WHAT FOODS ARE NOT RECOMMENDED? As you make food choices, it is important to remember that all foods are not the same. Some foods have fewer nutrients per serving than other  foods, even though they might have the same number of calories or carbohydrates. It is difficult to get your body what it needs when you eat foods with fewer nutrients. Examples of foods that you should avoid that are high in calories and carbohydrates but low in nutrients include:  Trans fats (most processed foods list trans fats on the Nutrition Facts label).  Regular soda.  Juice.  Candy.  Sweets, such as cake, pie, doughnuts, and cookies.  Fried foods. WHAT FOODS CAN I EAT? Have nutrient-rich foods, which will nourish your body and keep you healthy. The food you should eat also will depend on several factors, including:  The calories you need.  The medicines you take.  Your weight.  Your blood glucose level.  Your blood pressure level.  Your cholesterol level. You also should eat a variety of foods, including:  Protein, such as meat, poultry, fish, tofu, nuts, and seeds (lean animal proteins are best).  Fruits.  Vegetables.  Dairy products, such as milk, cheese, and yogurt (low fat is best).  Breads, grains, pasta, cereal, rice, and beans.  Fats such as olive oil, trans fat-free margarine, canola oil, avocado, and olives. DOES EVERYONE WITH DIABETES MELLITUS HAVE THE SAME MEAL PLAN? Because every person with diabetes mellitus is different, there is not one meal plan that works for everyone. It is very important that you meet with a dietitian who will help you create a meal plan that is just right for you. Document Released: 02/27/2005 Document Revised: 06/07/2013 Document Reviewed: 04/29/2013 ExitCare Patient Information 2015 [employer]. This   information is not intended to replace advice given to you by your health care provider. Make sure you discuss any questions you have with your health care provider. DASH Eating Plan DASH stands for "Dietary Approaches to Stop Hypertension." The DASH eating plan is a healthy eating plan that has been shown to reduce high  blood pressure (hypertension). Additional health benefits may include reducing the risk of type 2 diabetes mellitus, heart disease, and stroke. The DASH eating plan may also help with weight loss. WHAT DO I NEED TO KNOW ABOUT THE DASH EATING PLAN? For the DASH eating plan, you will follow these general guidelines:  Choose foods with a percent daily value for sodium of less than 5% (as listed on the food label).  Use salt-free seasonings or herbs instead of table salt or sea salt.  Check with your health care provider or pharmacist before using salt substitutes.  Eat lower-sodium products, often labeled as "lower sodium" or "no salt added."  Eat fresh foods.  Eat more vegetables, fruits, and low-fat dairy products.  Choose whole grains. Look for the word "whole" as the first word in the ingredient list.  Choose fish and skinless chicken or turkey more often than red meat. Limit fish, poultry, and meat to 6 oz (170 g) each day.  Limit sweets, desserts, sugars, and sugary drinks.  Choose heart-healthy fats.  Limit cheese to 1 oz (28 g) per day.  Eat more home-cooked food and less restaurant, buffet, and fast food.  Limit fried foods.  Cook foods using methods other than frying.  Limit canned vegetables. If you do use them, rinse them well to decrease the sodium.  When eating at a restaurant, ask that your food be prepared with less salt, or no salt if possible. WHAT FOODS CAN I EAT? Seek help from a dietitian for individual calorie needs. Grains Whole grain or whole wheat bread. Brown rice. Whole grain or whole wheat pasta. Quinoa, bulgur, and whole grain cereals. Low-sodium cereals. Corn or whole wheat flour tortillas. Whole grain cornbread. Whole grain crackers. Low-sodium crackers. Vegetables Fresh or frozen vegetables (raw, steamed, roasted, or grilled). Low-sodium or reduced-sodium tomato and vegetable juices. Low-sodium or reduced-sodium tomato sauce and paste. Low-sodium  or reduced-sodium canned vegetables.  Fruits All fresh, canned (in natural juice), or frozen fruits. Meat and Other Protein Products Ground beef (85% or leaner), grass-fed beef, or beef trimmed of fat. Skinless chicken or turkey. Ground chicken or turkey. Pork trimmed of fat. All fish and seafood. Eggs. Dried beans, peas, or lentils. Unsalted nuts and seeds. Unsalted canned beans. Dairy Low-fat dairy products, such as skim or 1% milk, 2% or reduced-fat cheeses, low-fat ricotta or cottage cheese, or plain low-fat yogurt. Low-sodium or reduced-sodium cheeses. Fats and Oils Tub margarines without trans fats. Light or reduced-fat mayonnaise and salad dressings (reduced sodium). Avocado. Safflower, olive, or canola oils. Natural peanut or almond butter. Other Unsalted popcorn and pretzels. The items listed above may not be a complete list of recommended foods or beverages. Contact your dietitian for more options. WHAT FOODS ARE NOT RECOMMENDED? Grains White bread. White pasta. White rice. Refined cornbread. Bagels and croissants. Crackers that contain trans fat. Vegetables Creamed or fried vegetables. Vegetables in a cheese sauce. Regular canned vegetables. Regular canned tomato sauce and paste. Regular tomato and vegetable juices. Fruits Dried fruits. Canned fruit in light or heavy syrup. Fruit juice. Meat and Other Protein Products Fatty cuts of meat. Ribs, chicken wings, bacon, sausage, bologna, salami, chitterlings, fatback, hot   dogs, bratwurst, and packaged luncheon meats. Salted nuts and seeds. Canned beans with salt. Dairy Whole or 2% milk, cream, half-and-half, and cream cheese. Whole-fat or sweetened yogurt. Full-fat cheeses or blue cheese. Nondairy creamers and whipped toppings. Processed cheese, cheese spreads, or cheese curds. Condiments Onion and garlic salt, seasoned salt, table salt, and sea salt. Canned and packaged gravies. Worcestershire sauce. Tartar sauce. Barbecue sauce.  Teriyaki sauce. Soy sauce, including reduced sodium. Steak sauce. Fish sauce. Oyster sauce. Cocktail sauce. Horseradish. Ketchup and mustard. Meat flavorings and tenderizers. Bouillon cubes. Hot sauce. Tabasco sauce. Marinades. Taco seasonings. Relishes. Fats and Oils Butter, stick margarine, lard, shortening, ghee, and bacon fat. Coconut, palm kernel, or palm oils. Regular salad dressings. Other Pickles and olives. Salted popcorn and pretzels. The items listed above may not be a complete list of foods and beverages to avoid. Contact your dietitian for more information. WHERE CAN I FIND MORE INFORMATION? National Heart, Lung, and [employer] Institute: www.nhlbi.nih.gov/health/health-topics/topics/dash/ Document Released: 05/22/2011 Document Revised: 10/17/2013 Document Reviewed: 04/06/2013 ExitCare Patient Information 2015 [employer]. This information is not intended to replace advice given to you by your health care provider. Make sure you discuss any questions you have with your health care provider.  

## 2015-01-09 NOTE — Progress Notes (Signed)
Patient here for follow up on her diabetes and cholesterol and  Also requesting refills on her medications

## 2015-07-23 MED FILL — OMEPRAZOLE DR 20 MG CAPSULE: 20 | 30 days supply | Qty: 30 | Fill #2

## 2015-07-23 MED FILL — SIMVASTATIN 10 MG TABLET: 10 | 30 days supply | Qty: 30 | Fill #2

## 2015-07-23 MED FILL — LISINOPRIL 5 MG TABLET: 5 | 30 days supply | Qty: 15 | Fill #2

## 2015-07-23 MED FILL — metFORMIN HCL 1000 MG TABS: 1000 | 30 days supply | Qty: 60 | Fill #2

## 2015-07-23 MED FILL — ?GLIPIZIDE 5MG TABLET: 5 | 30 days supply | Qty: 60 | Fill #1

## 2015-08-27 MED FILL — LISINOPRIL 5 MG TABLET: 5 | 30 days supply | Qty: 15 | Fill #3

## 2015-09-03 MED FILL — SIMVASTATIN 10 MG TABLET: 10 | 30 days supply | Qty: 30 | Fill #3

## 2015-09-03 MED FILL — ?GLIPIZIDE 5MG TABLET: 5 | 30 days supply | Qty: 60 | Fill #2

## 2015-09-03 MED FILL — OMEPRAZOLE DR 20 MG CAPSULE: 20 | 30 days supply | Qty: 30 | Fill #3

## 2015-09-03 MED FILL — metFORMIN HCL 1000 MG TABS: 1000 | 30 days supply | Qty: 60 | Fill #3

## 2015-09-17 ENCOUNTER — Ambulatory Visit: Payer: Self-pay | Attending: Internal Medicine | Admitting: Internal Medicine

## 2015-09-17 ENCOUNTER — Encounter: Payer: Self-pay | Admitting: Internal Medicine

## 2015-09-17 ENCOUNTER — Telehealth: Payer: Self-pay | Admitting: *Deleted

## 2015-09-17 VITALS — BP 109/77 | HR 90 | Temp 98.2°F | Resp 18 | Ht 64.0 in | Wt 149.0 lb

## 2015-09-17 DIAGNOSIS — E119 Type 2 diabetes mellitus without complications: Secondary | ICD-10-CM

## 2015-09-17 DIAGNOSIS — I10 Essential (primary) hypertension: Secondary | ICD-10-CM

## 2015-09-17 DIAGNOSIS — Z79899 Other long term (current) drug therapy: Secondary | ICD-10-CM | POA: Insufficient documentation

## 2015-09-17 DIAGNOSIS — E785 Hyperlipidemia, unspecified: Secondary | ICD-10-CM | POA: Insufficient documentation

## 2015-09-17 DIAGNOSIS — R634 Abnormal weight loss: Secondary | ICD-10-CM

## 2015-09-17 DIAGNOSIS — M5441 Lumbago with sciatica, right side: Secondary | ICD-10-CM

## 2015-09-17 DIAGNOSIS — Z7982 Long term (current) use of aspirin: Secondary | ICD-10-CM | POA: Insufficient documentation

## 2015-09-17 DIAGNOSIS — Z1239 Encounter for other screening for malignant neoplasm of breast: Secondary | ICD-10-CM

## 2015-09-17 DIAGNOSIS — R1011 Right upper quadrant pain: Secondary | ICD-10-CM

## 2015-09-17 DIAGNOSIS — R109 Unspecified abdominal pain: Secondary | ICD-10-CM | POA: Insufficient documentation

## 2015-09-17 DIAGNOSIS — M5431 Sciatica, right side: Secondary | ICD-10-CM | POA: Insufficient documentation

## 2015-09-17 DIAGNOSIS — M545 Low back pain: Secondary | ICD-10-CM | POA: Insufficient documentation

## 2015-09-17 LAB — POCT URINALYSIS DIPSTICK
BILIRUBIN UA: NEGATIVE
Glucose, UA: NEGATIVE
Ketones, UA: NEGATIVE
NITRITE UA: NEGATIVE
PH UA: 8.5
Protein, UA: NEGATIVE
RBC UA: NEGATIVE
SPEC GRAV UA: 1.015
Urobilinogen, UA: 0.2

## 2015-09-17 LAB — GLUCOSE, POCT (MANUAL RESULT ENTRY): POC GLUCOSE: 65 mg/dL — AB (ref 70–99)

## 2015-09-17 LAB — POCT GLYCOSYLATED HEMOGLOBIN (HGB A1C): Hemoglobin A1C: 6.8

## 2015-09-17 MED ORDER — METFORMIN HCL 500 MG PO TABS: 500.0000 mg | ORAL_TABLET | Freq: Two times a day (BID) | ORAL | Status: DC

## 2015-09-17 MED FILL — metFORMIN HCL 500 MG TABS: 500 | 30 days supply | Qty: 60 | Fill #0

## 2015-09-17 NOTE — Progress Notes (Signed)
Meredith Ryan, is a 55 y.o. female  ZOX:[id number]  [id number]  DOB - [birth year]  CC:  Chief Complaint  Patient presents with  . Generalized Body Aches       HPI: Meredith Ryan is a 55 y.o. female here today to establish medical care.  Last seen in our clinic 7/16, still taking all her meds.  Per pt, c/o of ruq /flank pain for about 1 wk now, hurts more when she is trying to lay down in bed, denies any cause of muscle strain/heavy lifiting.  Of note, denies any dysruria/hematuria.  Per pt and husband Pt has lost about 30lbs in the last 7 months or so, she "eats like a bird", and only picks at the food.  She is still taking all her meds.      Patient has No headache, No chest pain, No abdominal pain - No Nausea, No new weakness tingling or numbness, No Cough - SOB. No hematuria/hematochezia.    No Known Allergies Past Medical History  Diagnosis Date  . Anemia   . DM type 2 (diabetes mellitus, type 2) (HCC)   . HLD (hyperlipidemia)    Current Outpatient Prescriptions on File Prior to Visit  Medication Sig Dispense Refill  . aspirin 81 MG tablet Take 81 mg by mouth daily.    . ferrous sulfate 325 (65 FE) MG tablet TAKE ONE TABLET BY MOUTH ONCE DAILY WITH  BREAKFAST 30 tablet 11  . fluticasone (FLONASE) 50 MCG/ACT nasal spray Place 2 sprays into the nose daily. 16 g 6  . metoprolol tartrate (LOPRESSOR) 25 MG tablet TAKE 1 TABLET BY MOUTH 2 TIMES DAILY. 60 tablet 3  . Multiple Vitamins-Minerals (MULTIVITAMIN WITH MINERALS) tablet Take 1 tablet by mouth daily. 30 tablet 2  . nystatin cream (MYCOSTATIN) APPLY TOPICALLY TWO TIMES A DAY 30 g 0  . omeprazole (PRILOSEC) 20 MG capsule Take 1 capsule (20 mg total) by mouth daily. 30 capsule 3  . simvastatin (ZOCOR) 10 MG tablet Take 1 tablet (10 mg total) by mouth at bedtime. 90 tablet 3  . traZODone (DESYREL) 50 MG tablet Take 0.5-1 tablets (25-50 mg total) by mouth at bedtime as needed for sleep. 30 tablet 3  .  Diclofenac-Misoprostol (ARTHROTEC) 75-0.2 MG TBEC Take by mouth. Reported on 09/17/2015    . zolpidem (AMBIEN) 10 MG tablet Take 1 tablet (10 mg total) by mouth at bedtime as needed for sleep. 10 tablet 0   No current facility-administered medications on file prior to visit.   Family History  Problem Relation Age of Onset  . Diabetes Mother   . Diabetes Father    Social History   Social History  . Marital Status: Married    Spouse Name: N/A  . Number of Children: N/A  . Years of Education: N/A   Occupational History  . Not on file.   Social History Main Topics  . Smoking status: Never Smoker   . Smokeless tobacco: Not on file  . Alcohol Use: No  . Drug Use: No  . Sexual Activity: Not on file   Other Topics Concern  . Not on file   Social History Narrative   Lives with husband, 3 children. (23, 83, 28) Oldest daughter married.    Was working at [employer], but laid off.    Review of Systems: Constitutional: Negative for fever, chills, diaphoresis, activity change, + appetite change, eats very little, fatigue, approx 30lb weight loss in 7 months or so HENT: Negative for ear  pain, nosebleeds, congestion, facial swelling, rhinorrhea, neck pain, neck stiffness and ear discharge.  Eyes: Negative for pain, discharge, redness, itching and visual disturbance. Respiratory: Negative for cough, choking, chest tightness, shortness of breath, wheezing and stridor.  Cardiovascular: Negative for chest pain, palpitations and leg swelling. Gastrointestinal: Negative for abdominal distention. Genitourinary: Negative for dysuria, urgency, frequency, hematuria,  decreased urine volume, difficulty urinating and dyspareunia. + right flank pain  And ruq abd pain, non radiating, constant, worse when lying down. Musculoskeletal: Negative for back pain, joint swelling, arthralgia and gait problem. Neurological: Negative for dizziness, tremors, seizures, syncope, facial asymmetry, speech  difficulty, weakness, light-headedness, numbness and headaches.  Hematological: Negative for adenopathy. Does not bruise/bleed easily. Psychiatric/Behavioral: Negative for hallucinations, behavioral problems, confusion, dysphoric mood, decreased concentration and agitation.    Objective:   Filed Vitals:   09/17/15 1650  BP: 109/77  Pulse: 90  Temp: 98.2 F (36.8 C)  Resp: 18    Physical Exam: Constitutional: Patient appears well-developed and well-nourished. No distress.  Thin appearing female, AAOx3, no fine tremors noted. HENT: Normocephalic, atraumatic, External right and left ear normal. Oropharynx is clear and moist.  Eyes: Conjunctivae and EOM are normal. PERRL, no scleral icterus. Neck: Normal ROM. Neck supple. No JVD. No tracheal deviation. No thyromegaly. CVS: RRR, S1/S2 +, no murmurs, no gallops, no carotid bruit.  Pulmonary: Effort and breath sounds normal, no stridor, rhonchi, wheezes, rales.  Abdominal: Soft. BS +, no distension, + ttp ruq, Neg Murphy sign, No rebound or guarding.  No bilateral cva tenderness to palpation. Musculoskeletal: Normal range of motion. No edema and no tenderness.  Lymphadenopathy: No lymphadenopathy noted, cervical, inguinal or axillary Neuro: Alert. Normal reflexes, muscle tone coordination. No cranial nerve deficit grossly. Skin: Skin is warm and dry. No rash noted. Not diaphoretic. No erythema. No pallor. Psychiatric: Normal mood and affect. Behavior, judgment, thought content normal.  Lab Results  Component Value Date   WBC 6.9 05/16/2013   HGB 13.4 05/16/2013   HCT 40.5 05/16/2013   MCV 73.1* 05/16/2013   PLT 362 05/16/2013   Lab Results  Component Value Date   CREATININE 0.70 08/22/2014   BUN 9 08/22/2014   NA 138 08/22/2014   K 4.6 08/22/2014   CL 102 08/22/2014   CO2 27 08/22/2014    Lab Results  Component Value Date   HGBA1C 6.8 09/17/2015   Lipid Panel     Component Value Date/Time   CHOL 178 08/22/2014 1152    TRIG 107 08/22/2014 1152   HDL 48 08/22/2014 1152   CHOLHDL 3.7 08/22/2014 1152   VLDL 21 08/22/2014 1152   LDLCALC 109* 08/22/2014 1152       Assessment and plan:   1. Type 2 diabetes mellitus without complication, without long-term current use of insulin (HCC), w/ associated weight loss of about 30 lbs in last 7 months - suspect may be over medicated at this time - will stop glipizide, and reduce metformin to 500 bid for now. - POCT A1C  6.8 - Glucose (CBG) 65   2.  htn - soft bps today, suspect overmedicated now that she has lost the weight. - stop acei, continue bb for now.  3.  RUQ abdominal pain/right flank pain - DG Lumbar Spine Complete; Future - to r/o out spinal stenosis as cause of pain. - US Abdomen Complete; Future - r/o gb disease - Urinalysis Dipstick w/ mod leukesterases, but pt asx, +alkolotic as well. Will follow rest of labs, doubt uti, may be  due to weight loss/electrolyte abnmls instead -  chk cbc/cmp/bilirubin  4. Screening for breast cancer - MM Digital Screening; Future, advised to get done, she is due for this  5. Low back pain with right-sided sciatica, unspecified back pain laterality See above  6. Weightloss, suspect cause of alkalosis - will chk labs, meds changed.  7.  Health maitenance. - chk vit d as well   Return in about 2 weeks (around 10/01/2015).  Papsmear/fasting lipids next time.  The patient was given clear instructions to go to ER or return to medical center if symptoms don't improve, worsen or new problems develop. The patient verbalized understanding. The patient was told to call to get lab results if they haven't heard anything in the next week.      Pete Glatter, MD, MBA/MHA [facility] [place], [place] [phone]   09/17/2015, 5:09 PM

## 2015-09-17 NOTE — Progress Notes (Signed)
Patient is here for Body Aches  Patient complains of generalized body aches. Pain is present on the right side during the evening. Patient described pain as aching. Patient has not been able to eat or sleep.   Patient has taken medication today and patient has eaten.

## 2015-09-17 NOTE — Telephone Encounter (Signed)
MA spoke with patient and inquired about patient coming in for an earlier appointment. Patient states she is on the way to the office. No further questions at this time.

## 2015-09-17 NOTE — Patient Instructions (Addendum)
- US galbladder - back xrays   DASH Eating Plan DASH stands for "Dietary Approaches to Stop Hypertension." The DASH eating plan is a healthy eating plan that has been shown to reduce high blood pressure (hypertension). Additional health benefits may include reducing the risk of type 2 diabetes mellitus, heart disease, and stroke. The DASH eating plan may also help with weight loss. WHAT DO I NEED TO KNOW ABOUT THE DASH EATING PLAN? For the DASH eating plan, you will follow these general guidelines:  Choose foods with a percent daily value for sodium of less than 5% (as listed on the food label).  Use salt-free seasonings or herbs instead of table salt or sea salt.  Check with your health care provider or pharmacist before using salt substitutes.  Eat lower-sodium products, often labeled as "lower sodium" or "no salt added."  Eat fresh foods.  Eat more vegetables, fruits, and low-fat dairy products.  Choose whole grains. Look for the word "whole" as the first word in the ingredient list.  Choose fish and skinless chicken or [place] more often than red meat. Limit fish, poultry, and meat to 6 oz (170 g) each day.  Limit sweets, desserts, sugars, and sugary drinks.  Choose heart-healthy fats.  Limit cheese to 1 oz (28 g) per day.  Eat more home-cooked food and less restaurant, buffet, and fast food.  Limit fried foods.  Cook foods using methods other than frying.  Limit canned vegetables. If you do use them, rinse them well to decrease the sodium.  When eating at a restaurant, ask that your food be prepared with less salt, or no salt if possible. WHAT FOODS CAN I EAT? Seek help from a dietitian for individual calorie needs. Grains Whole grain or whole wheat bread. Brown rice. Whole grain or whole wheat pasta. Quinoa, bulgur, and whole grain cereals. Low-sodium cereals. Corn or whole wheat flour tortillas. Whole grain cornbread. Whole grain crackers. Low-sodium  crackers. Vegetables Fresh or frozen vegetables (raw, steamed, roasted, or grilled). Low-sodium or reduced-sodium tomato and vegetable juices. Low-sodium or reduced-sodium tomato sauce and paste. Low-sodium or reduced-sodium canned vegetables.  Fruits All fresh, canned (in natural juice), or frozen fruits. Meat and Other Protein Products Ground beef (85% or leaner), grass-fed beef, or beef trimmed of fat. Skinless chicken or [place]. Ground chicken or [place]. Pork trimmed of fat. All fish and seafood. Eggs. Dried beans, peas, or lentils. Unsalted nuts and seeds. Unsalted canned beans. Dairy Low-fat dairy products, such as skim or 1% milk, 2% or reduced-fat cheeses, low-fat ricotta or cottage cheese, or plain low-fat yogurt. Low-sodium or reduced-sodium cheeses. Fats and Oils Tub margarines without trans fats. Light or reduced-fat mayonnaise and salad dressings (reduced sodium). Avocado. Safflower, olive, or canola oils. Natural peanut or almond butter. Other Unsalted popcorn and pretzels. The items listed above may not be a complete list of recommended foods or beverages. Contact your dietitian for more options. WHAT FOODS ARE NOT RECOMMENDED? Grains White bread. White pasta. White rice. Refined cornbread. Bagels and croissants. Crackers that contain trans fat. Vegetables Creamed or fried vegetables. Vegetables in a cheese sauce. Regular canned vegetables. Regular canned tomato sauce and paste. Regular tomato and vegetable juices. Fruits Dried fruits. Canned fruit in light or heavy syrup. Fruit juice. Meat and Other Protein Products Fatty cuts of meat. Ribs, chicken wings, bacon, sausage, bologna, salami, chitterlings, fatback, hot dogs, bratwurst, and packaged luncheon meats. Salted nuts and seeds. Canned beans with salt. Dairy Whole or 2% milk, cream, half-and-half,  and cream cheese. Whole-fat or sweetened yogurt. Full-fat cheeses or blue cheese. Nondairy creamers and whipped toppings.  Processed cheese, cheese spreads, or cheese curds. Condiments Onion and garlic salt, seasoned salt, table salt, and sea salt. Canned and packaged gravies. Worcestershire sauce. Tartar sauce. Barbecue sauce. Teriyaki sauce. Soy sauce, including reduced sodium. Steak sauce. Fish sauce. Oyster sauce. Cocktail sauce. Horseradish. Ketchup and mustard. Meat flavorings and tenderizers. Bouillon cubes. Hot sauce. Tabasco sauce. Marinades. Taco seasonings. Relishes. Fats and Oils Butter, stick margarine, lard, shortening, ghee, and bacon fat. Coconut, palm kernel, or palm oils. Regular salad dressings. Other Pickles and olives. Salted popcorn and pretzels. The items listed above may not be a complete list of foods and beverages to avoid. Contact your dietitian for more information. WHERE CAN I FIND MORE INFORMATION? National Heart, Lung, and Blood Institute: [employer]   This information is not intended to replace advice given to you by your health care provider. Make sure you discuss any questions you have with your health care provider.   Document Released: 05/22/2011 Document Revised: 06/23/2014 Document Reviewed: 04/06/2013 Elsevier Interactive Patient Education 2016 [employer]. Diabetes Mellitus and Food It is important for you to manage your blood sugar (glucose) level. Your blood glucose level can be greatly affected by what you eat. Eating healthier foods in the appropriate amounts throughout the day at about the same time each day will help you control your blood glucose level. It can also help slow or prevent worsening of your diabetes mellitus. Healthy eating may even help you improve the level of your blood pressure and reach or maintain a healthy weight.  General recommendations for healthful eating and cooking habits include:  Eating meals and snacks regularly. Avoid going long periods of time without eating to lose weight.  Eating a diet that  consists mainly of plant-based foods, such as fruits, vegetables, nuts, legumes, and whole grains.  Using low-heat cooking methods, such as baking, instead of high-heat cooking methods, such as deep frying. Work with your dietitian to make sure you understand how to use the Nutrition Facts information on food labels. HOW CAN FOOD AFFECT ME? Carbohydrates Carbohydrates affect your blood glucose level more than any other type of food. Your dietitian will help you determine how many carbohydrates to eat at each meal and teach you how to count carbohydrates. Counting carbohydrates is important to keep your blood glucose at a healthy level, especially if you are using insulin or taking certain medicines for diabetes mellitus. Alcohol Alcohol can cause sudden decreases in blood glucose (hypoglycemia), especially if you use insulin or take certain medicines for diabetes mellitus. Hypoglycemia can be a life-threatening condition. Symptoms of hypoglycemia (sleepiness, dizziness, and disorientation) are similar to symptoms of having too much alcohol.  If your health care provider has given you approval to drink alcohol, do so in moderation and use the following guidelines:  Women should not have more than one drink per day, and men should not have more than two drinks per day. One drink is equal to:  12 oz of beer.  5 oz of wine.  1 oz of hard liquor.  Do not drink on an empty stomach.  Keep yourself hydrated. Have water, diet soda, or unsweetened iced tea.  Regular soda, juice, and other mixers might contain a lot of carbohydrates and should be counted. WHAT FOODS ARE NOT RECOMMENDED? As you make food choices, it is important to remember that all foods are not the same. Some foods have fewer nutrients  per serving than other foods, even though they might have the same number of calories or carbohydrates. It is difficult to get your body what it needs when you eat foods with fewer nutrients. Examples  of foods that you should avoid that are high in calories and carbohydrates but low in nutrients include:  Trans fats (most processed foods list trans fats on the Nutrition Facts label).  Regular soda.  Juice.  Candy.  Sweets, such as cake, pie, doughnuts, and cookies.  Fried foods. WHAT FOODS CAN I EAT? Eat nutrient-rich foods, which will nourish your body and keep you healthy. The food you should eat also will depend on several factors, including:  The calories you need.  The medicines you take.  Your weight.  Your blood glucose level.  Your blood pressure level.  Your cholesterol level. You should eat a variety of foods, including:  Protein.  Lean cuts of meat.  Proteins low in saturated fats, such as fish, egg whites, and beans. Avoid processed meats.  Fruits and vegetables.  Fruits and vegetables that may help control blood glucose levels, such as apples, mangoes, and yams.  Dairy products.  Choose fat-free or low-fat dairy products, such as milk, yogurt, and cheese.  Grains, bread, pasta, and rice.  Choose whole grain products, such as multigrain bread, whole oats, and brown rice. These foods may help control blood pressure.  Fats.  Foods containing healthful fats, such as nuts, avocado, olive oil, canola oil, and fish. DOES EVERYONE WITH DIABETES MELLITUS HAVE THE SAME MEAL PLAN? Because every person with diabetes mellitus is different, there is not one meal plan that works for everyone. It is very important that you meet with a dietitian who will help you create a meal plan that is just right for you.   This information is not intended to replace advice given to you by your health care provider. Make sure you discuss any questions you have with your health care provider.   Document Released: 02/27/2005 Document Revised: 06/23/2014 Document Reviewed: 04/29/2013 Elsevier Interactive Patient Education [employer]

## 2015-09-18 ENCOUNTER — Telehealth: Payer: Self-pay | Admitting: *Deleted

## 2015-09-18 LAB — CBC WITH DIFFERENTIAL/PLATELET
BASOS ABS: 0 {cells}/uL (ref 0–200)
Basophils Relative: 0 %
EOS ABS: 234 {cells}/uL (ref 15–500)
EOS PCT: 3 %
HEMATOCRIT: 37.8 % (ref 35.0–45.0)
HEMOGLOBIN: 12.4 g/dL (ref 11.7–15.5)
LYMPHS ABS: 3120 {cells}/uL (ref 850–3900)
Lymphocytes Relative: 40 %
MCH: 24.8 pg — AB (ref 27.0–33.0)
MCHC: 32.8 g/dL (ref 32.0–36.0)
MCV: 75.4 fL — AB (ref 80.0–100.0)
MONO ABS: 546 {cells}/uL (ref 200–950)
MPV: 9.2 fL (ref 7.5–12.5)
Monocytes Relative: 7 %
NEUTROS ABS: 3900 {cells}/uL (ref 1500–7800)
NEUTROS PCT: 50 %
Platelets: 340 10*3/uL (ref 140–400)
RBC: 5.01 MIL/uL (ref 3.80–5.10)
RDW: 16 % — ABNORMAL HIGH (ref 11.0–15.0)
WBC: 7.8 10*3/uL (ref 3.8–10.8)

## 2015-09-18 LAB — CMP AND LIVER
ALBUMIN: 4.3 g/dL (ref 3.6–5.1)
ALT: 11 U/L (ref 6–29)
AST: 18 U/L (ref 10–35)
Alkaline Phosphatase: 68 U/L (ref 33–130)
BUN: 13 mg/dL (ref 7–25)
Bilirubin, Direct: <0.1 mg/dL (ref <=0.2)
CALCIUM: 9.3 mg/dL (ref 8.6–10.4)
CO2: 25 mmol/L (ref 20–31)
Chloride: 102 mmol/L (ref 98–110)
Creat: 0.57 mg/dL (ref 0.50–1.05)
GLUCOSE: 65 mg/dL (ref 65–99)
POTASSIUM: 4.4 mmol/L (ref 3.5–5.3)
Sodium: 138 mmol/L (ref 135–146)
TOTAL PROTEIN: 7.2 g/dL (ref 6.1–8.1)
Total Bilirubin: 0.2 mg/dL (ref 0.2–1.2)

## 2015-09-18 LAB — INFLUENZA A & B PCR
Influenza A: NOT DETECTED
Influenza B: NOT DETECTED

## 2015-09-18 LAB — VITAMIN D 25 HYDROXY (VIT D DEFICIENCY, FRACTURES): Vit D, 25-Hydroxy: 44 ng/mL (ref 30–100)

## 2015-09-18 LAB — TSH: TSH: 3.74 m[IU]/L

## 2015-09-18 NOTE — Telephone Encounter (Signed)
Medical Assistant left message on patient's home and cell voicemail. Voicemail states to give a call back to Jony Ladnier with [facility] at [phone].  

## 2015-09-18 NOTE — Telephone Encounter (Signed)
-----   Message from Pete Glatterawn T Langeland, MD sent at 09/18/2015  9:25 AM EDT ----- Please call patient.  I was very pleasantly surprised with her labs.  i was extremely worried all her electrolytes would be off, but everything looks great.  Kidney and liver test all normal, so signs of stone obstruction via the labs.  We will wait for the galbladder US to confirm. Her thyroid and vit d levels were nml as well.   Tell her to take her metformin 500mg  2x a day for me now since her labs all normal. No signs of anemia.   Thanks.

## 2015-09-21 ENCOUNTER — Ambulatory Visit (HOSPITAL_COMMUNITY)
Admission: RE | Admit: 2015-09-21 | Discharge: 2015-09-21 | Disposition: A | Discharge status: Home / Self Care | Payer: Self-pay | Source: Ambulatory Visit | Attending: Internal Medicine | Admitting: Internal Medicine

## 2015-09-21 ENCOUNTER — Ambulatory Visit (HOSPITAL_COMMUNITY)
Admission: RE | Admit: 2015-09-21 | Discharge: 2015-09-21 | Disposition: A | Discharge status: Home / Self Care | Payer: Medicaid Other | Source: Ambulatory Visit | Attending: Internal Medicine | Admitting: Internal Medicine

## 2015-09-21 DIAGNOSIS — R1011 Right upper quadrant pain: Secondary | ICD-10-CM | POA: Insufficient documentation

## 2015-09-21 DIAGNOSIS — K8020 Calculus of gallbladder without cholecystitis without obstruction: Secondary | ICD-10-CM | POA: Insufficient documentation

## 2015-09-21 DIAGNOSIS — M479 Spondylosis, unspecified: Secondary | ICD-10-CM | POA: Insufficient documentation

## 2015-09-21 DIAGNOSIS — M5441 Lumbago with sciatica, right side: Secondary | ICD-10-CM | POA: Insufficient documentation

## 2015-09-21 DIAGNOSIS — M419 Scoliosis, unspecified: Secondary | ICD-10-CM | POA: Insufficient documentation

## 2015-09-27 NOTE — Telephone Encounter (Signed)
Medical Assistant left message on patient's home and cell voicemail. Voicemail states to give a call back to Nubia with [facility] at [phone].  

## 2015-09-27 NOTE — Telephone Encounter (Signed)
-----   Message from Pete Glatterawn T Langeland, MD sent at 09/19/2015  3:22 PM EDT ----- Please call patient w/ news that she did not have flu either. Will f/u w/ her Ultrasounds/xrays. thanks

## 2015-10-01 ENCOUNTER — Other Ambulatory Visit (HOSPITAL_COMMUNITY)
Admission: RE | Admit: 2015-10-01 | Discharge: 2015-10-01 | Disposition: A | Discharge status: Home / Self Care | Payer: Medicaid Other | Source: Ambulatory Visit | Attending: Internal Medicine | Admitting: Internal Medicine

## 2015-10-01 ENCOUNTER — Encounter: Payer: Self-pay | Admitting: Internal Medicine

## 2015-10-01 ENCOUNTER — Ambulatory Visit: Payer: Self-pay | Attending: Internal Medicine | Admitting: Internal Medicine

## 2015-10-01 VITALS — BP 113/76 | HR 100 | Temp 98.0°F | Resp 18 | Ht 64.0 in | Wt 145.0 lb

## 2015-10-01 DIAGNOSIS — Z124 Encounter for screening for malignant neoplasm of cervix: Secondary | ICD-10-CM

## 2015-10-01 DIAGNOSIS — Z1272 Encounter for screening for malignant neoplasm of vagina: Secondary | ICD-10-CM | POA: Insufficient documentation

## 2015-10-01 DIAGNOSIS — E785 Hyperlipidemia, unspecified: Secondary | ICD-10-CM | POA: Insufficient documentation

## 2015-10-01 DIAGNOSIS — Z79899 Other long term (current) drug therapy: Secondary | ICD-10-CM | POA: Insufficient documentation

## 2015-10-01 DIAGNOSIS — Z7982 Long term (current) use of aspirin: Secondary | ICD-10-CM | POA: Insufficient documentation

## 2015-10-01 DIAGNOSIS — Z7984 Long term (current) use of oral hypoglycemic drugs: Secondary | ICD-10-CM | POA: Insufficient documentation

## 2015-10-01 DIAGNOSIS — M549 Dorsalgia, unspecified: Secondary | ICD-10-CM | POA: Insufficient documentation

## 2015-10-01 DIAGNOSIS — R87810 Cervical high risk human papillomavirus (HPV) DNA test positive: Secondary | ICD-10-CM | POA: Insufficient documentation

## 2015-10-01 DIAGNOSIS — Z01411 Encounter for gynecological examination (general) (routine) with abnormal findings: Secondary | ICD-10-CM | POA: Insufficient documentation

## 2015-10-01 DIAGNOSIS — Z1151 Encounter for screening for human papillomavirus (HPV): Secondary | ICD-10-CM | POA: Insufficient documentation

## 2015-10-01 DIAGNOSIS — Z0000 Encounter for general adult medical examination without abnormal findings: Secondary | ICD-10-CM

## 2015-10-01 DIAGNOSIS — N760 Acute vaginitis: Secondary | ICD-10-CM | POA: Insufficient documentation

## 2015-10-01 DIAGNOSIS — R1011 Right upper quadrant pain: Secondary | ICD-10-CM | POA: Insufficient documentation

## 2015-10-01 DIAGNOSIS — E119 Type 2 diabetes mellitus without complications: Secondary | ICD-10-CM

## 2015-10-01 LAB — LIPID PANEL
CHOLESTEROL: 157 mg/dL (ref 125–200)
HDL: 50 mg/dL (ref >=46)
LDL Cholesterol: 73 mg/dL (ref <130)
Total CHOL/HDL Ratio: 3.1 Ratio (ref <=5.0)
Triglycerides: 170 mg/dL — ABNORMAL HIGH (ref <150)
VLDL: 34 mg/dL — AB (ref <30)

## 2015-10-01 LAB — GLUCOSE, POCT (MANUAL RESULT ENTRY): POC GLUCOSE: 119 mg/dL — AB (ref 70–99)

## 2015-10-01 MED ORDER — GLYBURIDE 2.5 MG PO TABS: 2.5000 mg | ORAL_TABLET | Freq: Every day | ORAL | Status: DC

## 2015-10-01 MED FILL — glyBURIDE 2.5 MG TABS: 2.5 | 30 days supply | Qty: 30 | Fill #0

## 2015-10-01 NOTE — Progress Notes (Signed)
Patient is here for Pap  Patient denies pain at this time. 

## 2015-10-01 NOTE — Progress Notes (Addendum)
Meredith Ryan, is a 55 y.o. female  [id number]  [id number]  DOB - Jun 18, 1960  Chief Complaint  Patient presents with  . Follow-up        Subjective:   Meredith Ryan is a 55 y.o. female here today for a follow up visit.  Doing well since last vist. Abd/back pain much improved.  Thinks may be due to standing up at work for few hours a day.  Eating bit more since metformin decreased last, less signs of anorexia.  She has mammogram set up for 4/24 per pt/husband.  Husband here w/ pt.  Last papsmear in 2011, ammendible to getting it done now.   Patient has No headache, No chest pain, No abdominal pain - No Nausea, No new weakness tingling or numbness, No Cough - SOB.  No problems updated.  ALLERGIES: No Known Allergies  PAST MEDICAL HISTORY: Past Medical History  Diagnosis Date  . Anemia   . DM type 2 (diabetes mellitus, type 2) (HCC)   . HLD (hyperlipidemia)     MEDICATIONS AT HOME: Prior to Admission medications   Medication Sig Start Date End Date Taking? Authorizing Provider  aspirin 81 MG tablet Take 81 mg by mouth daily.    Historical Provider, MD  Diclofenac-Misoprostol (ARTHROTEC) 75-0.2 MG TBEC Take by mouth. Reported on 09/17/2015    Historical Provider, MD  ferrous sulfate 325 (65 FE) MG tablet TAKE ONE TABLET BY MOUTH ONCE DAILY WITH  BREAKFAST 06/16/13   Hilarie Fredrickson, MD  fluticasone (FLONASE) 50 MCG/ACT nasal spray Place 2 sprays into the nose daily. 10/12/12   Amber Nydia Bouton, MD  glyBURIDE (DIABETA) 2.5 MG tablet Take 1 tablet (2.5 mg total) by mouth daily with breakfast. 10/01/15   Pete Glatter, MD  metFORMIN (GLUCOPHAGE) 500 MG tablet Take 1 tablet (500 mg total) by mouth 2 (two) times daily with a meal. 09/17/15   Pete Glatter, MD  metoprolol tartrate (LOPRESSOR) 25 MG tablet TAKE 1 TABLET BY MOUTH 2 TIMES DAILY. 08/22/14   Doris Cheadle, MD  Multiple Vitamins-Minerals (MULTIVITAMIN WITH MINERALS) tablet Take 1 tablet by mouth daily. 05/19/14    Doris Cheadle, MD  nystatin cream (MYCOSTATIN) APPLY TOPICALLY TWO TIMES A DAY 06/07/12   Hilarie Fredrickson, MD  omeprazole (PRILOSEC) 20 MG capsule Take 1 capsule (20 mg total) by mouth daily. 01/09/15   Doris Cheadle, MD  simvastatin (ZOCOR) 10 MG tablet Take 1 tablet (10 mg total) by mouth at bedtime. 01/09/15   Doris Cheadle, MD  traZODone (DESYREL) 50 MG tablet Take 0.5-1 tablets (25-50 mg total) by mouth at bedtime as needed for sleep. 10/12/12   Amber Nydia Bouton, MD  zolpidem (AMBIEN) 10 MG tablet Take 1 tablet (10 mg total) by mouth at bedtime as needed for sleep. 05/25/12 06/24/12  Linna Hoff, MD     Objective:   There were no vitals filed for this visit.  Exam General appearance : Awake, alert, not in any distress. Speech Clear. Not toxic looking HEENT: Atraumatic and Normocephalic, pupils equally reactive to light. Neck: supple, no JVD. No cervical lymphadenopathy.  Chest: Good air entry bilaterally, no added sounds. CVS: S1 S2 regular, no murmurs/gallups or rubs. Abdomen: Bowel sounds active, obese, Non tender and not distended with no gaurding, rigidity or rebound. Pelvic Exam: +cicumsised clitoris.  Cervix normal in appearance, retroverted posteriorly, no adnexal masses or tenderness, no cervical motion tenderness, rectovaginal septum normal, uterus normal size, shape, and consistency and vagina with small white  discharge.  No fishy odor.  Extremities: B/L Lower Ext shows no edema, both legs are warm to touch Neurology: Awake alert, and oriented X 3, CN II-XII grossly intact, Non focal Skin:No Rash   Papsmear exam was chaperoned with husband and CMA.  Data Review Lab Results  Component Value Date   HGBA1C 6.8 09/17/2015   HGBA1C 6.40 01/09/2015   HGBA1C 6.60 08/22/2014     Assessment & Plan   1. DM type 2, goal A1c below 7  a1c 6.8, doing ok, but since we decreased metformin to 500mg  bid last appt, concern will worsen. Will start glyburide 2.5 mg po qday today,  signs/sx of hypoglycemia discussed w/ pt/husband.   - Lipid Panel today (last ate breakfast time)  2. Health care maintenance Has mm set up for 4/24 per pt. Labs and studies personally reviewed again with pt and husband.   3. Pap smear for cervical cancer screening - Cytology - PAP today.  - ?yeast overgrowth, pt denies pruritis/usual discharge.  Will f/u w/ labs.   4. Back pain/ruq abd pain - appears resolved, +oa on lumbar films, +gb, but no acute signs of cholecystis - recd avoid fatty foods, warm heat for back pain if needed.   Patient have been counseled extensively about nutrition and exercise  Fu 3months  The patient was given clear instructions to go to ER or return to medical center if symptoms don't improve, worsen or new problems develop. The patient verbalized understanding. The patient was told to call to get lab results if they haven't heard anything in the next week.    Pete Glatterawn T Liset Mcmonigle, MD, MBA/MHA [facility] [place], [place] [phone]   10/01/2015, 2:56 PM

## 2015-10-01 NOTE — Addendum Note (Signed)
Addended by: Margaretmary LombardLISBON, Joei Frangos K on: 10/01/2015 04:17 PM   Modules accepted: Orders

## 2015-10-02 LAB — CERVICOVAGINAL ANCILLARY ONLY: WET PREP (BD AFFIRM): NEGATIVE

## 2015-10-02 LAB — CYTOLOGY - PAP

## 2015-10-04 ENCOUNTER — Ambulatory Visit: Payer: Medicaid Other

## 2015-10-08 MED FILL — ?SIMVASTATIN 10 MG TABLET: 10 MG | 30 days supply | Qty: 30 | Fill #4

## 2015-10-10 ENCOUNTER — Ambulatory Visit
Admission: RE | Admit: 2015-10-10 | Discharge: 2015-10-10 | Disposition: A | Discharge status: Home / Self Care | Payer: Medicaid Other | Source: Ambulatory Visit | Attending: Internal Medicine | Admitting: Internal Medicine

## 2015-10-10 DIAGNOSIS — Z1239 Encounter for other screening for malignant neoplasm of breast: Secondary | ICD-10-CM

## 2015-10-15 ENCOUNTER — Ambulatory Visit: Payer: Medicaid Other | Attending: Internal Medicine

## 2015-10-16 ENCOUNTER — Telehealth: Payer: Self-pay | Admitting: *Deleted

## 2015-10-16 NOTE — Telephone Encounter (Signed)
Medical Assistant left message on patient's home and cell voicemail. Voicemail states to give a call back to Dorita Rowlands with [facility] at [phone].  

## 2015-10-16 NOTE — Telephone Encounter (Signed)
Patient verified DOB Patient is aware of her mammogram showing no evidence of malignancy. Patient has been advised to have repeat completed in one year. No further questions or concerns at this time.

## 2015-10-16 NOTE — Telephone Encounter (Signed)
-----   Message from Pete Glatterawn T Langeland, MD sent at 10/11/2015 11:17 PM EDT ----- Please call pt that her mammogram was normal, no concerning findings. Will need repeat in 1 year. Thanks.

## 2015-10-23 MED FILL — metFORMIN HCL 500 MG TABS: 500 | 30 days supply | Qty: 60 | Fill #1

## 2015-10-29 MED FILL — ?METFORMIN HCL 1,000 MG TAB: 1000 | 30 days supply | Qty: 60 | Fill #4

## 2015-11-06 MED FILL — glyBURIDE 2.5 MG TABS: 2.5 | 30 days supply | Qty: 30 | Fill #1

## 2015-11-08 MED FILL — ?OMEPRAZOLE DR 20 MG CAPSUL: 20 | 30 days supply | Qty: 30 | Fill #0

## 2015-11-08 MED FILL — SIMVASTATIN 10 MG TABLET: 10 | 30 days supply | Qty: 30 | Fill #5

## 2015-11-26 MED FILL — metFORMIN HCL 500 MG TABS: 500 | 30 days supply | Qty: 60 | Fill #2

## 2015-11-27 ENCOUNTER — Telehealth: Payer: Self-pay | Admitting: Internal Medicine

## 2015-11-27 DIAGNOSIS — I10 Essential (primary) hypertension: Secondary | ICD-10-CM

## 2015-11-27 MED ORDER — METOPROLOL TARTRATE 25 MG PO TABS: ORAL_TABLET | ORAL | Status: DC

## 2015-11-27 MED FILL — METOPROLOL TARTRATE 25 MG T: 25 | 30 days supply | Qty: 60 | Fill #0

## 2015-11-27 NOTE — Telephone Encounter (Signed)
Pt. Came into facility requesting a refill on metoprolol tartrate (LOPRESSOR) 25 MG tablet .  Pt. Was seen on 10/01/15. Please f/u

## 2015-11-27 NOTE — Telephone Encounter (Signed)
Metoprolol tartrate 25 mg was refilled.

## 2015-11-30 ENCOUNTER — Telehealth: Payer: Self-pay | Admitting: Internal Medicine

## 2015-11-30 MED FILL — glyBURIDE 2.5 MG TABS: 2.5 | 30 days supply | Qty: 30 | Fill #2

## 2015-11-30 NOTE — Telephone Encounter (Signed)
Pt and Pts daughter came in with questions concerning the blood pressure medication she was prescribed Pts daughter was concerned because her mother's blood pressure was not discussed in the last visit  Pts daughter would like to know if she should continue to take this medication or not.  Please assist. Thank you

## 2015-12-06 NOTE — Telephone Encounter (Signed)
Patient verified DOB Patients daughter and husband are aware of metoprolol being on the patients medication list since 2014. The patients daughter states the patient has not been taking the medication. She has never picked up a BP medication until this current refill. Patients daughter was advised to call on 12/14/15 to schedule an July appointment and discuss possible start or stop of the BP medication. Patients daughter expressed her understanding and had no further questions at this time.

## 2015-12-19 MED FILL — SIMVASTATIN 10 MG TABLET: 10 | 30 days supply | Qty: 30 | Fill #6

## 2015-12-31 MED FILL — metFORMIN HCL 500 MG TABS: 500 | 30 days supply | Qty: 60 | Fill #3

## 2016-01-09 MED FILL — glyBURIDE 2.5 MG TABS: 2.5 | 30 days supply | Qty: 30 | Fill #3

## 2016-01-24 ENCOUNTER — Other Ambulatory Visit: Payer: Self-pay | Admitting: Pharmacist

## 2016-01-24 DIAGNOSIS — E785 Hyperlipidemia, unspecified: Secondary | ICD-10-CM

## 2016-01-24 MED ORDER — SIMVASTATIN 10 MG PO TABS: 10.0000 mg | ORAL_TABLET | Freq: Every day | ORAL | 0 refills | Status: DC

## 2016-01-24 MED FILL — ?SIMVASTATIN 10 MG TABLET: 10 | 30 days supply | Qty: 30 | Fill #0

## 2016-02-11 ENCOUNTER — Other Ambulatory Visit: Payer: Self-pay | Admitting: Pharmacist

## 2016-02-11 DIAGNOSIS — K219 Gastro-esophageal reflux disease without esophagitis: Secondary | ICD-10-CM

## 2016-02-11 MED ORDER — OMEPRAZOLE 20 MG PO CPDR: 20.0000 mg | DELAYED_RELEASE_CAPSULE | Freq: Every day | ORAL | 0 refills | Status: DC

## 2016-02-11 MED FILL — ?OMEPRAZOLE DR 20 MG CAPSUL: 20 | 30 days supply | Qty: 30 | Fill #0

## 2016-02-11 MED FILL — glyBURIDE 2.5 MG TABS: 2.5 | 30 days supply | Qty: 30 | Fill #0

## 2016-02-13 ENCOUNTER — Other Ambulatory Visit: Payer: Self-pay | Admitting: *Deleted

## 2016-02-13 ENCOUNTER — Telehealth: Payer: Self-pay | Admitting: *Deleted

## 2016-02-14 ENCOUNTER — Other Ambulatory Visit: Payer: Self-pay | Admitting: Pharmacist

## 2016-02-14 DIAGNOSIS — K219 Gastro-esophageal reflux disease without esophagitis: Secondary | ICD-10-CM

## 2016-02-14 MED ORDER — OMEPRAZOLE 20 MG PO CPDR: 20.0000 mg | DELAYED_RELEASE_CAPSULE | Freq: Every day | ORAL | 0 refills | Status: DC

## 2016-02-14 MED FILL — metFORMIN HCL 500 MG TABS: 500 | 30 days supply | Qty: 60 | Fill #4

## 2016-02-14 MED FILL — ?SIMVASTATIN 10 MG TABLET: 10 | 30 days supply | Qty: 30 | Fill #1

## 2016-02-25 ENCOUNTER — Other Ambulatory Visit: Payer: Self-pay | Admitting: *Deleted

## 2016-02-25 MED ORDER — METFORMIN HCL 500 MG PO TABS: 500.0000 mg | ORAL_TABLET | Freq: Two times a day (BID) | ORAL | 3 refills | Status: DC

## 2016-02-25 NOTE — Telephone Encounter (Signed)
Patients metformin was refilled today.

## 2016-02-27 NOTE — Telephone Encounter (Signed)
Error

## 2016-03-10 MED FILL — glyBURIDE 2.5 MG TABS: 2.5 | 30 days supply | Qty: 30 | Fill #1

## 2016-04-02 MED FILL — SIMVASTATIN 10 MG TABLET: 10 | 30 days supply | Qty: 30 | Fill #2

## 2016-04-02 MED FILL — metFORMIN HCL 500 MG TABS: 500 | 30 days supply | Qty: 60 | Fill #5

## 2016-04-02 MED FILL — ?OMEPRAZOLE DR 20 MG CAPSUL: 20 | 30 days supply | Qty: 30 | Fill #0

## 2016-04-21 MED FILL — glyBURIDE 2.5 MG TABS: 2.5 | 30 days supply | Qty: 30 | Fill #2

## 2016-05-05 ENCOUNTER — Telehealth: Payer: Self-pay | Admitting: Internal Medicine

## 2016-05-05 ENCOUNTER — Other Ambulatory Visit: Payer: Self-pay | Admitting: Internal Medicine

## 2016-05-05 DIAGNOSIS — E785 Hyperlipidemia, unspecified: Secondary | ICD-10-CM

## 2016-05-05 MED FILL — metFORMIN HCL 500 MG TABS: 500 | 30 days supply | Qty: 60 | Fill #6

## 2016-05-05 MED FILL — SIMVASTATIN 10 MG TABLET: 10 | 30 days supply | Qty: 30 | Fill #0

## 2016-05-05 NOTE — Telephone Encounter (Signed)
Patient is needing simvastatin. Please follow up.

## 2016-05-05 NOTE — Telephone Encounter (Signed)
Simvastatin has already been refilled and sent to pharmacy earlier today.

## 2016-06-02 MED FILL — glyBURIDE 2.5 MG TABS: 2.5 | 30 days supply | Qty: 30 | Fill #3

## 2016-06-02 MED FILL — ?METFORMIN HCL 500MG TABLET: 500 | 30 days supply | Qty: 60 | Fill #7

## 2016-06-17 ENCOUNTER — Ambulatory Visit: Payer: Self-pay | Attending: Internal Medicine | Admitting: Internal Medicine

## 2016-06-17 ENCOUNTER — Encounter: Payer: Self-pay | Admitting: Internal Medicine

## 2016-06-17 VITALS — BP 117/78 | HR 88 | Temp 97.7°F | Resp 16 | Wt 147.0 lb

## 2016-06-17 DIAGNOSIS — R634 Abnormal weight loss: Secondary | ICD-10-CM | POA: Insufficient documentation

## 2016-06-17 DIAGNOSIS — Z7982 Long term (current) use of aspirin: Secondary | ICD-10-CM | POA: Insufficient documentation

## 2016-06-17 DIAGNOSIS — Z1159 Encounter for screening for other viral diseases: Secondary | ICD-10-CM

## 2016-06-17 DIAGNOSIS — E559 Vitamin D deficiency, unspecified: Secondary | ICD-10-CM | POA: Insufficient documentation

## 2016-06-17 DIAGNOSIS — I10 Essential (primary) hypertension: Secondary | ICD-10-CM | POA: Insufficient documentation

## 2016-06-17 DIAGNOSIS — E119 Type 2 diabetes mellitus without complications: Secondary | ICD-10-CM | POA: Insufficient documentation

## 2016-06-17 DIAGNOSIS — Z7984 Long term (current) use of oral hypoglycemic drugs: Secondary | ICD-10-CM | POA: Insufficient documentation

## 2016-06-17 DIAGNOSIS — E785 Hyperlipidemia, unspecified: Secondary | ICD-10-CM | POA: Insufficient documentation

## 2016-06-17 DIAGNOSIS — Z23 Encounter for immunization: Secondary | ICD-10-CM | POA: Insufficient documentation

## 2016-06-17 DIAGNOSIS — Z114 Encounter for screening for human immunodeficiency virus [HIV]: Secondary | ICD-10-CM

## 2016-06-17 LAB — CBC WITH DIFFERENTIAL/PLATELET
BASOS PCT: 1 %
Basophils Absolute: 79 cells/uL (ref 0–200)
EOS ABS: 316 {cells}/uL (ref 15–500)
EOS PCT: 4 %
HCT: 42.4 % (ref 35.0–45.0)
HEMOGLOBIN: 13.5 g/dL (ref 11.7–15.5)
LYMPHS ABS: 3002 {cells}/uL (ref 850–3900)
Lymphocytes Relative: 38 %
MCH: 24.6 pg — ABNORMAL LOW (ref 27.0–33.0)
MCHC: 31.8 g/dL — ABNORMAL LOW (ref 32.0–36.0)
MCV: 77.4 fL — ABNORMAL LOW (ref 80.0–100.0)
MONOS PCT: 6 %
MPV: 9 fL (ref 7.5–12.5)
Monocytes Absolute: 474 cells/uL (ref 200–950)
NEUTROS ABS: 4029 {cells}/uL (ref 1500–7800)
Neutrophils Relative %: 51 %
PLATELETS: 329 10*3/uL (ref 140–400)
RBC: 5.48 MIL/uL — ABNORMAL HIGH (ref 3.80–5.10)
RDW: 15.6 % — ABNORMAL HIGH (ref 11.0–15.0)
WBC: 7.9 10*3/uL (ref 3.8–10.8)

## 2016-06-17 LAB — GLUCOSE, POCT (MANUAL RESULT ENTRY): POC GLUCOSE: 76 mg/dL (ref 70–99)

## 2016-06-17 LAB — POCT GLYCOSYLATED HEMOGLOBIN (HGB A1C): HEMOGLOBIN A1C: 6.8

## 2016-06-17 MED ORDER — METFORMIN HCL 500 MG PO TABS: 500.0000 mg | ORAL_TABLET | Freq: Every day | ORAL | 3 refills | Status: DC

## 2016-06-17 MED ORDER — MELATONIN 5 MG PO CAPS: 1.0000 | ORAL_CAPSULE | Freq: Every day | ORAL | 3 refills | Status: DC

## 2016-06-17 MED ORDER — PRAVASTATIN SODIUM 20 MG PO TABS: 20.0000 mg | ORAL_TABLET | Freq: Every day | ORAL | 3 refills | Status: DC

## 2016-06-17 MED ORDER — ASPIRIN 81 MG PO TABS: 81.0000 mg | ORAL_TABLET | Freq: Every day | ORAL | 3 refills | Status: DC

## 2016-06-17 MED FILL — PRAVASTATIN NA 20 MG TAB: 20 | 30 days supply | Qty: 30 | Fill #0

## 2016-06-17 NOTE — Patient Instructions (Addendum)
Calcium 1200 mg + Vit D 800 IU /daily  Pneumococcal Polysaccharide Vaccine: What You Need to Know 1. Why get vaccinated? Vaccination can protect older adults (and some children and younger adults) from pneumococcal disease. Pneumococcal disease is caused by bacteria that can spread from person to person through close contact. It can cause ear infections, and it can also lead to more serious infections of the:  Lungs (pneumonia),  Blood (bacteremia), and  Covering of the brain and spinal cord (meningitis). Meningitis can cause deafness and brain damage, and it can be fatal. Anyone can get pneumococcal disease, but children under 53 years of age, people with certain medical conditions, adults over 64 years of age, and cigarette smokers are at the highest risk. About 18,000 older adults die each year from pneumococcal disease in the [place]. Treatment of pneumococcal infections with penicillin and other drugs used to be more effective. But some strains of the disease have become resistant to these drugs. This makes prevention of the disease, through vaccination, even more important. 2. Pneumococcal polysaccharide vaccine (PPSV23) Pneumococcal polysaccharide vaccine (PPSV23) protects against 23 types of pneumococcal bacteria. It will not prevent all pneumococcal disease. PPSV23 is recommended for:  All adults 54 years of age and older,  Anyone 2 through 58 years of age with certain long-term health problems,  Anyone 2 through 58 years of age with a weakened immune system,  Adults 60 through 58 years of age who smoke cigarettes or have asthma. Most people need only one dose of PPSV. A second dose is recommended for certain high-risk groups. People 93 and older should get a dose even if they have gotten one or more doses of the vaccine before they turned 65. Your healthcare provider can give you more information about these recommendations. Most healthy adults develop protection within 2  to 3 weeks of getting the shot. 3. Some people should not get this vaccine  Anyone who has had a life-threatening allergic reaction to PPSV should not get another dose.  Anyone who has a severe allergy to any component of PPSV should not receive it. Tell your provider if you have any severe allergies.  Anyone who is moderately or severely ill when the shot is scheduled may be asked to wait until they recover before getting the vaccine. Someone with a mild illness can usually be vaccinated.  Children less than 54 years of age should not receive this vaccine.  There is no evidence that PPSV is harmful to either a pregnant woman or to her fetus. However, as a precaution, women who need the vaccine should be vaccinated before becoming pregnant, if possible. 4. Risks of a vaccine reaction With any medicine, including vaccines, there is a chance of side effects. These are usually mild and go away on their own, but serious reactions are also possible. About half of people who get PPSV have mild side effects, such as redness or pain where the shot is given, which go away within about two days. Less than 1 out of 100 people develop a fever, muscle aches, or more severe local reactions. Problems that could happen after any vaccine:  People sometimes faint after a medical procedure, including vaccination. Sitting or lying down for about 15 minutes can help prevent fainting, and injuries caused by a fall. Tell your doctor if you feel dizzy, or have vision changes or ringing in the ears.  Some people get severe pain in the shoulder and have difficulty moving the arm where a shot  was given. This happens very rarely.  Any medication can cause a severe allergic reaction. Such reactions from a vaccine are very rare, estimated at about 1 in a million doses, and would happen within a few minutes to a few hours after the vaccination. As with any medicine, there is a very remote chance of a vaccine causing a  serious injury or death. The safety of vaccines is always being monitored. For more information, visit: http://floyd.org/ 5. What if there is a serious reaction? What should I look for? Look for anything that concerns you, such as signs of a severe allergic reaction, very high fever, or unusual behavior. Signs of a severe allergic reaction can include hives, swelling of the face and throat, difficulty breathing, a fast heartbeat, dizziness, and weakness. These would usually start a few minutes to a few hours after the vaccination. What should I do? If you think it is a severe allergic reaction or other emergency that can't wait, call 9-1-1 or get to the nearest hospital. Otherwise, call your doctor. Afterward, the reaction should be reported to the Vaccine Adverse Event Reporting System (VAERS). Your doctor might file this report, or you can do it yourself through the VAERS web site at www.vaers.LAgents.no, or by calling 1-309-042-2040. VAERS does not give medical advice. 6. How can I learn more?  Ask your doctor. He or she can give you the vaccine package insert or suggest other sources of information.  Call your local or state health department.  Contact the Centers for Disease Control and Prevention (CDC):  Call (313)332-7947 (1-800-CDC-INFO) or  Visit CDC's website at PicCapture.uy CDC Pneumococcal Polysaccharide Vaccine VIS (10/07/13) This information is not intended to replace advice given to you by your health care provider. Make sure you discuss any questions you have with your health care provider. Document Released: 03/30/2006 Document Revised: 02/21/2016 Document Reviewed: 02/21/2016 Elsevier Interactive Patient Education  2017 Else[employer] Inc[employer] Influenza Virus Vaccine injection (Fluarix) What is this medicine? INFLUENZA VIRUS VACCINE (in floo EN zuh VAHY ruhs vak SEEN) helps to reduce the risk of getting influenza also known as the flu. This medicine may be used for  other purposes; ask your health care provider or pharmacist if you have questions. COMMON BRAND NAME(S): Fluarix, Fluzone What should I tell my health care provider before I take this medicine? They need to know if you have any of these conditions: -bleeding disorder like hemophilia -fever or infection -Guillain-Barre syndrome or other neurological problems -immune system problems -infection with the human immunodeficiency virus (HIV) or AIDS -low blood platelet counts -multiple sclerosis -an unusual or allergic reaction to influenza virus vaccine, eggs, chicken proteins, latex, gentamicin, other medicines, foods, dyes or preservatives -pregnant or trying to get pregnant -breast-feeding How should I use this medicine? This vaccine is for injection into a muscle. It is given by a health care professional. A copy of Vaccine Information Statements will be given before each vaccination. Read this sheet carefully each time. The sheet may change frequently. Talk to your pediatrician regarding the use of this medicine in children. Special care may be needed. Overdosage: If you think you have taken too much of this medicine contact a poison control center or emergency room at once. NOTE: This medicine is only for you. Do not share this medicine with others. What if I miss a dose? This does not apply. What may interact with this medicine? -chemotherapy or radiation therapy -medicines that lower your immune system like etanercept, anakinra, infliximab, and adalimumab -medicines  that treat or prevent blood clots like warfarin -phenytoin -steroid medicines like prednisone or cortisone -theophylline -vaccines This list may not describe all possible interactions. Give your health care provider a list of all the medicines, herbs, non-prescription drugs, or dietary supplements you use. Also tell them if you smoke, drink alcohol, or use illegal drugs. Some items may interact with your medicine. What  should I watch for while using this medicine? Report any side effects that do not go away within 3 days to your doctor or health care professional. Call your health care provider if any unusual symptoms occur within 6 weeks of receiving this vaccine. You may still catch the flu, but the illness is not usually as bad. You cannot get the flu from the vaccine. The vaccine will not protect against colds or other illnesses that may cause fever. The vaccine is needed every year. What side effects may I notice from receiving this medicine? Side effects that you should report to your doctor or health care professional as soon as possible: -allergic reactions like skin rash, itching or hives, swelling of the face, lips, or tongue Side effects that usually do not require medical attention (report to your doctor or health care professional if they continue or are bothersome): -fever -headache -muscle aches and pains -pain, tenderness, redness, or swelling at site where injected -weak or tired This list may not describe all possible side effects. Call your doctor for medical advice about side effects. You may report side effects to FDA at 1-800-FDA-1088. Where should I keep my medicine? This vaccine is only given in a clinic, pharmacy, doctor's office, or other health care setting and will not be stored at home. NOTE: This sheet is a summary. It may not cover all possible information. If you have questions about this medicine, talk to your doctor, pharmacist, or health care provider.  2017 Elsevier/Gold Standard (2007-12-29 09:30:40) Td Vaccine (Tetanus and Diphtheria): What You Need to Know 1. Why get vaccinated? Tetanus  and diphtheria are very serious diseases. They are rare in the [place] today, but people who do become infected often have severe complications. Td vaccine is used to protect adolescents and adults from both of these diseases. Both tetanus and diphtheria are infections caused by  bacteria. Diphtheria spreads from person to person through coughing or sneezing. Tetanus-causing bacteria enter the body through cuts, scratches, or wounds. TETANUS (lockjaw) causes painful muscle tightening and stiffness, usually all over the body.  It can lead to tightening of muscles in the head and neck so you can't open your mouth, swallow, or sometimes even breathe. Tetanus kills about 1 out of every 10 people who are infected even after receiving the best medical care. DIPHTHERIA can cause a thick coating to form in the back of the throat.  It can lead to breathing problems, paralysis, heart failure, and death. Before vaccines, as many as 200,000 cases of diphtheria and hundreds of cases of tetanus were reported in the [place] each year. Since vaccination began, reports of cases for both diseases have dropped by about 99%. 2. Td vaccine Td vaccine can protect adolescents and adults from tetanus and diphtheria. Td is usually given as a booster dose every 10 years but it can also be given earlier after a severe and dirty wound or burn. Another vaccine, called Tdap, which protects against pertussis in addition to tetanus and diphtheria, is sometimes recommended instead of Td vaccine. Your doctor or the person giving you the vaccine can give  you more information. Td may safely be given at the same time as other vaccines. 3. Some people should not get this vaccine  A person who has ever had a life-threatening allergic reaction after a previous dose of any tetanus or diphtheria containing vaccine, OR has a severe allergy to any part of this vaccine, should not get Td vaccine. Tell the person giving the vaccine about any severe allergies.  Talk to your doctor if you:  had severe pain or swelling after any vaccine containing diphtheria or tetanus,  ever had a condition called Guillain Barre Syndrome (GBS),  aren't feeling well on the day the shot is scheduled. 4. What are the risks from  Td vaccine? With any medicine, including vaccines, there is a chance of side effects. These are usually mild and go away on their own. Serious reactions are also possible but are rare. Most people who get Td vaccine do not have any problems with it. Mild problems following Td vaccine: (Did not interfere with activities)  Pain where the shot was given (about 8 people in 10)  Redness or swelling where the shot was given (about 1 person in 4)  Mild fever (rare)  Headache (about 1 person in 4)  Tiredness (about 1 person in 4) Moderate problems following Td vaccine: (Interfered with activities, but did not require medical attention)  Fever over 102F (rare) Severe problems following Td vaccine: (Unable to perform usual activities; required medical attention)  Swelling, severe pain, bleeding and/or redness in the arm where the shot was given (rare). Problems that could happen after any vaccine:  People sometimes faint after a medical procedure, including vaccination. Sitting or lying down for about 15 minutes can help prevent fainting, and injuries caused by a fall. Tell your doctor if you feel dizzy, or have vision changes or ringing in the ears.  Some people get severe pain in the shoulder and have difficulty moving the arm where a shot was given. This happens very rarely.  Any medication can cause a severe allergic reaction. Such reactions from a vaccine are very rare, estimated at fewer than 1 in a million doses, and would happen within a few minutes to a few hours after the vaccination. As with any medicine, there is a very remote chance of a vaccine causing a serious injury or death. The safety of vaccines is always being monitored. For more information, visit: http://floyd.org/ 5. What if there is a serious reaction? What should I look for? Look for anything that concerns you, such as signs of a severe allergic reaction, very high fever, or unusual behavior. Signs of a  severe allergic reaction can include hives, swelling of the face and throat, difficulty breathing, a fast heartbeat, dizziness, and weakness. These would usually start a few minutes to a few hours after the vaccination. What should I do?  If you think it is a severe allergic reaction or other emergency that can't wait, call 9-1-1 or get the person to the nearest hospital. Otherwise, call your doctor.  Afterward, the reaction should be reported to the Vaccine Adverse Event Reporting System (VAERS). Your doctor might file this report, or you can do it yourself through the VAERS web site at www.vaers.LAgents.no, or by calling 1-309 691 0764.  VAERS does not give medical advice. 6. The National Vaccine Injury Compensation Program The [employer] Vaccine Injury Compensation Program (VICP) is a federal program that was created to compensate people who may have been injured by certain vaccines. Persons who believe they may  have been injured by a vaccine can learn about the program and about filing a claim by calling 1-6611981282 or visiting the VICP website at SpiritualWord.at. There is a time limit to file a claim for compensation. 7. How can I learn more?  Ask your doctor. He or she can give you the vaccine package insert or suggest other sources of information.  Call your local or state health department.  Contact the Centers for Disease Control and Prevention (CDC):  Call 214-208-5645 (1-800-CDC-INFO)  Visit CDC's website at PicCapture.uy CDC Td Vaccine VIS (09/25/15) This information is not intended to replace advice given to you by your health care provider. Make sure you discuss any questions you have with your health care provider. Document Released: 03/30/2006 Document Revised: 02/21/2016 Document Reviewed: 02/21/2016 Elsevier Interactive Patient Education  2017 Elsevier Inc.  -  Diabetes Mellitus and Food It is important for you to manage your blood sugar  (glucose) level. Your blood glucose level can be greatly affected by what you eat. Eating healthier foods in the appropriate amounts throughout the day at about the same time each day will help you control your blood glucose level. It can also help slow or prevent worsening of your diabetes mellitus. Healthy eating may even help you improve the level of your blood pressure and reach or maintain a healthy weight. General recommendations for healthful eating and cooking habits include:  Eating meals and snacks regularly. Avoid going long periods of time without eating to lose weight.  Eating a diet that consists mainly of plant-based foods, such as fruits, vegetables, nuts, legumes, and whole grains.  Using low-heat cooking methods, such as baking, instead of high-heat cooking methods, such as deep frying. Work with your dietitian to make sure you understand how to use the Nutrition Facts information on food labels. How can food affect me? Carbohydrates  Carbohydrates affect your blood glucose level more than any other type of food. Your dietitian will help you determine how many carbohydrates to eat at each meal and teach you how to count carbohydrates. Counting carbohydrates is important to keep your blood glucose at a healthy level, especially if you are using insulin or taking certain medicines for diabetes mellitus. Alcohol  Alcohol can cause sudden decreases in blood glucose (hypoglycemia), especially if you use insulin or take certain medicines for diabetes mellitus. Hypoglycemia can be a life-threatening condition. Symptoms of hypoglycemia (sleepiness, dizziness, and disorientation) are similar to symptoms of having too much alcohol. If your health care provider has given you approval to drink alcohol, do so in moderation and use the following guidelines:  Women should not have more than one drink per day, and men should not have more than two drinks per day. One drink is equal to:  12 oz of  beer.  5 oz of wine.  1 oz of hard liquor.  Do not drink on an empty stomach.  Keep yourself hydrated. Have water, diet soda, or unsweetened iced tea.  Regular soda, juice, and other mixers might contain a lot of carbohydrates and should be counted. What foods are not recommended? As you make food choices, it is important to remember that all foods are not the same. Some foods have fewer nutrients per serving than other foods, even though they might have the same number of calories or carbohydrates. It is difficult to get your body what it needs when you eat foods with fewer nutrients. Examples of foods that you should avoid that are high in calories and  carbohydrates but low in nutrients include:  Trans fats (most processed foods list trans fats on the Nutrition Facts label).  Regular soda.  Juice.  Candy.  Sweets, such as cake, pie, doughnuts, and cookies.  Fried foods. What foods can I eat? Eat nutrient-rich foods, which will nourish your body and keep you healthy. The food you should eat also will depend on several factors, including:  The calories you need.  The medicines you take.  Your weight.  Your blood glucose level.  Your blood pressure level.  Your cholesterol level. You should eat a variety of foods, including:  Protein.  Lean cuts of meat.  Proteins low in saturated fats, such as fish, egg whites, and beans. Avoid processed meats.  Fruits and vegetables.  Fruits and vegetables that may help control blood glucose levels, such as apples, mangoes, and yams.  Dairy products.  Choose fat-free or low-fat dairy products, such as milk, yogurt, and cheese.  Grains, bread, pasta, and rice.  Choose whole grain products, such as multigrain bread, whole oats, and brown rice. These foods may help control blood pressure.  Fats.  Foods containing healthful fats, such as nuts, avocado, olive oil, canola oil, and fish. Does everyone with diabetes mellitus  have the same meal plan? Because every person with diabetes mellitus is different, there is not one meal plan that works for everyone. It is very important that you meet with a dietitian who will help you create a meal plan that is just right for you. This information is not intended to replace advice given to you by your health care provider. Make sure you discuss any questions you have with your health care provider. Document Released: 02/27/2005 Document Revised: 11/08/2015 Document Reviewed: 04/29/2013 Elsevier Interactive Patient Education  2017 Elsevier Inc.   - Insomnia Insomnia is a sleep disorder that makes it difficult to fall asleep or to stay asleep. Insomnia can cause tiredness (fatigue), low energy, difficulty concentrating, mood swings, and poor performance at work or school. There are three different ways to classify insomnia:  Difficulty falling asleep.  Difficulty staying asleep.  Waking up too early in the morning. Any type of insomnia can be long-term (chronic) or short-term (acute). Both are common. Short-term insomnia usually lasts for three months or less. Chronic insomnia occurs at least three times a week for longer than three months. What are the causes? Insomnia may be caused by another condition, situation, or substance, such as:  Anxiety.  Certain medicines.  Gastroesophageal reflux disease (GERD) or other gastrointestinal conditions.  Asthma or other breathing conditions.  Restless legs syndrome, sleep apnea, or other sleep disorders.  Chronic pain.  Menopause. This may include hot flashes.  Stroke.  Abuse of alcohol, tobacco, or illegal drugs.  Depression.  Caffeine.  Neurological disorders, such as Alzheimer disease.  An overactive thyroid (hyperthyroidism). The cause of insomnia may not be known. What increases the risk? Risk factors for insomnia include:  Gender. Women are more commonly affected than men.  Age. Insomnia is more  common as you get older.  Stress. This may involve your professional or personal life.  Income. Insomnia is more common in people with lower income.  Lack of exercise.  Irregular work schedule or night shifts.  Traveling between different time zones. What are the signs or symptoms? If you have insomnia, trouble falling asleep or trouble staying asleep is the main symptom. This may lead to other symptoms, such as:  Feeling fatigued.  Feeling nervous about going to  sleep.  Not feeling rested in the morning.  Having trouble concentrating.  Feeling irritable, anxious, or depressed. How is this treated? Treatment for insomnia depends on the cause. If your insomnia is caused by an underlying condition, treatment will focus on addressing the condition. Treatment may also include:  Medicines to help you sleep.  Counseling or therapy.  Lifestyle adjustments. Follow these instructions at home:  Take medicines only as directed by your health care provider.  Keep regular sleeping and waking hours. Avoid naps.  Keep a sleep diary to help you and your health care provider figure out what could be causing your insomnia. Include:  When you sleep.  When you wake up during the night.  How well you sleep.  How rested you feel the next day.  Any side effects of medicines you are taking.  What you eat and drink.  Make your bedroom a comfortable place where it is easy to fall asleep:  Put up shades or special blackout curtains to block light from outside.  Use a white noise machine to block noise.  Keep the temperature cool.  Exercise regularly as directed by your health care provider. Avoid exercising right before bedtime.  Use relaxation techniques to manage stress. Ask your health care provider to suggest some techniques that may work well for you. These may include:  Breathing exercises.  Routines to release muscle tension.  Visualizing peaceful scenes.  Cut back on  alcohol, caffeinated beverages, and cigarettes, especially close to bedtime. These can disrupt your sleep.  Do not overeat or eat spicy foods right before bedtime. This can lead to digestive discomfort that can make it hard for you to sleep.  Limit screen use before bedtime. This includes:  Watching TV.  Using your smartphone, tablet, and computer.  Stick to a routine. This can help you fall asleep faster. Try to do a quiet activity, brush your teeth, and go to bed at the same time each night.  Get out of bed if you are still awake after 15 minutes of trying to sleep. Keep the lights down, but try reading or doing a quiet activity. When you feel sleepy, go back to bed.  Make sure that you drive carefully. Avoid driving if you feel very sleepy.  Keep all follow-up appointments as directed by your health care provider. This is important. Contact a health care provider if:  You are tired throughout the day or have trouble in your daily routine due to sleepiness.  You continue to have sleep problems or your sleep problems get worse. Get help right away if:  You have serious thoughts about hurting yourself or someone else. This information is not intended to replace advice given to you by your health care provider. Make sure you discuss any questions you have with your health care provider. Document Released: 05/30/2000 Document Revised: 11/02/2015 Document Reviewed: 03/03/2014 Elsevier Interactive Patient Education  2017 [employer].

## 2016-06-17 NOTE — Progress Notes (Signed)
Meredith Ryan, is a 56 y.o. female  ZOX:[id number]  [id number]  DOB - September 09, 1960  Chief Complaint  Patient presents with  . Annual Exam        Subjective:   Meredith Planisha Filter is a 56 y.o. female here today for a follow up visit., last seen 4/17, w/ hx of dm and htn. Here w/ her pt.  Grown dgt here w/ pt as well. Per dgt, she has noticed her mom has lost >20lbs in the last year.  Her mom has not appetite, not eating much at all, sleeps 4-5 hrs a day, and c/o of being tired all the time.  Of note, pt denies c/o of depression or dysthymia. She states overall she feels very well, and not sad/tearful at all.  Taking all her meds as prescribed, also taking calcium and vit d supplements, and daily mvi. Denies tob or etoh.  Patient has No headache, No chest pain, No abdominal pain - No Nausea, No new weakness tingling or numbness, No Cough - SOB.  No problems updated.  ALLERGIES: No Known Allergies  PAST MEDICAL HISTORY: Past Medical History:  Diagnosis Date  . Anemia   . DM type 2 (diabetes mellitus, type 2) (HCC)   . HLD (hyperlipidemia)     MEDICATIONS AT HOME: Prior to Admission medications   Medication Sig Start Date End Date Taking? Authorizing Provider  aspirin 81 MG tablet Take 1 tablet (81 mg total) by mouth daily. 06/17/16  Yes Pete Glatterawn T Willy Vorce, MD  metFORMIN (GLUCOPHAGE) 500 MG tablet Take 1 tablet (500 mg total) by mouth daily with breakfast. 06/17/16  Yes Pete Glatterawn T Yetzali Weld, MD  Multiple Vitamins-Minerals (MULTIVITAMIN WITH MINERALS) tablet Take 1 tablet by mouth daily. 05/19/14  Yes Doris Cheadleeepak Advani, MD  traZODone (DESYREL) 50 MG tablet Take 0.5-1 tablets (25-50 mg total) by mouth at bedtime as needed for sleep. 10/12/12  Yes Amber Nydia BoutonM Hairford, MD  Diclofenac-Misoprostol (ARTHROTEC) 75-0.2 MG TBEC Take by mouth. Reported on 09/17/2015    Historical Provider, MD  ferrous sulfate 325 (65 FE) MG tablet TAKE ONE TABLET BY MOUTH ONCE DAILY WITH  BREAKFAST Patient not taking:  Reported on 06/17/2016 06/16/13   Hilarie FredricksonAmber M Hairford, MD  fluticasone (FLONASE) 50 MCG/ACT nasal spray Place 2 sprays into the nose daily. Patient not taking: Reported on 06/17/2016 10/12/12   Hilarie FredricksonAmber M Hairford, MD  Melatonin 5 MG CAPS Take 1 capsule (5 mg total) by mouth at bedtime. 06/17/16   Pete Glatterawn T Shateria Paternostro, MD  nystatin cream (MYCOSTATIN) APPLY TOPICALLY TWO TIMES A DAY Patient not taking: Reported on 06/17/2016 06/07/12   Hilarie FredricksonAmber M Hairford, MD  omeprazole (PRILOSEC) 20 MG capsule Take 1 capsule (20 mg total) by mouth daily. Patient not taking: Reported on 06/17/2016 02/14/16   Pete Glatterawn T Nishawn Rotan, MD  pravastatin (PRAVACHOL) 20 MG tablet Take 1 tablet (20 mg total) by mouth daily. 06/17/16   Pete Glatterawn T Wissam Resor, MD     Objective:   Vitals:   06/17/16 1520  BP: 117/78  Pulse: 88  Resp: 16  Temp: 97.7 F (36.5 C)  TempSrc: Oral  SpO2: 98%  Weight: 147 lb (66.7 kg)   Pt was 145lbs 10/01/15.  Exam General appearance : Awake, alert, not in any distress. Speech Clear. Not toxic looking, pleasant. Pt is same weight when I saw her on 4/17 HEENT: Atraumatic and Normocephalic, pupils equally reactive to light. bilat Tms clear. Neck: supple, no JVD. No cervical lymphadenopathy.  Chest:Good air entry bilaterally, no added sounds. CVS:  S1 S2 regular, no murmurs/gallups or rubs. Abdomen: Bowel sounds active, obese, Non tender and not distended with no gaurding, rigidity or rebound. Extremities: B/L Lower Ext shows no edema, both legs are warm to touch Neurology: Awake alert, and oriented X 3, CN II-XII grossly intact, Non focal Skin:No Rash  Data Review Lab Results  Component Value Date   HGBA1C 6.8 06/17/2016   HGBA1C 6.8 09/17/2015   HGBA1C 6.40 01/09/2015    Depression screen PHQ 2/9 06/17/2016 09/17/2015  Decreased Interest 3 0  Down, Depressed, Hopeless 0 0  PHQ - 2 Score 3 0  Altered sleeping 3 -  Tired, decreased energy 3 -  Change in appetite 3 -  Feeling bad or failure about yourself  0 -    Trouble concentrating 0 -  Moving slowly or fidgety/restless 0 -  Suicidal thoughts 0 -  PHQ-9 Score 12 -      Assessment & Plan   1. Diabetes mellitus type 2 in nonobese (HCC) No weight loss on our VS, but will decrease metformin to qd due to concerns of anorexia. - POCT glucose (manual entry) - POCT glycosylated hemoglobin (Hb A1C) 6.8 - unchanged - CMP and Liver - CBC with Differential - Microalbumin/Creatinine Ratio, Urine - pt has not been taking glyburide for long time.  2. Vitamin D deficiency, hx of, currently on supplements OTC - VITAMIN D 25 Hydroxy (Vit-D Deficiency, Fractures)  3. Loss of weight Pt actually gained 2 lbs since I last saw in April 2017, but will chk the following. - TSH - T4, Free - Prealbumin - glucerna 1 month supply provided for free as well.  4. Need for hepatitis C screening test - Hepatitis C antibody  5. Encounter for screening for HIV - HIV antibody (with reflex)  6. Encounter for immunization - Flu Vaccine QUAD 36+ mos IM - tdap. booster - pt also accidentally already received the pneumococcal 23 v as well today, last injected 2007   Patient have been counseled extensively about nutrition and exercise  Return in about 3 months (around 09/15/2016).  The patient was given clear instructions to go to ER or return to medical center if symptoms don't improve, worsen or new problems develop. The patient verbalized understanding. The patient was told to call to get lab results if they haven't heard anything in the next week.   This note has been created with Education officer, environmental. Any transcriptional errors are unintentional.   Pete Glatter, MD, MBA/MHA [facility] [place], [place] [phone]   06/17/2016, 5:07 PM

## 2016-06-18 LAB — T4, FREE: Free T4: 1.5 ng/dL (ref 0.8–1.8)

## 2016-06-18 LAB — HIV ANTIBODY (ROUTINE TESTING W REFLEX): HIV: NONREACTIVE

## 2016-06-18 LAB — CMP AND LIVER
ALK PHOS: 62 U/L (ref 33–130)
ALT: 13 U/L (ref 6–29)
AST: 18 U/L (ref 10–35)
Albumin: 4.5 g/dL (ref 3.6–5.1)
BILIRUBIN DIRECT: 0.1 mg/dL (ref <=0.2)
BILIRUBIN INDIRECT: 0.2 mg/dL (ref 0.2–1.2)
BUN: 13 mg/dL (ref 7–25)
CO2: 26 mmol/L (ref 20–31)
Calcium: 9.5 mg/dL (ref 8.6–10.4)
Chloride: 98 mmol/L (ref 98–110)
Creat: 0.7 mg/dL (ref 0.50–1.05)
Glucose, Bld: 67 mg/dL (ref 65–99)
POTASSIUM: 4.6 mmol/L (ref 3.5–5.3)
SODIUM: 137 mmol/L (ref 135–146)
TOTAL PROTEIN: 7.7 g/dL (ref 6.1–8.1)
Total Bilirubin: 0.3 mg/dL (ref 0.2–1.2)

## 2016-06-18 LAB — HEPATITIS C ANTIBODY: HCV AB: NEGATIVE

## 2016-06-18 LAB — VITAMIN D 25 HYDROXY (VIT D DEFICIENCY, FRACTURES): VIT D 25 HYDROXY: 41 ng/mL (ref 30–100)

## 2016-06-18 LAB — PREALBUMIN: PREALBUMIN: 37 mg/dL — AB (ref 17–34)

## 2016-06-18 LAB — TSH: TSH: 2.38 m[IU]/L

## 2016-06-25 ENCOUNTER — Telehealth: Payer: Self-pay

## 2016-06-25 NOTE — Telephone Encounter (Signed)
Contacted pt to go over lab results spoke with pt husband and is aware of results and will let patient know

## 2016-06-30 ENCOUNTER — Other Ambulatory Visit: Payer: Self-pay | Admitting: Internal Medicine

## 2016-07-01 ENCOUNTER — Other Ambulatory Visit: Payer: Self-pay | Admitting: Internal Medicine

## 2016-07-08 ENCOUNTER — Telehealth: Payer: Self-pay | Admitting: Internal Medicine

## 2016-07-08 ENCOUNTER — Other Ambulatory Visit: Payer: Self-pay | Admitting: Internal Medicine

## 2016-07-08 NOTE — Telephone Encounter (Signed)
Patient's husband came in stating that he is unable to get the refill for glyBURIDE (DIABETA) 2.5 MG tablet For his wife. Looking in notes, it looks like the med is discontinued. Please f/u.

## 2016-07-09 NOTE — Telephone Encounter (Signed)
Contacted pt husband and made aware that pt is not on glyburide pt is on metformin and if she needs refills they will need to contact the pharmacy and it takes the pharmacy 48-72 hours for refill request. Pt husband states he understands and doesn't have any questions or concerns

## 2016-07-18 MED FILL — ?METFORMIN HCL 500MG TABLET: 500 | 30 days supply | Qty: 60 | Fill #8

## 2016-07-18 MED FILL — PRAVASTATIN NA 20 MG TAB: 20 | 30 days supply | Qty: 30 | Fill #1

## 2016-07-21 ENCOUNTER — Ambulatory Visit: Payer: Self-pay | Attending: Physician Assistant | Admitting: Physician Assistant

## 2016-07-21 ENCOUNTER — Encounter: Payer: Self-pay | Admitting: Physician Assistant

## 2016-07-21 VITALS — BP 138/80 | HR 100 | Temp 98.4°F | Resp 20 | Wt 147.8 lb

## 2016-07-21 DIAGNOSIS — D649 Anemia, unspecified: Secondary | ICD-10-CM | POA: Insufficient documentation

## 2016-07-21 DIAGNOSIS — Z7984 Long term (current) use of oral hypoglycemic drugs: Secondary | ICD-10-CM | POA: Insufficient documentation

## 2016-07-21 DIAGNOSIS — R42 Dizziness and giddiness: Secondary | ICD-10-CM | POA: Insufficient documentation

## 2016-07-21 DIAGNOSIS — G4700 Insomnia, unspecified: Secondary | ICD-10-CM | POA: Insufficient documentation

## 2016-07-21 DIAGNOSIS — I951 Orthostatic hypotension: Secondary | ICD-10-CM | POA: Insufficient documentation

## 2016-07-21 DIAGNOSIS — E119 Type 2 diabetes mellitus without complications: Secondary | ICD-10-CM | POA: Insufficient documentation

## 2016-07-21 DIAGNOSIS — E785 Hyperlipidemia, unspecified: Secondary | ICD-10-CM | POA: Insufficient documentation

## 2016-07-21 LAB — COMPREHENSIVE METABOLIC PANEL
ALK PHOS: 61 U/L (ref 33–130)
ALT: 12 U/L (ref 6–29)
AST: 18 U/L (ref 10–35)
Albumin: 4.3 g/dL (ref 3.6–5.1)
BUN: 18 mg/dL (ref 7–25)
CO2: 30 mmol/L (ref 20–31)
Calcium: 9.6 mg/dL (ref 8.6–10.4)
Chloride: 100 mmol/L (ref 98–110)
Creat: 0.77 mg/dL (ref 0.50–1.05)
GLUCOSE: 113 mg/dL — AB (ref 65–99)
POTASSIUM: 4.6 mmol/L (ref 3.5–5.3)
Sodium: 137 mmol/L (ref 135–146)
Total Bilirubin: 0.3 mg/dL (ref 0.2–1.2)
Total Protein: 7.7 g/dL (ref 6.1–8.1)

## 2016-07-21 LAB — CBC WITH DIFFERENTIAL/PLATELET
BASOS PCT: 0 %
Basophils Absolute: 0 cells/uL (ref 0–200)
Eosinophils Absolute: 222 cells/uL (ref 15–500)
Eosinophils Relative: 3 %
HCT: 40.5 % (ref 35.0–45.0)
Hemoglobin: 12.9 g/dL (ref 11.7–15.5)
Lymphocytes Relative: 42 %
Lymphs Abs: 3108 cells/uL (ref 850–3900)
MCH: 24.5 pg — ABNORMAL LOW (ref 27.0–33.0)
MCHC: 31.9 g/dL — ABNORMAL LOW (ref 32.0–36.0)
MCV: 76.9 fL — AB (ref 80.0–100.0)
MPV: 9.2 fL (ref 7.5–12.5)
Monocytes Absolute: 518 cells/uL (ref 200–950)
Monocytes Relative: 7 %
Neutro Abs: 3552 cells/uL (ref 1500–7800)
Neutrophils Relative %: 48 %
PLATELETS: 356 10*3/uL (ref 140–400)
RBC: 5.27 MIL/uL — AB (ref 3.80–5.10)
RDW: 15.3 % — AB (ref 11.0–15.0)
WBC: 7.4 10*3/uL (ref 3.8–10.8)

## 2016-07-21 LAB — TSH: TSH: 1.75 mIU/L

## 2016-07-21 MED ORDER — TRAZODONE HCL 50 MG PO TABS: 50.0000 mg | ORAL_TABLET | Freq: Every day | ORAL | 0 refills | Status: DC

## 2016-07-21 NOTE — Patient Instructions (Addendum)
We will await laboratory testing and call with the results. We will also consider tilt table testing should your symptoms persist or worsen. Please make sure you keep hydrated. 8-10 cups of water per day should be sufficient.  Orthostatic Hypotension Orthostatic hypotension is a sudden drop in blood pressure that happens when you quickly change positions, such as when you get up from a seated or lying position. Blood pressure is a measurement of how strongly, or weakly, your blood is pressing against the walls of your arteries. Arteries are blood vessels that carry blood from your heart throughout your body. When blood pressure is too low, you may not get enough blood to your brain or to the rest of your organs. This can cause weakness, light-headedness, rapid heartbeat, and fainting. This can last for just a few seconds or for up to a few minutes. Orthostatic hypotension is usually not a serious problem. However, if it happens frequently or gets worse, it may be a sign of something more serious. What are the causes? This condition may be caused by:  Sudden changes in posture, such as standing up quickly after you have been sitting or lying down.  Blood loss.  Loss of body fluids (dehydration).  Heart problems.  Hormone (endocrine) problems.  Pregnancy.  Severe infection.  Lack of certain nutrients.  Severe allergic reactions (anaphylaxis).  Certain medicines, such as blood pressure medicine or medicines that make the body lose excess fluids (diuretics). Sometimes, this condition can be caused by not taking medicine as directed, such as taking too much of a certain medicine. What increases the risk? Certain factors can make you more likely to develop orthostatic hypotension, including:  Age. Risk increases as you get older.  Conditions that affect the heart or the central nervous system.  Taking certain medicines, such as blood pressure medicine or diuretics.  Being pregnant. What  are the signs or symptoms? Symptoms of this condition may include:  Weakness.  Light-headedness.  Dizziness.  Blurred vision.  Fatigue.  Rapid heartbeat.  Fainting, in severe cases. How is this diagnosed? This condition is diagnosed based on:  Your medical history.  Your symptoms.  Your blood pressure measurement. Your health care provider will check your blood pressure when you are:  Lying down.  Sitting.  Standing. A blood pressure reading is recorded as two numbers, such as "120 over 80" (or 120/80). The first ("top") number is called the systolic pressure. It is a measure of the pressure in your arteries as your heart beats. The second ("bottom") number is called the diastolic pressure. It is a measure of the pressure in your arteries when your heart relaxes between beats. Blood pressure is measured in a unit called mm Hg. Healthy blood pressure for adults is 120/80. If your blood pressure is below 90/60, you may be diagnosed with hypotension. Other information or tests that may be used to diagnose orthostatic hypotension include:  Your other vital signs, such as your heart rate and temperature.  Blood tests.  Tilt table test. For this test, you will be safely secured to a table that moves you from a lying position to an upright position. Your heart rhythm and blood pressure will be monitored during the test. How is this treated? Treatment for this condition may include:  Changing your diet. This may involve eating more salt (sodium) or drinking more water.  Taking medicines to raise your blood pressure.  Changing the dosage of certain medicines you are taking that might be lowering  your blood pressure.  Wearing compression stockings. These stockings help to prevent blood clots and reduce swelling in your legs. In some cases, you may need to go to the hospital for:  Fluid replacement. This means you will receive fluids through an IV tube.  Blood replacement.  This means you will receive donated blood through an IV tube (transfusion).  Treating an infection or heart problems, if this applies.  Monitoring. You may need to be monitored while medicines that you are taking wear off. Follow these instructions at home: Eating and drinking  Drink enough fluid to keep your urine clear or pale yellow.  Eat a healthy diet and follow instructions from your health care provider about eating or drinking restrictions. A healthy diet includes:  Fresh fruits and vegetables.  Whole grains.  Lean meats.  Low-fat dairy products.  Eat extra salt only as directed. Do not add extra salt to your diet unless your health care provider told you to do that.  Eat frequent, small meals.  Avoid standing up suddenly after eating. Medicines  Take over-the-counter and prescription medicines only as told by your health care provider.  Follow instructions from your health care provider about changing the dosage of your current medicines, if this applies.  Do not stop or adjust any of your medicines on your own. General instructions  Wear compression stockings as told by your health care provider.  Get up slowly from lying down or sitting positions. This gives your blood pressure a chance to adjust.  Avoid hot showers and excessive heat as directed by your health care provider.  Return to your normal activities as told by your health care provider. Ask your health care provider what activities are safe for you.  Do not use any products that contain nicotine or tobacco, such as cigarettes and e-cigarettes. If you need help quitting, ask your health care provider.  Keep all follow-up visits as told by your health care provider. This is important. Contact a health care provider if:  You vomit.  You have diarrhea.  You have a fever for more than 2-3 days.  You feel more thirsty than usual.  You feel weak and tired. Get help right away if:  You have chest  pain.  You have a fast or irregular heartbeat.  You develop numbness in any part of your body.  You cannot move your arms or your legs.  You have trouble speaking.  You become sweaty or feel lightheaded.  You faint.  You feel short of breath.  You have trouble staying awake.  You feel confused. This information is not intended to replace advice given to you by your health care provider. Make sure you discuss any questions you have with your health care provider. Document Released: 05/23/2002 Document Revised: 02/19/2016 Document Reviewed: 11/23/2015 Elsevier Interactive Patient Education  2017 [employer] Insomnia Insomnia is a sleep disorder that makes it difficult to fall asleep or to stay asleep. Insomnia can cause tiredness (fatigue), low energy, difficulty concentrating, mood swings, and poor performance at work or school. There are three different ways to classify insomnia:  Difficulty falling asleep.  Difficulty staying asleep.  Waking up too early in the morning. Any type of insomnia can be long-term (chronic) or short-term (acute). Both are common. Short-term insomnia usually lasts for three months or less. Chronic insomnia occurs at least three times a week for longer than three months. What are the causes? Insomnia may be caused by another condition, situation, or substance,  such as:  Anxiety.  Certain medicines.  Gastroesophageal reflux disease (GERD) or other gastrointestinal conditions.  Asthma or other breathing conditions.  Restless legs syndrome, sleep apnea, or other sleep disorders.  Chronic pain.  Menopause. This may include hot flashes.  Stroke.  Abuse of alcohol, tobacco, or illegal drugs.  Depression.  Caffeine.  Neurological disorders, such as Alzheimer disease.  An overactive thyroid (hyperthyroidism). The cause of insomnia may not be known. What increases the risk? Risk factors for insomnia include:  Gender. Women are more  commonly affected than men.  Age. Insomnia is more common as you get older.  Stress. This may involve your professional or personal life.  Income. Insomnia is more common in people with lower income.  Lack of exercise.  Irregular work schedule or night shifts.  Traveling between different time zones. What are the signs or symptoms? If you have insomnia, trouble falling asleep or trouble staying asleep is the main symptom. This may lead to other symptoms, such as:  Feeling fatigued.  Feeling nervous about going to sleep.  Not feeling rested in the morning.  Having trouble concentrating.  Feeling irritable, anxious, or depressed. How is this treated? Treatment for insomnia depends on the cause. If your insomnia is caused by an underlying condition, treatment will focus on addressing the condition. Treatment may also include:  Medicines to help you sleep.  Counseling or therapy.  Lifestyle adjustments. Follow these instructions at home:  Take medicines only as directed by your health care provider.  Keep regular sleeping and waking hours. Avoid naps.  Keep a sleep diary to help you and your health care provider figure out what could be causing your insomnia. Include:  When you sleep.  When you wake up during the night.  How well you sleep.  How rested you feel the next day.  Any side effects of medicines you are taking.  What you eat and drink.  Make your bedroom a comfortable place where it is easy to fall asleep:  Put up shades or special blackout curtains to block light from outside.  Use a white noise machine to block noise.  Keep the temperature cool.  Exercise regularly as directed by your health care provider. Avoid exercising right before bedtime.  Use relaxation techniques to manage stress. Ask your health care provider to suggest some techniques that may work well for you. These may include:  Breathing exercises.  Routines to release muscle  tension.  Visualizing peaceful scenes.  Cut back on alcohol, caffeinated beverages, and cigarettes, especially close to bedtime. These can disrupt your sleep.  Do not overeat or eat spicy foods right before bedtime. This can lead to digestive discomfort that can make it hard for you to sleep.  Limit screen use before bedtime. This includes:  Watching TV.  Using your smartphone, tablet, and computer.  Stick to a routine. This can help you fall asleep faster. Try to do a quiet activity, brush your teeth, and go to bed at the same time each night.  Get out of bed if you are still awake after 15 minutes of trying to sleep. Keep the lights down, but try reading or doing a quiet activity. When you feel sleepy, go back to bed.  Make sure that you drive carefully. Avoid driving if you feel very sleepy.  Keep all follow-up appointments as directed by your health care provider. This is important. Contact a health care provider if:  You are tired throughout the day or have trouble  in your daily routine due to sleepiness.  You continue to have sleep problems or your sleep problems get worse. Get help right away if:  You have serious thoughts about hurting yourself or someone else. This information is not intended to replace advice given to you by your health care provider. Make sure you discuss any questions you have with your health care provider. Document Released: 05/30/2000 Document Revised: 11/02/2015 Document Reviewed: 03/03/2014 Elsevier Interactive Patient Education  2017 [employer].

## 2016-07-21 NOTE — Progress Notes (Signed)
Problems with seeing, needs new prescription Larey SeatFell two days ago, denies injury.

## 2016-07-21 NOTE — Progress Notes (Signed)
Patient ID: Meredith Ryan, female   DOB: 1960/09/06, 56 y.o.   MRN: [id number]    Subjective:  Patient ID: Meredith Meredith Ryan, female    DOB: 1960/09/06  Age: 56 y.o. MRN: [id number]  CC: Dizziness  HPI Meredith Meredith Ryan is a 56 y.o. female with a PMH of   Past Medical History:  Diagnosis Date  . Anemia   . DM type 2 (diabetes mellitus, type 2) (HCC)   . HLD (hyperlipidemia)     that presents with a 2 day history of "dizziness". First felt dizzy when at work and subsequently went home took her blood glucose reading which resulted in 340s. This is the first episode of this type she has experienced. She did not fall or lose consciousness. There were no preceding signs or symptoms. Patient states she felt dizzy when she stood from a seated position. The dizziness lasted momentarily and was not associated with any other symptoms. States that she has adequate intake of water throughout the workday. However, she does admit that she does not eat well. She denies recent illness, chest pain, palpitations, shortness of breath, cough, headache, visual disturbances, abdominal pain, nausea, vomiting, fever, chills, rash, or GI/GU symptoms.    Outpatient Medications Prior to Visit  Medication Sig Dispense Refill  . aspirin 81 MG tablet Take 1 tablet (81 mg total) by mouth daily. 90 tablet 3  . metFORMIN (GLUCOPHAGE) 500 MG tablet Take 1 tablet (500 mg total) by mouth daily with breakfast. 180 tablet 3  . Multiple Vitamins-Minerals (MULTIVITAMIN WITH MINERALS) tablet Take 1 tablet by mouth daily. 30 tablet 2  . pravastatin (PRAVACHOL) 20 MG tablet Take 1 tablet (20 mg total) by mouth daily. 90 tablet 3  . Diclofenac-Misoprostol (ARTHROTEC) 75-0.2 MG TBEC Take by mouth. Reported on 09/17/2015    . ferrous sulfate 325 (65 FE) MG tablet TAKE ONE TABLET BY MOUTH ONCE DAILY WITH  BREAKFAST (Patient not taking: Reported on 06/17/2016) 30 tablet 11  . fluticasone (FLONASE) 50 MCG/ACT nasal spray Place 2 sprays into the  nose daily. (Patient not taking: Reported on 06/17/2016) 16 g 6  . Melatonin 5 MG CAPS Take 1 capsule (5 mg total) by mouth at bedtime. (Patient not taking: Reported on 07/21/2016) 90 capsule 3  . nystatin cream (MYCOSTATIN) APPLY TOPICALLY TWO TIMES A DAY (Patient not taking: Reported on 06/17/2016) 30 g 0  . omeprazole (PRILOSEC) 20 MG capsule Take 1 capsule (20 mg total) by mouth daily. (Patient not taking: Reported on 06/17/2016) 30 capsule 0  . traZODone (DESYREL) 50 MG tablet Take 0.5-1 tablets (25-50 mg total) by mouth at bedtime as needed for sleep. (Patient not taking: Reported on 07/21/2016) 30 tablet 3   No facility-administered medications prior to visit.      ROS Review of Systems  Constitutional: Negative for chills, fever and malaise/fatigue.  HENT: Negative for ear pain.   Eyes: Negative for blurred vision.  Respiratory: Negative for cough and shortness of breath.   Cardiovascular: Negative for chest pain, palpitations and leg swelling.  Gastrointestinal: Negative for abdominal pain and nausea.  Genitourinary: Negative for dysuria and hematuria.  Musculoskeletal: Negative for joint pain and myalgias.  Skin: Negative for rash.  Neurological: Negative for tingling and headaches.  Psychiatric/Behavioral: Negative for depression. The patient is not nervous/anxious.     Objective:  BP 138/80 (BP Location: Right Arm, Patient Position: Sitting, Cuff Size: Normal)   Pulse 100   Temp 98.4 F (36.9 C) (Oral)   Resp 20  Wt 147 lb 12.8 oz (67 kg)   SpO2 100%   BMI 25.37 kg/m   BP/Weight 07/21/2016 06/17/2016 10/01/2015  Systolic BP 138 117 113  Diastolic BP 80 78 76  Wt. (Lbs) 147.8 147 145  BMI 25.37 25.23 24.88      Physical Exam  Constitutional: She is oriented to person, place, and time.  NAD, overweight body habitus, polite  HENT:  Head: Normocephalic and atraumatic.  Eyes: Pupils are equal, round, and reactive to light. No scleral icterus.  Conjunctiva pale   Neck:  Neck supple. No thyromegaly present.  Cardiovascular: Normal rate, regular rhythm and normal heart sounds.   Pulmonary/Chest: No respiratory distress. She has no wheezes. She has no rales.  Abdominal: Soft. Bowel sounds are normal.  Musculoskeletal: She exhibits no edema.  Neurological: She is alert and oriented to person, place, and time.  Skin: Skin is warm and dry. No rash noted. No erythema.  Psychiatric: She has a normal mood and affect. Her behavior is normal.     Assessment & Plan:   1. Orthostatic hypotension Lying 112/76 91HR, Sitting 119/76 76HR, Standing 103/73 107HR. - CBC with Differential/Platelet - Comprehensive metabolic panel - Urinalysis Dipstick - TSH  2. Insomnia, unspecified type -Failed on Melatonin. Does not remember taking Trazadone despite previously prescribed  Trazadone. - traZODone (DESYREL) 50 MG tablet; Take 1 tablet (50 mg total) by mouth at bedtime.  Dispense: 30 tablet; Refill: 0 - TSH     Follow-up: Return in about 8 weeks (around 09/15/2016) for f/u DM.   Loletta Specter PA

## 2016-07-22 MED FILL — traZODone HCL 50 MG TABS: 50 | 30 days supply | Qty: 30 | Fill #0

## 2016-08-18 MED FILL — PRAVASTATIN NA 20 MG TAB: 20 | 30 days supply | Qty: 30 | Fill #2

## 2016-08-18 MED FILL — ?METFORMIN HCL 500MG TABLET: 500 | 30 days supply | Qty: 60 | Fill #9

## 2016-08-27 ENCOUNTER — Encounter (HOSPITAL_COMMUNITY): Payer: Self-pay | Admitting: Family Medicine

## 2016-08-27 ENCOUNTER — Ambulatory Visit (HOSPITAL_COMMUNITY)
Admission: EM | Admit: 2016-08-27 | Discharge: 2016-08-27 | Disposition: A | Discharge status: Home / Self Care | Payer: Medicaid Other | Attending: Family Medicine | Admitting: Family Medicine

## 2016-08-27 DIAGNOSIS — M62830 Muscle spasm of back: Secondary | ICD-10-CM

## 2016-08-27 DIAGNOSIS — M545 Low back pain: Secondary | ICD-10-CM

## 2016-08-27 MED ORDER — BACLOFEN 10 MG PO TABS: 10.0000 mg | ORAL_TABLET | Freq: Every evening | ORAL | 0 refills | Status: DC | PRN

## 2016-08-27 MED FILL — BACLOFEN 10 MG TABLET: 10 | 10 days supply | Qty: 10 | Fill #0

## 2016-08-27 NOTE — Discharge Instructions (Signed)
-   Increase ibuprofen to 400mg  (2 tablets) every 6 hours as needed. If this is unhelpful, it is ok to take 600mg  every 6 hours as needed for a few days.  - Also take baclofen as needed at night for back pain/muscle spasm.  - Use a heating pad when seated/laying down for pain.  - If symptoms persist for 6 weeks return for care

## 2016-08-27 NOTE — ED Provider Notes (Signed)
[facility]    CSN: [id number] Arrival date & time: 08/27/16  1024     History   Chief Complaint Chief Complaint  Patient presents with  . Back Pain    HPI Meredith Ryan is a 56 y.o. [place] female presenting with her husband for low back pain described as aching, moderate, intermittent, nonradiating, worsened by twisting and descending stairs, improved for 2 - 3 hours with ibuprofen 200mg , getting neither better or worse over the past 4-5 days. No history of this in the past, no history of injury/trauma/surgery.   HPI  Past Medical History:  Diagnosis Date  . Anemia   . DM type 2 (diabetes mellitus, type 2) (HCC)   . HLD (hyperlipidemia)     Patient Active Problem List   Diagnosis Date Noted  . Palpitations 03/21/2013  . Pain in throat 10/12/2012  . Insomnia 08/26/2010  . ARTHRITIS, LEFT KNEE 01/25/2009  . ARTHRITIS, LEFT HIP 01/15/2009  . HYPERLIPIDEMIA 03/25/2006  . IRON DEFIC ANEMIA SEC DIET IRON INTAKE 03/25/2006  . Type 2 diabetes mellitus (HCC) 07/01/2005    History reviewed. No pertinent surgical history.  OB History    Gravida Para Term Preterm AB Living   5       1 4    SAB TAB Ectopic Multiple Live Births   1               Home Medications    Prior to Admission medications   Medication Sig Start Date End Date Taking? Authorizing Provider  aspirin 81 MG tablet Take 1 tablet (81 mg total) by mouth daily. 06/17/16   Pete Glatter, MD  baclofen (LIORESAL) 10 MG tablet Take 1 tablet (10 mg total) by mouth at bedtime as needed for muscle spasms. 08/27/16   Tyrone Nine, MD  Diclofenac-Misoprostol (ARTHROTEC) 75-0.2 MG TBEC Take by mouth. Reported on 09/17/2015    Historical Provider, MD  ferrous sulfate 325 (65 FE) MG tablet TAKE ONE TABLET BY MOUTH ONCE DAILY WITH  BREAKFAST Patient not taking: Reported on 06/17/2016 06/16/13   Hilarie Fredrickson, MD  fluticasone (FLONASE) 50 MCG/ACT nasal spray Place 2 sprays into the nose daily. Patient not  taking: Reported on 06/17/2016 10/12/12   Hilarie Fredrickson, MD  Melatonin 5 MG CAPS Take 1 capsule (5 mg total) by mouth at bedtime. Patient not taking: Reported on 07/21/2016 06/17/16   Pete Glatter, MD  metFORMIN (GLUCOPHAGE) 500 MG tablet Take 1 tablet (500 mg total) by mouth daily with breakfast. 06/17/16   Pete Glatter, MD  Multiple Vitamins-Minerals (MULTIVITAMIN WITH MINERALS) tablet Take 1 tablet by mouth daily. 05/19/14   Doris Cheadle, MD  nystatin cream (MYCOSTATIN) APPLY TOPICALLY TWO TIMES A DAY Patient not taking: Reported on 06/17/2016 06/07/12   Hilarie Fredrickson, MD  omeprazole (PRILOSEC) 20 MG capsule Take 1 capsule (20 mg total) by mouth daily. Patient not taking: Reported on 06/17/2016 02/14/16   Pete Glatter, MD  pravastatin (PRAVACHOL) 20 MG tablet Take 1 tablet (20 mg total) by mouth daily. 06/17/16   Pete Glatter, MD  traZODone (DESYREL) 50 MG tablet Take 1 tablet (50 mg total) by mouth at bedtime. 07/21/16 08/20/16  Loletta Specter, PA-C    Family History Family History  Problem Relation Age of Onset  . Diabetes Mother   . Diabetes Father     Social History Social History  Substance Use Topics  . Smoking status: Never Smoker  . Smokeless tobacco:  Never Used  . Alcohol use No     Allergies   Patient has no known allergies.   Review of Systems Review of Systems Pt denies any current bowel/bladder problems, fever, chills, unintentional weight loss, night time awakenings secondary to pain, weakness in one or both legs  Physical Exam Triage Vital Signs ED Triage Vitals  Enc Vitals Group     BP 08/27/16 1202 152/76     Pulse Rate 08/27/16 1202 80     Resp 08/27/16 1202 12     Temp 08/27/16 1202 98 F (36.7 C)     Temp Source 08/27/16 1202 Oral     SpO2 08/27/16 1202 100 %     Weight --      Height --      Head Circumference --      Peak Flow --      Pain Score 08/27/16 1212 7     Pain Loc --      Pain Edu? --      Excl. in GC? --    No data  found.   Updated Vital Signs BP 133/74   Pulse 78   Temp 98.1 F (36.7 C)   Resp 18   SpO2 98%   Visual Acuity Right Eye Distance:   Left Eye Distance:   Bilateral Distance:    Right Eye Near:   Left Eye Near:    Bilateral Near:     Physical Exam  Constitutional: She is oriented to person, place, and time. She appears well-developed and well-nourished. No distress.  Eyes: EOM are normal. Pupils are equal, round, and reactive to light. No scleral icterus.  Neck: Neck supple. No JVD present.  Cardiovascular: Normal rate, regular rhythm, normal heart sounds and intact distal pulses.   No murmur heard. Pulmonary/Chest: Effort normal and breath sounds normal. No respiratory distress.  Abdominal: Soft. Bowel sounds are normal. She exhibits no distension. There is no tenderness.  Musculoskeletal:  Back:  Normal skin. Spine with normal alignment and no deformity. No tenderness to vertebral process palpation. L5 paraspinous muscles are mildly tender and spastic L > R.   Range of motion is full at neck and lumbar sacral regions. Straight leg raise is negative Neuro: Gait normal. Sensation and motor function 5/5 bilateral lower extremities. Patellar and achilles DTR's 2+   Lymphadenopathy:    She has no cervical adenopathy.  Neurological: She is alert and oriented to person, place, and time. She exhibits normal muscle tone.  Skin: Skin is warm and dry.  Vitals reviewed.  UC Treatments / Results  Labs (all labs ordered are listed, but only abnormal results are displayed) Labs Reviewed - No data to display  EKG  EKG Interpretation None       Radiology No results found.  Procedures Procedures (including critical care time)  Medications Ordered in UC Medications - No data to display   Initial Impression / Assessment and Plan / UC Course  I have reviewed the triage vital signs and the nursing notes.  Pertinent labs & imaging results that were available during my care of  the patient were reviewed by me and considered in my medical decision making (see chart for details).    Active 56 y.o. female presenting with 5 days of moderate low back pain without red flags or preceding trauma improved with low dose ibuprofen. No known history of renal disease or PUD/GI bleeding, or CHF. Conservative treatments as below with return precautions - Increase ibuprofen to 400mg  (  2 tablets) every 6 hours as needed. If this is unhelpful, it is ok to take 600mg  every 6 hours as needed for a few days.  - Also take baclofen as needed at night for back pain/muscle spasm.  - Use a heating pad when seated/laying down for pain.  - If symptoms persist for 6 weeks return for care   Final Clinical Impressions(s) / UC Diagnoses   Final diagnoses:  Lumbar paraspinal muscle spasm    New Prescriptions New Prescriptions   BACLOFEN (LIORESAL) 10 MG TABLET    Take 1 tablet (10 mg total) by mouth at bedtime as needed for muscle spasms.     Tyrone Nineyan B Lajuanda Penick, MD 08/27/16 1249

## 2016-08-27 NOTE — ED Triage Notes (Signed)
Pt here for lower back pain x 4 days. Denies injury but reports a lot of walking and, bending and moving.

## 2016-09-29 ENCOUNTER — Other Ambulatory Visit: Payer: Self-pay | Admitting: Internal Medicine

## 2016-09-29 ENCOUNTER — Telehealth: Payer: Self-pay | Admitting: Internal Medicine

## 2016-09-29 ENCOUNTER — Ambulatory Visit: Payer: Medicaid Other

## 2016-09-29 MED FILL — PRAVASTATIN NA 20 MG TAB: 20 | 30 days supply | Qty: 30 | Fill #3

## 2016-09-29 NOTE — Telephone Encounter (Signed)
Patient came in today requesting refills on   Pravastatin NA 20 mg tab Metformin HCl 500 mg tablet  Please send refills to [facility]  Please follow up with patient.

## 2016-09-29 NOTE — Telephone Encounter (Signed)
Could you let pt know that she will need to let the pharmacy know she needs a refill on these medications. She has refills at the pharmacy. Patient has an appointment schedule with financial today at 230 if you could let her know thank you.  Pt metformin  there was a quantity of 180 with 3 refills sent 06-17-16 Pt pravastatin  there was a quantity of 90 with 3 refills sent 06-17-16  And in the chart the directions for both medication are take once a day

## 2016-10-06 ENCOUNTER — Other Ambulatory Visit: Payer: Self-pay | Admitting: *Deleted

## 2016-10-06 ENCOUNTER — Ambulatory Visit: Payer: Medicaid Other | Attending: Internal Medicine

## 2016-10-06 MED ORDER — METFORMIN HCL 500 MG PO TABS: 500.0000 mg | ORAL_TABLET | Freq: Every day | ORAL | 3 refills | Status: DC

## 2016-10-06 MED ORDER — ASPIRIN 81 MG PO TABS: 81.0000 mg | ORAL_TABLET | Freq: Every day | ORAL | 3 refills | Status: DC

## 2016-10-06 MED ORDER — PRAVASTATIN SODIUM 20 MG PO TABS: 20.0000 mg | ORAL_TABLET | Freq: Every day | ORAL | 3 refills | Status: DC

## 2016-10-06 MED FILL — metFORMIN HCL 500 MG TABS: 500 | 90 days supply | Qty: 90 | Fill #0

## 2016-10-06 NOTE — Telephone Encounter (Signed)
Patient requested a 90 day supply because she is [place] and will not return for 3 months.

## 2016-10-30 ENCOUNTER — Encounter: Payer: Self-pay | Admitting: Internal Medicine

## 2016-10-31 ENCOUNTER — Encounter: Payer: Self-pay | Admitting: Internal Medicine

## 2016-11-03 ENCOUNTER — Encounter: Payer: Self-pay | Admitting: Internal Medicine

## 2017-03-09 ENCOUNTER — Encounter: Payer: Self-pay | Admitting: Family Medicine

## 2017-03-09 ENCOUNTER — Ambulatory Visit: Payer: Self-pay | Attending: Family Medicine | Admitting: Family Medicine

## 2017-03-09 VITALS — BP 140/82 | HR 96 | Temp 97.8°F | Ht 64.0 in | Wt 156.8 lb

## 2017-03-09 DIAGNOSIS — E119 Type 2 diabetes mellitus without complications: Secondary | ICD-10-CM | POA: Insufficient documentation

## 2017-03-09 DIAGNOSIS — H1013 Acute atopic conjunctivitis, bilateral: Secondary | ICD-10-CM | POA: Insufficient documentation

## 2017-03-09 DIAGNOSIS — D649 Anemia, unspecified: Secondary | ICD-10-CM | POA: Insufficient documentation

## 2017-03-09 DIAGNOSIS — Z23 Encounter for immunization: Secondary | ICD-10-CM

## 2017-03-09 DIAGNOSIS — Z7982 Long term (current) use of aspirin: Secondary | ICD-10-CM | POA: Insufficient documentation

## 2017-03-09 DIAGNOSIS — M545 Low back pain, unspecified: Secondary | ICD-10-CM

## 2017-03-09 DIAGNOSIS — Z7984 Long term (current) use of oral hypoglycemic drugs: Secondary | ICD-10-CM | POA: Insufficient documentation

## 2017-03-09 DIAGNOSIS — E785 Hyperlipidemia, unspecified: Secondary | ICD-10-CM | POA: Insufficient documentation

## 2017-03-09 LAB — GLUCOSE, POCT (MANUAL RESULT ENTRY): POC Glucose: 202 mg/dl — AB (ref 70–99)

## 2017-03-09 LAB — POCT GLYCOSYLATED HEMOGLOBIN (HGB A1C): Hemoglobin A1C: 8.3

## 2017-03-09 MED ORDER — OLOPATADINE HCL 0.1 % OP SOLN: 1.0000 [drp] | Freq: Two times a day (BID) | OPHTHALMIC | 2 refills | Status: DC

## 2017-03-09 MED ORDER — MELOXICAM 7.5 MG PO TABS: 7.5000 mg | ORAL_TABLET | Freq: Every day | ORAL | 0 refills | Status: DC

## 2017-03-09 MED ORDER — CYCLOBENZAPRINE HCL 10 MG PO TABS: 10.0000 mg | ORAL_TABLET | Freq: Two times a day (BID) | ORAL | 2 refills | Status: DC | PRN

## 2017-03-09 MED ORDER — METFORMIN HCL 1000 MG PO TABS: 1000.0000 mg | ORAL_TABLET | Freq: Every day | ORAL | 6 refills | Status: DC

## 2017-03-09 MED FILL — MELOXICAM 7.5 MG TABLET: 7.5 | 30 days supply | Qty: 30 | Fill #0

## 2017-03-09 MED FILL — metFORMIN HCL 1000 MG TABS: 1000 | 90 days supply | Qty: 90 | Fill #0

## 2017-03-09 MED FILL — CYCLOBENZAPRINE 10 MG TAB: 10 | 30 days supply | Qty: 60 | Fill #0

## 2017-03-09 MED FILL — OLOPATADINE HCL 0.1 % SOLN: 0.1 | 20 days supply | Qty: 5 | Fill #0

## 2017-03-09 NOTE — Progress Notes (Signed)
Subjective:  Patient ID: Meredith Ryan, female    DOB: Feb 11, 1961  Age: 56 y.o. MRN: [id number]  CC: Diabetes and Back Pain   HPI Meredith Ryan is a 56 year old female with a history of Type 2 Diabetes Mellitus (A1c 8.3) who presents today for a follow up visit accompanied by her husband.  Her A1c is 8.3 which is increased from 6.8 earlier on in the year and she endorses a reduction in her dose of Metformin. She denies blurry vision or numbness in her extremities. Pravastatin appears on her med list however she has not been taking it.  She complains of a 6 day history of low back pain which does not radiate and is rated as a 9/10 ever since she embarked on a long flight which lasted 14 hours from [place] to [place]. Denies loss of sphincteric function. She has applied OTC analgesic patches with no relief.  Also complains of itchy , watery eyes but no URI symptoms.  Past Medical History:  Diagnosis Date  . Anemia   . DM type 2 (diabetes mellitus, type 2) ([place])   . HLD (hyperlipidemia)     No past surgical history on file.  No Known Allergies   Outpatient Medications Prior to Visit  Medication Sig Dispense Refill  . aspirin 81 MG tablet Take 1 tablet (81 mg total) by mouth daily. 90 tablet 3  . Multiple Vitamins-Minerals (MULTIVITAMIN WITH MINERALS) tablet Take 1 tablet by mouth daily. 30 tablet 2  . baclofen (LIORESAL) 10 MG tablet Take 1 tablet (10 mg total) by mouth at bedtime as needed for muscle spasms. 10 each 0  . metFORMIN (GLUCOPHAGE) 500 MG tablet Take 1 tablet (500 mg total) by mouth daily with breakfast. 180 tablet 3  . Diclofenac-Misoprostol (ARTHROTEC) 75-0.2 MG TBEC Take by mouth. Reported on 09/17/2015    . ferrous sulfate 325 (65 FE) MG tablet TAKE ONE TABLET BY MOUTH ONCE DAILY WITH  BREAKFAST (Patient not taking: Reported on 06/17/2016) 30 tablet 11  . fluticasone (FLONASE) 50 MCG/ACT nasal spray Place 2 sprays into the nose daily. (Patient not taking:  Reported on 06/17/2016) 16 g 6  . Melatonin 5 MG CAPS Take 1 capsule (5 mg total) by mouth at bedtime. (Patient not taking: Reported on 07/21/2016) 90 capsule 3  . nystatin cream (MYCOSTATIN) APPLY TOPICALLY TWO TIMES A DAY (Patient not taking: Reported on 06/17/2016) 30 g 0  . omeprazole (PRILOSEC) 20 MG capsule Take 1 capsule (20 mg total) by mouth daily. (Patient not taking: Reported on 06/17/2016) 30 capsule 0  . pravastatin (PRAVACHOL) 20 MG tablet Take 1 tablet (20 mg total) by mouth daily. (Patient not taking: Reported on 03/09/2017) 90 tablet 3  . traZODone (DESYREL) 50 MG tablet Take 1 tablet (50 mg total) by mouth at bedtime. 30 tablet 0   No facility-administered medications prior to visit.     ROS Review of Systems  Constitutional: Negative for activity change, appetite change and fatigue.  HENT: Negative for congestion, sinus pressure and sore throat.   Eyes: Positive for itching. Negative for visual disturbance.  Respiratory: Negative for cough, chest tightness, shortness of breath and wheezing.   Cardiovascular: Negative for chest pain and palpitations.  Gastrointestinal: Negative for abdominal distention, abdominal pain and constipation.  Endocrine: Negative for polydipsia.  Genitourinary: Negative for dysuria and frequency.  Musculoskeletal: Positive for back pain. Negative for arthralgias.  Skin: Negative for rash.  Neurological: Negative for tremors, light-headedness and numbness.  Hematological: Does  not bruise/bleed easily.  Psychiatric/Behavioral: Negative for agitation and behavioral problems.    Objective:  BP 140/82   Pulse 96   Temp 97.8 F (36.6 C) (Oral)   Ht 5' 4"  (1.626 m)   Wt 156 lb 12.8 oz (71.1 kg)   SpO2 98%   BMI 26.91 kg/m   BP/Weight 03/09/2017 8/88/9169 09/19/386  Systolic BP 828 003 491  Diastolic BP 82 74 80  Wt. (Lbs) 156.8 - 147.8  BMI 26.91 - 25.37      Physical Exam  Constitutional: She is oriented to person, place, and time. She  appears well-developed and well-nourished.  Eyes: Pupils are equal, round, and reactive to light. Conjunctivae and EOM are normal.  Cardiovascular: Normal rate, normal heart sounds and intact distal pulses.   No murmur heard. Pulmonary/Chest: Effort normal and breath sounds normal. She has no wheezes. She has no rales. She exhibits no tenderness.  Abdominal: Soft. Bowel sounds are normal. She exhibits no distension and no mass. There is no tenderness.  Musculoskeletal: She exhibits tenderness (tenderness on palpation of lumbar spine and paraspinal lumbar muscles; negative straight leg rasie).  Painful ROM  Neurological: She is alert and oriented to person, place, and time.  Psychiatric: She has a normal mood and affect.    Lab Results  Component Value Date   HGBA1C 8.3 03/09/2017    CMP Latest Ref Rng & Units 07/21/2016 06/17/2016 09/17/2015  Glucose 65 - 99 mg/dL 113(H) 67 65  BUN 7 - 25 mg/dL 18 13 13   Creatinine 0.50 - 1.05 mg/dL 0.77 0.70 0.57  Sodium 135 - 146 mmol/L 137 137 138  Potassium 3.5 - 5.3 mmol/L 4.6 4.6 4.4  Chloride 98 - 110 mmol/L 100 98 102  CO2 20 - 31 mmol/L 30 26 25   Calcium 8.6 - 10.4 mg/dL 9.6 9.5 9.3  Total Protein 6.1 - 8.1 g/dL 7.7 7.7 7.2  Total Bilirubin 0.2 - 1.2 mg/dL 0.3 0.3 0.2  Alkaline Phos 33 - 130 U/L 61 62 68  AST 10 - 35 U/L 18 18 18   ALT 6 - 29 U/L 12 13 11     Lipid Panel     Component Value Date/Time   CHOL 157 10/01/2015 1505   TRIG 170 (H) 10/01/2015 1505   HDL 50 10/01/2015 1505   CHOLHDL 3.1 10/01/2015 1505   VLDL 34 (H) 10/01/2015 1505   LDLCALC 73 10/01/2015 1505    Assessment & Plan:   1. Type 2 diabetes mellitus without complication, without long-term current use of insulin (HCC) Uncontrolled with A1c of 8.3 Increase dose of metformin Diabetic diet, lifestyle modifications Consider statin at next visit - POCT glucose (manual entry) - POCT glycosylated hemoglobin (Hb A1C) - metFORMIN (GLUCOPHAGE) 1000 MG tablet; Take 1  tablet (1,000 mg total) by mouth daily with breakfast.  Dispense: 120 tablet; Refill: 6 - CMP14+EGFR; Future - Lipid panel; Future - Microalbumin/Creatinine Ratio, Urine; Future  2. Acute midline low back pain without sciatica Possibly underlying Osteoarthritis exacerbated by recent travel Apply heat Demonstrated stretching exercised - meloxicam (MOBIC) 7.5 MG tablet; Take 1 tablet (7.5 mg total) by mouth daily.  Dispense: 30 tablet; Refill: 0 - cyclobenzaprine (FLEXERIL) 10 MG tablet; Take 1 tablet (10 mg total) by mouth 2 (two) times daily as needed for muscle spasms.  Dispense: 60 tablet; Refill: 2  3. Allergic conjunctivitis of both eyes - olopatadine (PATANOL) 0.1 % ophthalmic solution; Place 1 drop into both eyes 2 (two) times daily.  Dispense: 5 mL;  Refill: 2  4. Need for influenza Flu shot administered  Meds ordered this encounter  Medications  . metFORMIN (GLUCOPHAGE) 1000 MG tablet    Sig: Take 1 tablet (1,000 mg total) by mouth daily with breakfast.    Dispense:  120 tablet    Refill:  6    Release 90 day supply  . olopatadine (PATANOL) 0.1 % ophthalmic solution    Sig: Place 1 drop into both eyes 2 (two) times daily.    Dispense:  5 mL    Refill:  2  . meloxicam (MOBIC) 7.5 MG tablet    Sig: Take 1 tablet (7.5 mg total) by mouth daily.    Dispense:  30 tablet    Refill:  0  . cyclobenzaprine (FLEXERIL) 10 MG tablet    Sig: Take 1 tablet (10 mg total) by mouth 2 (two) times daily as needed for muscle spasms.    Dispense:  60 tablet    Refill:  2    Follow-up: Return in about 3 months (around 06/08/2017) for follow up on Diabetes.   Arnoldo Morale MD

## 2017-03-09 NOTE — Patient Instructions (Signed)

## 2017-03-10 ENCOUNTER — Other Ambulatory Visit: Payer: Self-pay

## 2017-03-13 ENCOUNTER — Ambulatory Visit: Payer: Self-pay | Attending: Family Medicine

## 2017-03-13 DIAGNOSIS — E119 Type 2 diabetes mellitus without complications: Secondary | ICD-10-CM | POA: Insufficient documentation

## 2017-03-13 NOTE — Progress Notes (Signed)
Patient here for lab visit only 

## 2017-03-14 LAB — CMP14+EGFR
A/G RATIO: 1.3 (ref 1.2–2.2)
ALBUMIN: 4.1 g/dL (ref 3.5–5.5)
ALK PHOS: 77 IU/L (ref 39–117)
ALT: 12 IU/L (ref 0–32)
AST: 19 IU/L (ref 0–40)
BILIRUBIN TOTAL: 0.3 mg/dL (ref 0.0–1.2)
BUN / CREAT RATIO: 25 — AB (ref 9–23)
BUN: 16 mg/dL (ref 6–24)
CHLORIDE: 99 mmol/L (ref 96–106)
CO2: 26 mmol/L (ref 20–29)
CREATININE: 0.64 mg/dL (ref 0.57–1.00)
Calcium: 9.2 mg/dL (ref 8.7–10.2)
GFR calc Af Amer: 114 mL/min/{1.73_m2} (ref >59)
GFR calc non Af Amer: 99 mL/min/{1.73_m2} (ref >59)
GLOBULIN, TOTAL: 3.2 g/dL (ref 1.5–4.5)
Glucose: 152 mg/dL — ABNORMAL HIGH (ref 65–99)
POTASSIUM: 4.9 mmol/L (ref 3.5–5.2)
SODIUM: 139 mmol/L (ref 134–144)
Total Protein: 7.3 g/dL (ref 6.0–8.5)

## 2017-03-14 LAB — LIPID PANEL
CHOLESTEROL TOTAL: 241 mg/dL — AB (ref 100–199)
Chol/HDL Ratio: 4.6 ratio — ABNORMAL HIGH (ref 0.0–4.4)
HDL: 52 mg/dL (ref >39)
LDL CALC: 162 mg/dL — AB (ref 0–99)
Triglycerides: 133 mg/dL (ref 0–149)
VLDL Cholesterol Cal: 27 mg/dL (ref 5–40)

## 2017-03-14 LAB — MICROALBUMIN / CREATININE URINE RATIO
Creatinine, Urine: 51.9 mg/dL
MICROALB/CREAT RATIO: 6.9 mg/g{creat} (ref 0.0–30.0)
MICROALBUM., U, RANDOM: 3.6 ug/mL

## 2017-03-17 ENCOUNTER — Other Ambulatory Visit: Payer: Self-pay | Admitting: Family Medicine

## 2017-03-17 MED ORDER — ATORVASTATIN CALCIUM 20 MG PO TABS: 20.0000 mg | ORAL_TABLET | Freq: Every day | ORAL | 3 refills | Status: DC

## 2017-03-19 ENCOUNTER — Other Ambulatory Visit: Payer: Self-pay

## 2017-03-19 ENCOUNTER — Telehealth: Payer: Self-pay

## 2017-03-19 MED ORDER — ATORVASTATIN CALCIUM 20 MG PO TABS: 20.0000 mg | ORAL_TABLET | Freq: Every day | ORAL | 3 refills | Status: DC

## 2017-03-19 MED FILL — ?ATORVASTATIN 20 MG TABLET: 20 | 30 days supply | Qty: 30 | Fill #0

## 2017-03-19 NOTE — Telephone Encounter (Signed)
Pt's husband was called and informed of lab results.

## 2017-04-27 ENCOUNTER — Other Ambulatory Visit: Payer: Self-pay | Admitting: Physician Assistant

## 2017-04-27 ENCOUNTER — Other Ambulatory Visit: Payer: Self-pay | Admitting: Family Medicine

## 2017-04-27 DIAGNOSIS — M545 Low back pain, unspecified: Secondary | ICD-10-CM

## 2017-04-27 DIAGNOSIS — G4700 Insomnia, unspecified: Secondary | ICD-10-CM

## 2017-04-27 MED FILL — ?CYCLOBENZAPRINE 10 MG TABL: 10 | 30 days supply | Qty: 60 | Fill #1

## 2017-04-27 MED FILL — ?ATORVASTATIN 20 MG TABLET: 20 | 30 days supply | Qty: 30 | Fill #1

## 2017-04-28 MED FILL — MELOXICAM 7.5 MG TABLET: 7.5 | 30 days supply | Qty: 30 | Fill #0

## 2017-04-29 NOTE — Telephone Encounter (Signed)
FWD to PCP. Tempestt S Roberts, CMA  

## 2017-06-10 ENCOUNTER — Ambulatory Visit: Payer: Self-pay | Admitting: Family Medicine

## 2017-06-12 ENCOUNTER — Ambulatory Visit: Payer: Self-pay | Attending: Family Medicine | Admitting: Family Medicine

## 2017-06-12 ENCOUNTER — Encounter: Payer: Self-pay | Admitting: Family Medicine

## 2017-06-12 VITALS — BP 131/85 | HR 68 | Temp 98.4°F | Resp 16 | Ht 63.0 in | Wt 154.0 lb

## 2017-06-12 DIAGNOSIS — Z7982 Long term (current) use of aspirin: Secondary | ICD-10-CM | POA: Insufficient documentation

## 2017-06-12 DIAGNOSIS — Z7984 Long term (current) use of oral hypoglycemic drugs: Secondary | ICD-10-CM | POA: Insufficient documentation

## 2017-06-12 DIAGNOSIS — G4700 Insomnia, unspecified: Secondary | ICD-10-CM | POA: Insufficient documentation

## 2017-06-12 DIAGNOSIS — E119 Type 2 diabetes mellitus without complications: Secondary | ICD-10-CM | POA: Insufficient documentation

## 2017-06-12 DIAGNOSIS — Z79899 Other long term (current) drug therapy: Secondary | ICD-10-CM | POA: Insufficient documentation

## 2017-06-12 DIAGNOSIS — E7800 Pure hypercholesterolemia, unspecified: Secondary | ICD-10-CM | POA: Insufficient documentation

## 2017-06-12 LAB — GLUCOSE, POCT (MANUAL RESULT ENTRY): POC GLUCOSE: 133 mg/dL — AB (ref 70–99)

## 2017-06-12 LAB — POCT GLYCOSYLATED HEMOGLOBIN (HGB A1C): HEMOGLOBIN A1C: 8.5

## 2017-06-12 MED ORDER — TRAZODONE HCL 50 MG PO TABS: 50.0000 mg | ORAL_TABLET | Freq: Every day | ORAL | 3 refills | Status: DC

## 2017-06-12 MED ORDER — GLIPIZIDE 5 MG PO TABS: 5.0000 mg | ORAL_TABLET | Freq: Two times a day (BID) | ORAL | 3 refills | Status: DC

## 2017-06-12 MED ORDER — METFORMIN HCL 500 MG PO TABS: 500.0000 mg | ORAL_TABLET | Freq: Every day | ORAL | 3 refills | Status: DC

## 2017-06-12 MED ORDER — ATORVASTATIN CALCIUM 20 MG PO TABS: 20.0000 mg | ORAL_TABLET | Freq: Every day | ORAL | 3 refills | Status: DC

## 2017-06-12 MED FILL — ?METFORMIN HCL 500MG TABLET: 500 | 30 days supply | Qty: 30 | Fill #0

## 2017-06-12 MED FILL — ?GLIPIZIDE 5MG TABLET: 5 | 30 days supply | Qty: 60 | Fill #0

## 2017-06-12 MED FILL — traZODone HCL 50 MG TABS: 50 | 30 days supply | Qty: 30 | Fill #0

## 2017-06-12 MED FILL — ?ATORVASTATIN 20 MG TABLET: 20 | 30 days supply | Qty: 30 | Fill #0

## 2017-06-12 NOTE — Progress Notes (Signed)
Subjective:  Patient ID: Meredith Ryan, female    DOB: December 15, 1960  Age: 56 y.o. MRN: [id number]  CC: Diabetes mellitus  HPI Meredith Planisha Schnee is a 56 year old female with a history of type 2 diabetes mellitus (A1c 8.5), hyperlipidemia who presents today for a follow-up visit accompanied by her daughter.  She complains of reduced appetite and weight loss with metformin and would like to be placed back on 500 mg of metformin rather than the 1000 mg which did not affect her appetite is much. She denies visual concerns or numbness in extremities.  She has been compliant with her statin and denies myalgias or other adverse effects.  She complains of insomnia and sleeping for only 5 hours at other times less than that despite not indulging in caffeinated beverages or taking daytime naps. Review of her med list indicates she was on trazodone previously which she ran out of.  Past Medical History:  Diagnosis Date  . Anemia   . DM type 2 (diabetes mellitus, type 2) (HCC)   . HLD (hyperlipidemia)     History reviewed. No pertinent surgical history.  No Known Allergies   Outpatient Medications Prior to Visit  Medication Sig Dispense Refill  . aspirin 81 MG tablet Take 1 tablet (81 mg total) by mouth daily. 90 tablet 3  . cyclobenzaprine (FLEXERIL) 10 MG tablet Take 1 tablet (10 mg total) by mouth 2 (two) times daily as needed for muscle spasms. 60 tablet 2  . meloxicam (MOBIC) 7.5 MG tablet TAKE 1 TABLET BY MOUTH DAILY. 30 tablet 0  . Multiple Vitamins-Minerals (MULTIVITAMIN WITH MINERALS) tablet Take 1 tablet by mouth daily. 30 tablet 2  . olopatadine (PATANOL) 0.1 % ophthalmic solution Place 1 drop into both eyes 2 (two) times daily. 5 mL 2  . atorvastatin (LIPITOR) 20 MG tablet Take 1 tablet (20 mg total) by mouth daily. 30 tablet 3  . metFORMIN (GLUCOPHAGE) 1000 MG tablet Take 1 tablet (1,000 mg total) by mouth daily with breakfast. 120 tablet 6  . traZODone (DESYREL) 50 MG tablet Take  50 mg by mouth at bedtime.     No facility-administered medications prior to visit.     ROS Review of Systems  Constitutional: Negative for activity change, appetite change and fatigue.  HENT: Negative for congestion, sinus pressure and sore throat.   Eyes: Negative for visual disturbance.  Respiratory: Negative for cough, chest tightness, shortness of breath and wheezing.   Cardiovascular: Negative for chest pain and palpitations.  Gastrointestinal: Negative for abdominal distention, abdominal pain and constipation.  Endocrine: Negative for polydipsia.  Genitourinary: Negative for dysuria and frequency.  Musculoskeletal: Negative for arthralgias and back pain.  Skin: Negative for rash.  Neurological: Negative for tremors, light-headedness and numbness.  Hematological: Does not bruise/bleed easily.  Psychiatric/Behavioral: Positive for sleep disturbance. Negative for agitation and behavioral problems.    Objective:  BP 131/85 (BP Location: Right Arm, Patient Position: Sitting, Cuff Size: Normal)   Pulse 68   Temp 98.4 F (36.9 C) (Oral)   Resp 16   Ht 5\' 3"  (1.6 m)   Wt 154 lb (69.9 kg)   SpO2 100%   BMI 27.28 kg/m   BP/Weight 06/12/2017 03/09/2017 08/27/2016  Systolic BP 131 140 133  Diastolic BP 85 82 74  Wt. (Lbs) 154 156.8 -  BMI 27.28 26.91 -      Physical Exam  Constitutional: She is oriented to person, place, and time. She appears well-developed and well-nourished.  Cardiovascular: Normal rate, normal  heart sounds and intact distal pulses.  No murmur heard. Pulmonary/Chest: Effort normal and breath sounds normal. She has no wheezes. She has no rales. She exhibits no tenderness.  Abdominal: Soft. Bowel sounds are normal. She exhibits no distension and no mass. There is no tenderness.  Musculoskeletal: Normal range of motion.  Neurological: She is alert and oriented to person, place, and time.  Skin: Skin is warm and dry.  Psychiatric: She has a normal mood and  affect.     Lab Results  Component Value Date   HGBA1C 8.5 06/12/2017    Assessment & Plan:   1. Type 2 diabetes mellitus without complication, without long-term current use of insulin (HCC) Uncontrolled with A1c of 8.5 which has trended up from 8.3 previously Reduced dose of metformin from 1000 mg daily to 500 mg daily due to GI side effects Glipizide added to regimen Educated on diabetic diet, my plate method, lifestyle modifications - Glucose (CBG) - HgB A1c - metFORMIN (GLUCOPHAGE) 500 MG tablet; Take 1 tablet (500 mg total) by mouth daily with breakfast.  Dispense: 30 tablet; Refill: 3 - glipiZIDE (GLUCOTROL) 5 MG tablet; Take 1 tablet (5 mg total) by mouth 2 (two) times daily before a meal.  Dispense: 60 tablet; Refill: 3  2. Pure hypercholesterolemia Uncontrolled Atorvastatin was commenced at her last visit Low-cholesterol diet Check lipid panel at next visitb - atorvastatin (LIPITOR) 20 MG tablet; Take 1 tablet (20 mg total) by mouth daily.  Dispense: 30 tablet; Refill: 3  3. Insomnia, unspecified type Discussed sleep hygiene Avoid caffeinated products later in the day. - traZODone (DESYREL) 50 MG tablet; Take 1 tablet (50 mg total) by mouth at bedtime.  Dispense: 30 tablet; Refill: 3   Meds ordered this encounter  Medications  . atorvastatin (LIPITOR) 20 MG tablet    Sig: Take 1 tablet (20 mg total) by mouth daily.    Dispense:  30 tablet    Refill:  3  . traZODone (DESYREL) 50 MG tablet    Sig: Take 1 tablet (50 mg total) by mouth at bedtime.    Dispense:  30 tablet    Refill:  3  . metFORMIN (GLUCOPHAGE) 500 MG tablet    Sig: Take 1 tablet (500 mg total) by mouth daily with breakfast.    Dispense:  30 tablet    Refill:  3    Decrease previous dose  . glipiZIDE (GLUCOTROL) 5 MG tablet    Sig: Take 1 tablet (5 mg total) by mouth 2 (two) times daily before a meal.    Dispense:  60 tablet    Refill:  3    Follow-up: Return in about 3 months (around  09/10/2017) for follow up on diabetes mellitus.   Jaclyn ShaggyEnobong Amao MD

## 2017-06-12 NOTE — Patient Instructions (Signed)
Diabetes Mellitus and Nutrition When you have diabetes (diabetes mellitus), it is very important to have healthy eating habits because your blood sugar (glucose) levels are greatly affected by what you eat and drink. Eating healthy foods in the appropriate amounts, at about the same times every day, can help you:  Control your blood glucose.  Lower your risk of heart disease.  Improve your blood pressure.  Reach or maintain a healthy weight.  Every person with diabetes is different, and each person has different needs for a meal plan. Your health care provider may recommend that you work with a diet and nutrition specialist (dietitian) to make a meal plan that is best for you. Your meal plan may vary depending on factors such as:  The calories you need.  The medicines you take.  Your weight.  Your blood glucose, blood pressure, and cholesterol levels.  Your activity level.  Other health conditions you have, such as heart or kidney disease.  How do carbohydrates affect me? Carbohydrates affect your blood glucose level more than any other type of food. Eating carbohydrates naturally increases the amount of glucose in your blood. Carbohydrate counting is a method for keeping track of how many carbohydrates you eat. Counting carbohydrates is important to keep your blood glucose at a healthy level, especially if you use insulin or take certain oral diabetes medicines. It is important to know how many carbohydrates you can safely have in each meal. This is different for every person. Your dietitian can help you calculate how many carbohydrates you should have at each meal and for snack. Foods that contain carbohydrates include:  Bread, cereal, rice, pasta, and crackers.  Potatoes and corn.  Peas, beans, and lentils.  Milk and yogurt.  Fruit and juice.  Desserts, such as cakes, cookies, ice cream, and candy.  How does alcohol affect me? Alcohol can cause a sudden decrease in blood  glucose (hypoglycemia), especially if you use insulin or take certain oral diabetes medicines. Hypoglycemia can be a life-threatening condition. Symptoms of hypoglycemia (sleepiness, dizziness, and confusion) are similar to symptoms of having too much alcohol. If your health care provider says that alcohol is safe for you, follow these guidelines:  Limit alcohol intake to no more than 1 drink per day for nonpregnant women and 2 drinks per day for men. One drink equals 12 oz of beer, 5 oz of wine, or 1 oz of hard liquor.  Do not drink on an empty stomach.  Keep yourself hydrated with water, diet soda, or unsweetened iced tea.  Keep in mind that regular soda, juice, and other mixers may contain a lot of sugar and must be counted as carbohydrates.  What are tips for following this plan? Reading food labels  Start by checking the serving size on the label. The amount of calories, carbohydrates, fats, and other nutrients listed on the label are based on one serving of the food. Many foods contain more than one serving per package.  Check the total grams (g) of carbohydrates in one serving. You can calculate the number of servings of carbohydrates in one serving by dividing the total carbohydrates by 15. For example, if a food has 30 g of total carbohydrates, it would be equal to 2 servings of carbohydrates.  Check the number of grams (g) of saturated and trans fats in one serving. Choose foods that have low or no amount of these fats.  Check the number of milligrams (mg) of sodium in one serving. Most people   should limit total sodium intake to less than 2,300 mg per day.  Always check the nutrition information of foods labeled as "low-fat" or "nonfat". These foods may be higher in added sugar or refined carbohydrates and should be avoided.  Talk to your dietitian to identify your daily goals for nutrients listed on the label. Shopping  Avoid buying canned, premade, or processed foods. These  foods tend to be high in fat, sodium, and added sugar.  Shop around the outside edge of the grocery store. This includes fresh fruits and vegetables, bulk grains, fresh meats, and fresh dairy. Cooking  Use low-heat cooking methods, such as baking, instead of high-heat cooking methods like deep frying.  Cook using healthy oils, such as olive, canola, or sunflower oil.  Avoid cooking with butter, cream, or high-fat meats. Meal planning  Eat meals and snacks regularly, preferably at the same times every day. Avoid going long periods of time without eating.  Eat foods high in fiber, such as fresh fruits, vegetables, beans, and whole grains. Talk to your dietitian about how many servings of carbohydrates you can eat at each meal.  Eat 4-6 ounces of lean protein each day, such as lean meat, chicken, fish, eggs, or tofu. 1 ounce is equal to 1 ounce of meat, chicken, or fish, 1 egg, or 1/4 cup of tofu.  Eat some foods each day that contain healthy fats, such as avocado, nuts, seeds, and fish. Lifestyle   Check your blood glucose regularly.  Exercise at least 30 minutes 5 or more days each week, or as told by your health care provider.  Take medicines as told by your health care provider.  Do not use any products that contain nicotine or tobacco, such as cigarettes and e-cigarettes. If you need help quitting, ask your health care provider.  Work with a counselor or diabetes educator to identify strategies to manage stress and any emotional and social challenges. What are some questions to ask my health care provider?  Do I need to meet with a diabetes educator?  Do I need to meet with a dietitian?  What number can I call if I have questions?  When are the best times to check my blood glucose? Where to find more information:  American Diabetes Association: diabetes.org/food-and-fitness/food  Academy of Nutrition and Dietetics:  www.eatright.org/resources/health/diseases-and-conditions/diabetes  National Institute of Diabetes and Digestive and Kidney Diseases (NIH): www.niddk.nih.gov/health-information/diabetes/overview/diet-eating-physical-activity Summary  A healthy meal plan will help you control your blood glucose and maintain a healthy lifestyle.  Working with a diet and nutrition specialist (dietitian) can help you make a meal plan that is best for you.  Keep in mind that carbohydrates and alcohol have immediate effects on your blood glucose levels. It is important to count carbohydrates and to use alcohol carefully. This information is not intended to replace advice given to you by your health care provider. Make sure you discuss any questions you have with your health care provider. Document Released: 02/27/2005 Document Revised: 07/07/2016 Document Reviewed: 07/07/2016 Elsevier Interactive Patient Education  2018 Elsevier Inc.  

## 2017-07-15 MED FILL — ?METFORMIN HCL 500MG TABLET: 500 | 30 days supply | Qty: 30 | Fill #1

## 2017-07-28 MED FILL — ?ATORVASTATIN 20MG TABL: 20 | 30 days supply | Qty: 30 | Fill #1

## 2017-07-28 MED FILL — ?GLIPIZIDE 5MG TABLET: 5 | 30 days supply | Qty: 60 | Fill #1

## 2017-08-12 ENCOUNTER — Telehealth: Payer: Self-pay | Admitting: Family Medicine

## 2017-08-12 DIAGNOSIS — E7800 Pure hypercholesterolemia, unspecified: Secondary | ICD-10-CM

## 2017-08-12 DIAGNOSIS — H1013 Acute atopic conjunctivitis, bilateral: Secondary | ICD-10-CM

## 2017-08-12 DIAGNOSIS — G4700 Insomnia, unspecified: Secondary | ICD-10-CM

## 2017-08-12 DIAGNOSIS — E119 Type 2 diabetes mellitus without complications: Secondary | ICD-10-CM

## 2017-08-12 MED ORDER — GLIPIZIDE 5 MG PO TABS: 5.0000 mg | ORAL_TABLET | Freq: Two times a day (BID) | ORAL | 0 refills | Status: DC

## 2017-08-12 MED ORDER — ASPIRIN 81 MG PO TABS: 81.0000 mg | ORAL_TABLET | Freq: Every day | ORAL | 0 refills | Status: DC

## 2017-08-12 MED ORDER — OLOPATADINE HCL 0.1 % OP SOLN: 1.0000 [drp] | Freq: Two times a day (BID) | OPHTHALMIC | 0 refills | Status: DC

## 2017-08-12 MED ORDER — TRAZODONE HCL 50 MG PO TABS: 50.0000 mg | ORAL_TABLET | Freq: Every day | ORAL | 0 refills | Status: DC

## 2017-08-12 MED ORDER — ATORVASTATIN CALCIUM 20 MG PO TABS: 20.0000 mg | ORAL_TABLET | Freq: Every day | ORAL | 0 refills | Status: DC

## 2017-08-12 MED ORDER — METFORMIN HCL 500 MG PO TABS: 500.0000 mg | ORAL_TABLET | Freq: Every day | ORAL | 0 refills | Status: DC

## 2017-08-12 MED FILL — OLOPATADINE HCL 0.1% EYE DR: 0.1 | 12 days supply | Qty: 5 | Fill #0

## 2017-08-12 MED FILL — ?METFORMIN HCL 500MG TABLET: 500 | 30 days supply | Qty: 30 | Fill #0

## 2017-08-12 MED FILL — traZODone HCL 50 MG TABS: 50 | 30 days supply | Qty: 30 | Fill #0

## 2017-08-12 NOTE — Telephone Encounter (Signed)
Refilled chronic meds x 90 days

## 2017-08-12 NOTE — Telephone Encounter (Signed)
aspirin 81 MG tablet  Take 1 tablet (81 mg total) by mouth daily.   atorvastatin 20 MG tablet  Commonly known as: LIPITOR  Take 1 tablet (20 mg total) by mouth daily.   glipiZIDE 5 MG tablet  Commonly known as: GLUCOTROL  Take 1 tablet (5 mg total) by mouth 2 (two) times daily before a meal.   metFORMIN 500 MG tablet  Commonly known as: GLUCOPHAGE  Take 1 tablet (500 mg total) by mouth daily with breakfast.   multivitamin with minerals tablet  Take 1 tablet by mouth daily.   olopatadine 0.1 % ophthalmic solution  Commonly known as: PATANOL  Place 1 drop into both eyes 2 (two) times daily.   traZODone 50 MG tablet  Commonly known as: DESYREL     Patient requested for a REFILL ON LISTED MEDICATIONS. [place] out [place] and will be gone for 2 mnths due to a death in the family.  [facility]

## 2017-09-11 ENCOUNTER — Ambulatory Visit: Payer: Medicaid Other | Admitting: Family Medicine

## 2017-09-13 IMAGING — DX DG LUMBAR SPINE COMPLETE 4+V
5 series · 5 of 5 positions shown · non-contrast
Comparison: April 11, 2006

CLINICAL DATA: Chronic lumbago with radicular symptoms right lower
extremity for approximately 1 month

EXAM:
LUMBAR SPINE - COMPLETE 4+ VIEW

[t lumbar spine ap]
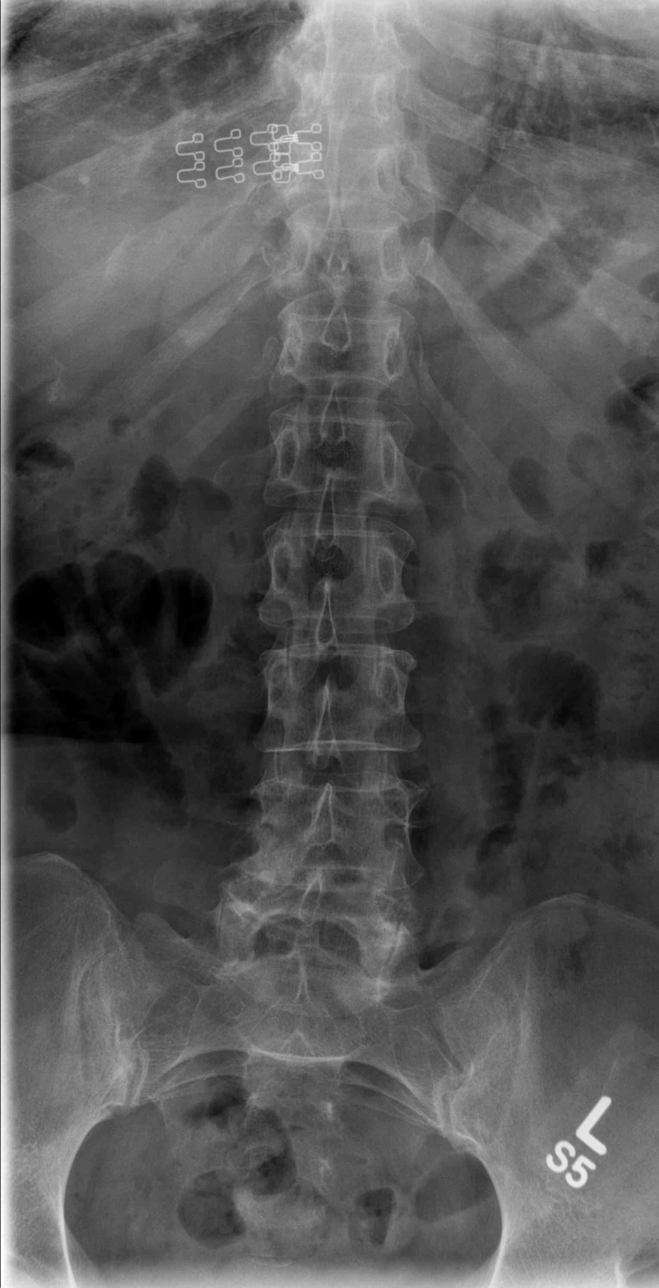

[t lumbar spine obl (1 of 2)]
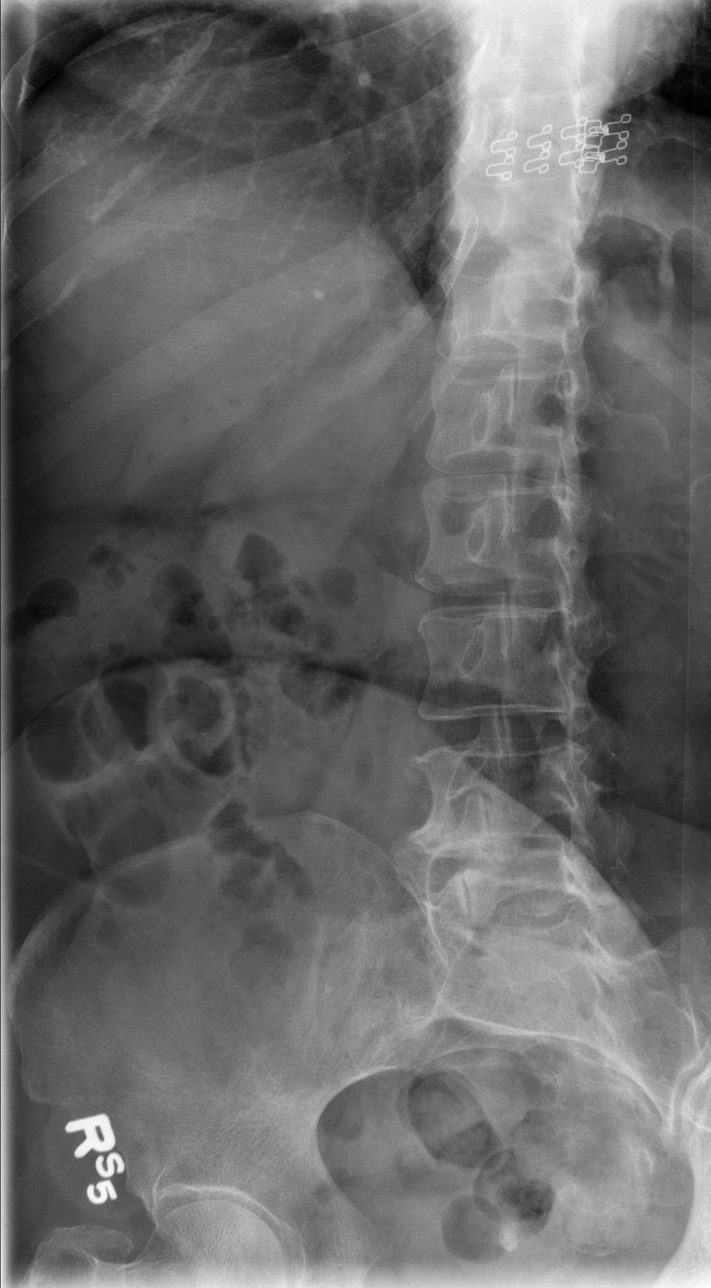

[t lumbar spine obl (2 of 2)]
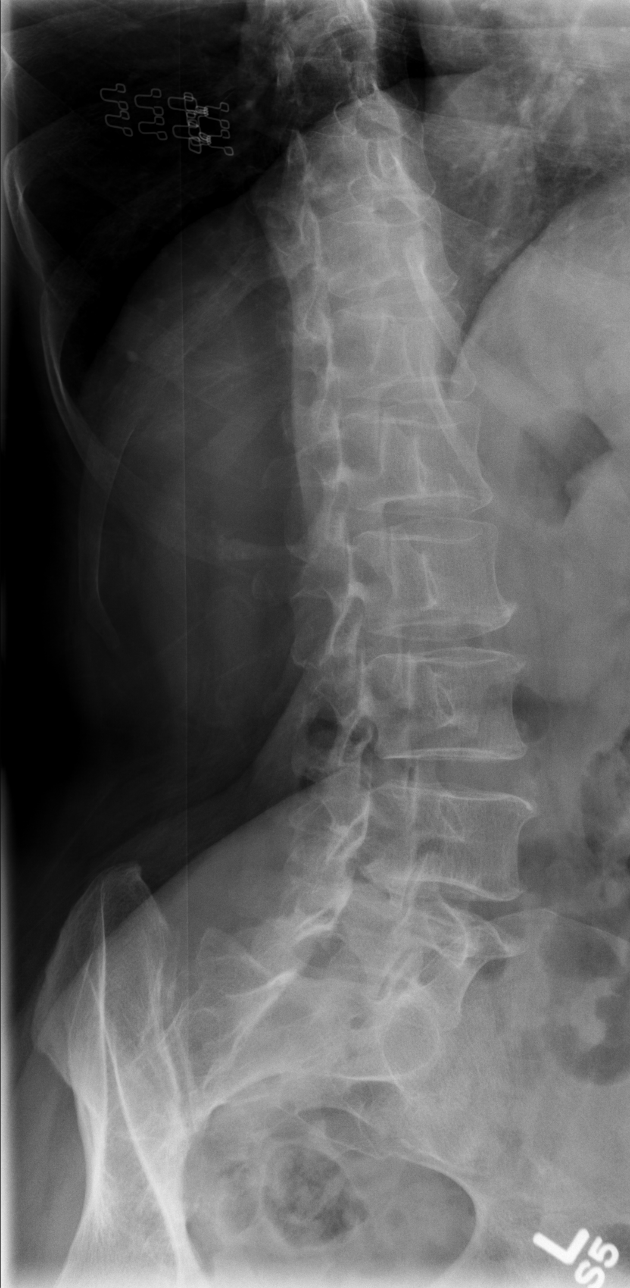

[t lumbar spine lat]
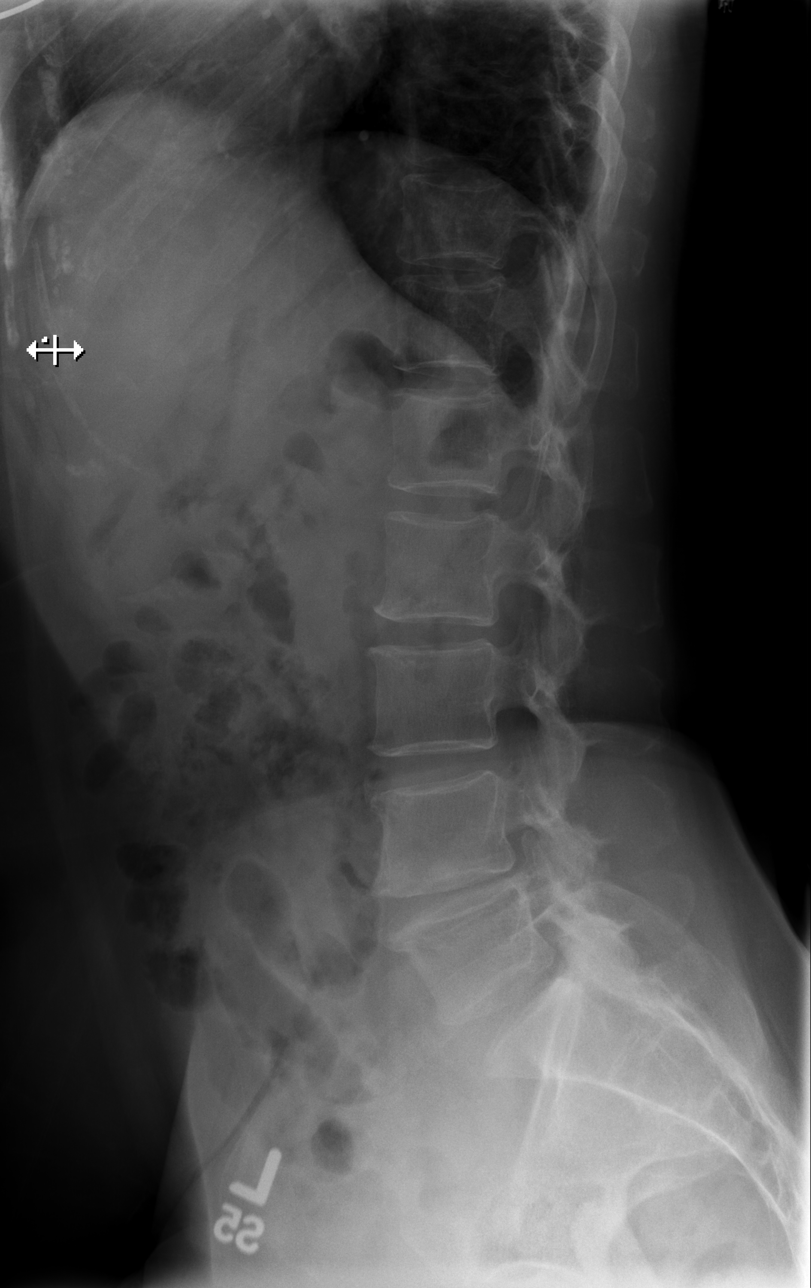

[t lumbar l-5 s-1 spot]
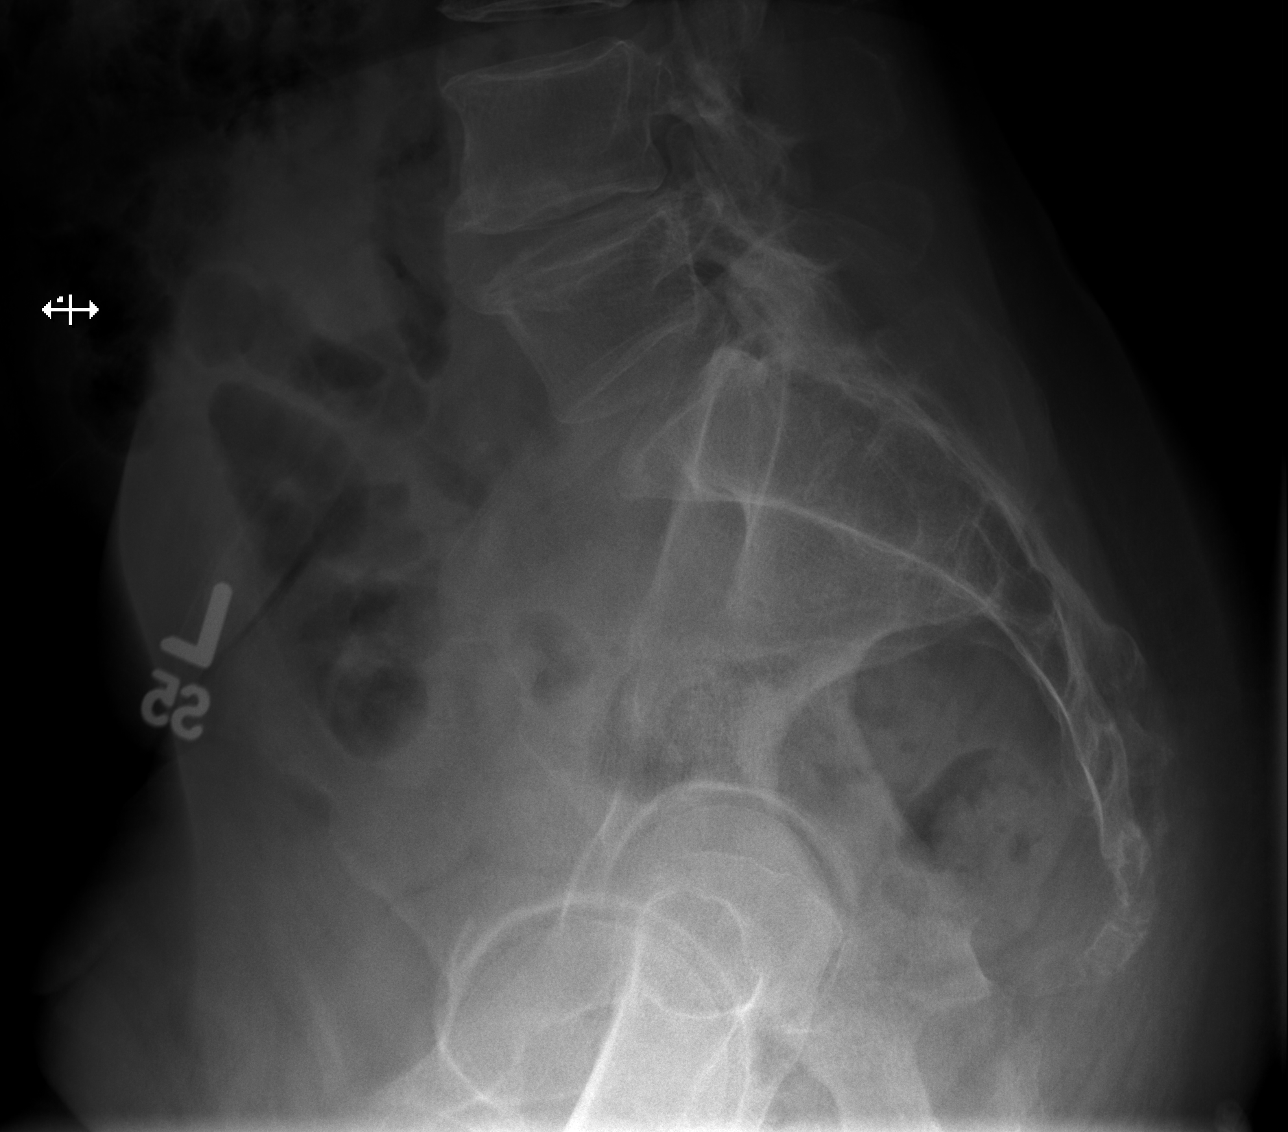

[5 of 5 positions shown; findings below may reference images not displayed]

FINDINGS: Frontal, lateral, spot lumbosacral lateral, and bilateral oblique
views were obtained. There are 5 non-rib-bearing lumbar type
vertebral bodies. There is slight lower lumbar levoscoliosis. There
is no fracture or spondylolisthesis pin is moderate disc space
narrowing at L4-5 with slight disc space narrowing at L5-S1. There
are anterior osteophytes at L4 and L5. There is facet osteoarthritic
change at L4-5 and L5-S1 bilaterally.
IMPRESSION: Areas of osteoarthritic change in the lower lumbar region. Slight
scoliosis. No fracture or spondylolisthesis.

## 2017-09-25 MED FILL — metFORMIN HCL 500 MG TABS: 500 | 30 days supply | Qty: 30 | Fill #1

## 2017-09-25 MED FILL — ATORVASTATIN 20 MG TABLET: 20 | 30 days supply | Qty: 30 | Fill #2

## 2017-09-25 MED FILL — glipiZIDE 5 MG TABS: 5 | 30 days supply | Qty: 60 | Fill #2

## 2017-10-26 MED FILL — ATORVASTATIN 20 MG TABLET: 20 | 30 days supply | Qty: 30 | Fill #3

## 2017-11-10 MED FILL — metFORMIN HCL 500 MG TABS: 500 | 30 days supply | Qty: 30 | Fill #2

## 2017-11-12 MED FILL — glipiZIDE 5 MG TABS: 5 | 30 days supply | Qty: 60 | Fill #3

## 2017-11-12 MED FILL — traZODone HCL 50 MG TABS: 50 | 30 days supply | Qty: 30 | Fill #1

## 2017-12-15 MED FILL — metFORMIN HCL 500 MG TABS: 500 | 30 days supply | Qty: 30 | Fill #2

## 2017-12-15 MED FILL — ATORVASTATIN 20 MG TABLET: 20 | 30 days supply | Qty: 30 | Fill #2

## 2017-12-15 MED FILL — traZODone HCL 50 MG TABS: 50 | 30 days supply | Qty: 30 | Fill #2

## 2018-01-19 MED FILL — traZODone HCL 50 MG TABS: 50 | 30 days supply | Qty: 30 | Fill #1

## 2018-01-19 MED FILL — ATORVASTATIN 20 MG TABLET: 20 | 30 days supply | Qty: 30 | Fill #3

## 2018-01-19 MED FILL — metFORMIN HCL 500 MG TABS: 500 | 30 days supply | Qty: 30 | Fill #3

## 2018-01-19 MED FILL — ?GLIPIZIDE 5MG TABLET: 5 | 30 days supply | Qty: 60 | Fill #0

## 2018-01-22 ENCOUNTER — Ambulatory Visit: Payer: Self-pay | Attending: Family Medicine

## 2018-02-22 ENCOUNTER — Other Ambulatory Visit: Payer: Self-pay | Admitting: Family Medicine

## 2018-02-22 DIAGNOSIS — E119 Type 2 diabetes mellitus without complications: Secondary | ICD-10-CM

## 2018-02-22 MED FILL — ?ATORVASTATIN 20 MG TABLET: 20 | 30 days supply | Qty: 30 | Fill #0

## 2018-02-25 MED FILL — ?GLIPIZIDE 5MG TABLET: 5 | 30 days supply | Qty: 60 | Fill #1

## 2018-03-15 ENCOUNTER — Ambulatory Visit: Payer: Medicaid Other | Admitting: Family Medicine

## 2018-03-24 MED FILL — ?ATORVASTATIN 20 MG TABLET: 20 | 30 days supply | Qty: 30 | Fill #1

## 2018-04-20 ENCOUNTER — Encounter: Payer: Self-pay | Admitting: Family Medicine

## 2018-04-20 ENCOUNTER — Ambulatory Visit: Payer: Self-pay | Attending: Family Medicine | Admitting: Family Medicine

## 2018-04-20 ENCOUNTER — Ambulatory Visit: Payer: Medicaid Other | Admitting: Family Medicine

## 2018-04-20 VITALS — BP 123/82 | HR 98 | Temp 98.2°F | Resp 18 | Ht 62.0 in | Wt 146.0 lb

## 2018-04-20 DIAGNOSIS — Z7982 Long term (current) use of aspirin: Secondary | ICD-10-CM | POA: Insufficient documentation

## 2018-04-20 DIAGNOSIS — Z79899 Other long term (current) drug therapy: Secondary | ICD-10-CM | POA: Insufficient documentation

## 2018-04-20 DIAGNOSIS — G4700 Insomnia, unspecified: Secondary | ICD-10-CM | POA: Insufficient documentation

## 2018-04-20 DIAGNOSIS — Z7984 Long term (current) use of oral hypoglycemic drugs: Secondary | ICD-10-CM | POA: Insufficient documentation

## 2018-04-20 DIAGNOSIS — E7800 Pure hypercholesterolemia, unspecified: Secondary | ICD-10-CM | POA: Insufficient documentation

## 2018-04-20 DIAGNOSIS — E119 Type 2 diabetes mellitus without complications: Secondary | ICD-10-CM | POA: Insufficient documentation

## 2018-04-20 DIAGNOSIS — Z1239 Encounter for other screening for malignant neoplasm of breast: Secondary | ICD-10-CM

## 2018-04-20 LAB — POCT GLYCOSYLATED HEMOGLOBIN (HGB A1C): HBA1C, POC (CONTROLLED DIABETIC RANGE): 8.7 % — AB (ref 0.0–7.0)

## 2018-04-20 LAB — GLUCOSE, POCT (MANUAL RESULT ENTRY): POC Glucose: 181 mg/dl — AB (ref 70–99)

## 2018-04-20 MED ORDER — TRAZODONE HCL 50 MG PO TABS: 50.0000 mg | ORAL_TABLET | Freq: Every day | ORAL | 6 refills | Status: DC

## 2018-04-20 MED ORDER — GLIPIZIDE 5 MG PO TABS: 5.0000 mg | ORAL_TABLET | Freq: Two times a day (BID) | ORAL | 6 refills | Status: DC

## 2018-04-20 MED ORDER — METFORMIN HCL 500 MG PO TABS: 500.0000 mg | ORAL_TABLET | Freq: Every day | ORAL | 6 refills | Status: DC

## 2018-04-20 MED ORDER — ATORVASTATIN CALCIUM 20 MG PO TABS: 20.0000 mg | ORAL_TABLET | Freq: Every day | ORAL | 6 refills | Status: DC

## 2018-04-20 MED FILL — ?GLIPIZIDE 5MG TABLET: 5 | 30 days supply | Qty: 60 | Fill #0

## 2018-04-20 MED FILL — ?TRAZODONE HCL 50MG TABS: 50 | 30 days supply | Qty: 30 | Fill #0

## 2018-04-20 MED FILL — ?ATORVASTATIN 20 MG TABLET: 20 | 30 days supply | Qty: 30 | Fill #0

## 2018-04-20 MED FILL — ?METFORMIN HCL 500MG TABL: 500 | 30 days supply | Qty: 30 | Fill #0

## 2018-04-20 NOTE — Patient Instructions (Signed)
Diabetes Mellitus and Nutrition When you have diabetes (diabetes mellitus), it is very important to have healthy eating habits because your blood sugar (glucose) levels are greatly affected by what you eat and drink. Eating healthy foods in the appropriate amounts, at about the same times every day, can help you:  Control your blood glucose.  Lower your risk of heart disease.  Improve your blood pressure.  Reach or maintain a healthy weight.  Every person with diabetes is different, and each person has different needs for a meal plan. Your health care provider may recommend that you work with a diet and nutrition specialist (dietitian) to make a meal plan that is best for you. Your meal plan may vary depending on factors such as:  The calories you need.  The medicines you take.  Your weight.  Your blood glucose, blood pressure, and cholesterol levels.  Your activity level.  Other health conditions you have, such as heart or kidney disease.  How do carbohydrates affect me? Carbohydrates affect your blood glucose level more than any other type of food. Eating carbohydrates naturally increases the amount of glucose in your blood. Carbohydrate counting is a method for keeping track of how many carbohydrates you eat. Counting carbohydrates is important to keep your blood glucose at a healthy level, especially if you use insulin or take certain oral diabetes medicines. It is important to know how many carbohydrates you can safely have in each meal. This is different for every person. Your dietitian can help you calculate how many carbohydrates you should have at each meal and for snack. Foods that contain carbohydrates include:  Bread, cereal, rice, pasta, and crackers.  Potatoes and corn.  Peas, beans, and lentils.  Milk and yogurt.  Fruit and juice.  Desserts, such as cakes, cookies, ice cream, and candy.  How does alcohol affect me? Alcohol can cause a sudden decrease in blood  glucose (hypoglycemia), especially if you use insulin or take certain oral diabetes medicines. Hypoglycemia can be a life-threatening condition. Symptoms of hypoglycemia (sleepiness, dizziness, and confusion) are similar to symptoms of having too much alcohol. If your health care provider says that alcohol is safe for you, follow these guidelines:  Limit alcohol intake to no more than 1 drink per day for nonpregnant women and 2 drinks per day for men. One drink equals 12 oz of beer, 5 oz of wine, or 1 oz of hard liquor.  Do not drink on an empty stomach.  Keep yourself hydrated with water, diet soda, or unsweetened iced tea.  Keep in mind that regular soda, juice, and other mixers may contain a lot of sugar and must be counted as carbohydrates.  What are tips for following this plan? Reading food labels  Start by checking the serving size on the label. The amount of calories, carbohydrates, fats, and other nutrients listed on the label are based on one serving of the food. Many foods contain more than one serving per package.  Check the total grams (g) of carbohydrates in one serving. You can calculate the number of servings of carbohydrates in one serving by dividing the total carbohydrates by 15. For example, if a food has 30 g of total carbohydrates, it would be equal to 2 servings of carbohydrates.  Check the number of grams (g) of saturated and trans fats in one serving. Choose foods that have low or no amount of these fats.  Check the number of milligrams (mg) of sodium in one serving. Most people   should limit total sodium intake to less than 2,300 mg per day.  Always check the nutrition information of foods labeled as "low-fat" or "nonfat". These foods may be higher in added sugar or refined carbohydrates and should be avoided.  Talk to your dietitian to identify your daily goals for nutrients listed on the label. Shopping  Avoid buying canned, premade, or processed foods. These  foods tend to be high in fat, sodium, and added sugar.  Shop around the outside edge of the grocery store. This includes fresh fruits and vegetables, bulk grains, fresh meats, and fresh dairy. Cooking  Use low-heat cooking methods, such as baking, instead of high-heat cooking methods like deep frying.  Cook using healthy oils, such as olive, canola, or sunflower oil.  Avoid cooking with butter, cream, or high-fat meats. Meal planning  Eat meals and snacks regularly, preferably at the same times every day. Avoid going long periods of time without eating.  Eat foods high in fiber, such as fresh fruits, vegetables, beans, and whole grains. Talk to your dietitian about how many servings of carbohydrates you can eat at each meal.  Eat 4-6 ounces of lean protein each day, such as lean meat, chicken, fish, eggs, or tofu. 1 ounce is equal to 1 ounce of meat, chicken, or fish, 1 egg, or 1/4 cup of tofu.  Eat some foods each day that contain healthy fats, such as avocado, nuts, seeds, and fish. Lifestyle   Check your blood glucose regularly.  Exercise at least 30 minutes 5 or more days each week, or as told by your health care provider.  Take medicines as told by your health care provider.  Do not use any products that contain nicotine or tobacco, such as cigarettes and e-cigarettes. If you need help quitting, ask your health care provider.  Work with a counselor or diabetes educator to identify strategies to manage stress and any emotional and social challenges. What are some questions to ask my health care provider?  Do I need to meet with a diabetes educator?  Do I need to meet with a dietitian?  What number can I call if I have questions?  When are the best times to check my blood glucose? Where to find more information:  American Diabetes Association: diabetes.org/food-and-fitness/food  Academy of Nutrition and Dietetics:  www.eatright.org/resources/health/diseases-and-conditions/diabetes  National Institute of Diabetes and Digestive and Kidney Diseases (NIH): www.niddk.nih.gov/health-information/diabetes/overview/diet-eating-physical-activity Summary  A healthy meal plan will help you control your blood glucose and maintain a healthy lifestyle.  Working with a diet and nutrition specialist (dietitian) can help you make a meal plan that is best for you.  Keep in mind that carbohydrates and alcohol have immediate effects on your blood glucose levels. It is important to count carbohydrates and to use alcohol carefully. This information is not intended to replace advice given to you by your health care provider. Make sure you discuss any questions you have with your health care provider. Document Released: 02/27/2005 Document Revised: 07/07/2016 Document Reviewed: 07/07/2016 Elsevier Interactive Patient Education  2018 Elsevier Inc.  

## 2018-04-20 NOTE — Progress Notes (Signed)
Subjective:  Patient ID: Meredith Ryan, female    DOB: Jun 08, 1961  Age: 57 y.o. MRN: [id number]  CC: Diabetes   HPI Meredith Ryan is a 57 year old female with a history of type 2 diabetes mellitus (A1c 8.7), hyperlipidemia who presents today for a follow-up visit accompanied by her husband. Her A1c is 8.7 and she has been taking metformin once daily but not glipizide which had been prescribed at her last visit.  Previously on twice daily dose of metformin which she has been unable to tolerate hence reduction in dose to once daily.  She also has not been compliant with atorvastatin as she had run out of refills. She denies hypoglycemia, numbness in extremities or visual concerns. For insomnia she is on trazodone which she also ran out of and has had a hard time sleeping. She has no additional concerns today. With regards to healthcare maintenance she is due for mammogram.   Past Medical History:  Diagnosis Date  . Anemia   . DM type 2 (diabetes mellitus, type 2) ([place])   . HLD (hyperlipidemia)     History reviewed. No pertinent surgical history.  No Known Allergies   Outpatient Medications Prior to Visit  Medication Sig Dispense Refill  . aspirin 81 MG tablet Take 1 tablet (81 mg total) by mouth daily. 90 tablet 0  . Multiple Vitamins-Minerals (MULTIVITAMIN WITH MINERALS) tablet Take 1 tablet by mouth daily. 30 tablet 2  . olopatadine (PATANOL) 0.1 % ophthalmic solution Place 1 drop into both eyes 2 (two) times daily. 5 mL 0  . atorvastatin (LIPITOR) 20 MG tablet Take 1 tablet (20 mg total) by mouth daily. 90 tablet 0  . glipiZIDE (GLUCOTROL) 5 MG tablet Take 1 tablet (5 mg total) by mouth 2 (two) times daily before a meal. 180 tablet 0  . metFORMIN (GLUCOPHAGE) 500 MG tablet Take 1 tablet (500 mg total) by mouth daily with breakfast. 90 tablet 0  . metFORMIN (GLUCOPHAGE) 500 MG tablet TAKE 1 TABLET (500 MG TOTAL) BY MOUTH DAILY WITH BREAKFAST. 30 tablet 3  . traZODone (DESYREL)  50 MG tablet Take 1 tablet (50 mg total) by mouth at bedtime. 90 tablet 0   No facility-administered medications prior to visit.     ROS Review of Systems  Constitutional: Negative for activity change, appetite change and fatigue.  HENT: Negative for congestion, sinus pressure and sore throat.   Eyes: Negative for visual disturbance.  Respiratory: Negative for cough, chest tightness, shortness of breath and wheezing.   Cardiovascular: Negative for chest pain and palpitations.  Gastrointestinal: Negative for abdominal distention, abdominal pain and constipation.  Endocrine: Negative for polydipsia.  Genitourinary: Negative for dysuria and frequency.  Musculoskeletal: Negative for arthralgias and back pain.  Skin: Negative for rash.  Neurological: Negative for tremors, light-headedness and numbness.  Hematological: Does not bruise/bleed easily.  Psychiatric/Behavioral: Negative for agitation and behavioral problems.    Objective:  BP 123/82 (BP Location: Right Arm, Patient Position: Sitting, Cuff Size: Normal)   Pulse 98   Temp 98.2 F (36.8 C) (Oral)   Resp 18   Ht _0  (1.575 m)   Wt 146 lb (66.2 kg)   SpO2 99%   BMI 26.70 kg/m   BP/Weight 04/20/2018 06/12/2017 0/24/0973  Systolic BP 532 992 426  Diastolic BP 82 85 82  Wt. (Lbs) 146 154 156.8  BMI 26.7 27.28 26.91      Physical Exam  Constitutional: She is oriented to person, place, and time. She appears well-developed  and well-nourished.  Neck: No JVD present.  Cardiovascular: Normal rate, normal heart sounds and intact distal pulses.  No murmur heard. Pulmonary/Chest: Effort normal and breath sounds normal. She has no wheezes. She has no rales. She exhibits no tenderness.  Abdominal: Soft. Bowel sounds are normal. She exhibits no distension and no mass. There is no tenderness.  Musculoskeletal: Normal range of motion.  Neurological: She is alert and oriented to person, place, and time.  Skin: Skin is warm and  dry.  Psychiatric: She has a normal mood and affect.    CMP Latest Ref Rng & Units 03/13/2017 07/21/2016 06/17/2016  Glucose 65 - 99 mg/dL 152(H) 113(H) 67  BUN 6 - 24 mg/dL _0 Creatinine 0.57 - 1.00 mg/dL 0.64 0.77 0.70  Sodium 134 - 144 mmol/L 139 137 137  Potassium 3.5 - 5.2 mmol/L 4.9 4.6 4.6  Chloride 96 - 106 mmol/L 99 100 98  CO2 20 - 29 mmol/L _1 Calcium 8.7 - 10.2 mg/dL 9.2 9.6 9.5  Total Protein 6.0 - 8.5 g/dL 7.3 7.7 7.7  Total Bilirubin 0.0 - 1.2 mg/dL 0.3 0.3 0.3  Alkaline Phos 39 - 117 IU/L 77 61 62  AST 0 - 40 IU/L _2 ALT 0 - 32 IU/L _3 Lipid Panel     Component Value Date/Time   CHOL 241 (H) 03/13/2017 1130   TRIG 133 03/13/2017 1130   HDL 52 03/13/2017 1130   CHOLHDL 4.6 (H) 03/13/2017 1130   CHOLHDL 3.1 10/01/2015 1505   VLDL 34 (H) 10/01/2015 1505   LDLCALC 162 (H) 03/13/2017 1130    Lab Results  Component Value Date   HGBA1C 8.7 (A) 04/20/2018    Assessment & Plan:   1. Type 2 diabetes mellitus without complication, without long-term current use of insulin (HCC) Uncontrolled with A1c of 8.7 She has not been taking glipizide and I have advised her to resume this Counseled on Diabetic diet, my plate method, 086 minutes of moderate intensity exercise/week Keep blood sugar logs with fasting goals of 80-120 mg/dl, random of less than 180 and in the event of sugars less than 60 mg/dl or greater than 400 mg/dl please notify the clinic ASAP. It is recommended that you undergo annual eye exams and annual foot exams. Pneumonia vaccine is recommended. - Glucose (CBG) - HgB A1c - metFORMIN (GLUCOPHAGE) 500 MG tablet; Take 1 tablet (500 mg total) by mouth daily with breakfast.  Dispense: 30 tablet; Refill: 6 - glipiZIDE (GLUCOTROL) 5 MG tablet; Take 1 tablet (5 mg total) by mouth 2 (two) times daily before a meal.  Dispense: 60 tablet; Refill: 6 - CMP14+EGFR; Future - Lipid panel; Future - Microalbumin/Creatinine Ratio, Urine;  Future  2. Insomnia, unspecified type Uncontrolled due to running out of medications which I refilled - traZODone (DESYREL) 50 MG tablet; Take 1 tablet (50 mg total) by mouth at bedtime.  Dispense: 30 tablet; Refill: 6  3. Pure hypercholesterolemia Uncontrolled Lipid panel; no regimen change if elevated as he has been noncompliant with Lipitor. Low-cholesterol diet - atorvastatin (LIPITOR) 20 MG tablet; Take 1 tablet (20 mg total) by mouth daily.  Dispense: 30 tablet; Refill: 6  4. Screening for breast cancer - MM Digital Screening; Future   Meds ordered this encounter  Medications  . traZODone (DESYREL) 50 MG tablet    Sig: Take 1 tablet (50 mg total) by mouth at bedtime.    Dispense:  30 tablet  Refill:  6  . metFORMIN (GLUCOPHAGE) 500 MG tablet    Sig: Take 1 tablet (500 mg total) by mouth daily with breakfast.    Dispense:  30 tablet    Refill:  6  . atorvastatin (LIPITOR) 20 MG tablet    Sig: Take 1 tablet (20 mg total) by mouth daily.    Dispense:  30 tablet    Refill:  6  . glipiZIDE (GLUCOTROL) 5 MG tablet    Sig: Take 1 tablet (5 mg total) by mouth 2 (two) times daily before a meal.    Dispense:  60 tablet    Refill:  6    Follow-up: Return in about 3 months (around 07/21/2018) for Follow-up of chronic medical conditions.   Charlott Rakes MD

## 2018-04-23 ENCOUNTER — Ambulatory Visit: Payer: Self-pay | Attending: Family Medicine

## 2018-04-23 DIAGNOSIS — E119 Type 2 diabetes mellitus without complications: Secondary | ICD-10-CM

## 2018-04-23 DIAGNOSIS — Z1239 Encounter for other screening for malignant neoplasm of breast: Secondary | ICD-10-CM

## 2018-04-24 LAB — CMP14+EGFR
ALBUMIN: 4.2 g/dL (ref 3.5–5.5)
ALT: 15 IU/L (ref 0–32)
AST: 19 IU/L (ref 0–40)
Albumin/Globulin Ratio: 1.5 (ref 1.2–2.2)
Alkaline Phosphatase: 85 IU/L (ref 39–117)
BUN / CREAT RATIO: 14 (ref 9–23)
BUN: 12 mg/dL (ref 6–24)
Bilirubin Total: 0.3 mg/dL (ref 0.0–1.2)
CO2: 23 mmol/L (ref 20–29)
Calcium: 9.3 mg/dL (ref 8.7–10.2)
Chloride: 100 mmol/L (ref 96–106)
Creatinine, Ser: 0.85 mg/dL (ref 0.57–1.00)
GFR, EST AFRICAN AMERICAN: 87 mL/min/{1.73_m2} (ref >59)
GFR, EST NON AFRICAN AMERICAN: 75 mL/min/{1.73_m2} (ref >59)
GLOBULIN, TOTAL: 2.8 g/dL (ref 1.5–4.5)
Glucose: 176 mg/dL — ABNORMAL HIGH (ref 65–99)
POTASSIUM: 4.6 mmol/L (ref 3.5–5.2)
SODIUM: 138 mmol/L (ref 134–144)
Total Protein: 7 g/dL (ref 6.0–8.5)

## 2018-04-24 LAB — LIPID PANEL
Chol/HDL Ratio: 3.2 ratio (ref 0.0–4.4)
Cholesterol, Total: 165 mg/dL (ref 100–199)
HDL: 51 mg/dL (ref >39)
LDL Calculated: 93 mg/dL (ref 0–99)
TRIGLYCERIDES: 103 mg/dL (ref 0–149)
VLDL CHOLESTEROL CAL: 21 mg/dL (ref 5–40)

## 2018-04-24 LAB — MICROALBUMIN / CREATININE URINE RATIO
Creatinine, Urine: 183.3 mg/dL
Microalb/Creat Ratio: 13.7 mg/g creat (ref 0.0–30.0)
Microalbumin, Urine: 25.1 ug/mL

## 2018-05-25 MED FILL — ?METFORMIN HCL 500MG TABLET: 500 | 30 days supply | Qty: 30 | Fill #1

## 2018-05-25 MED FILL — ?ATORVASTATIN 20 MG TABLET: 20 | 30 days supply | Qty: 30 | Fill #1

## 2018-05-25 MED FILL — ?TRAZODONE HCL 50MG TABS: 50 | 30 days supply | Qty: 30 | Fill #1

## 2018-06-24 MED FILL — ?METFORMIN HCL 500MG TABLET: 500 | 30 days supply | Qty: 30 | Fill #2

## 2018-07-07 MED FILL — ?GLIPIZIDE 5MG TABLET: 5 | 30 days supply | Qty: 60 | Fill #1

## 2018-07-07 MED FILL — ?ATORVASTATIN 20 MG TABLET: 20 | 30 days supply | Qty: 30 | Fill #2

## 2018-07-21 ENCOUNTER — Encounter: Payer: Self-pay | Admitting: Family Medicine

## 2018-07-21 ENCOUNTER — Ambulatory Visit: Payer: Self-pay | Attending: Family Medicine | Admitting: Family Medicine

## 2018-07-21 VITALS — BP 145/85 | HR 86 | Temp 98.1°F | Ht 62.0 in | Wt 143.2 lb

## 2018-07-21 DIAGNOSIS — D649 Anemia, unspecified: Secondary | ICD-10-CM | POA: Insufficient documentation

## 2018-07-21 DIAGNOSIS — Z7984 Long term (current) use of oral hypoglycemic drugs: Secondary | ICD-10-CM | POA: Insufficient documentation

## 2018-07-21 DIAGNOSIS — E119 Type 2 diabetes mellitus without complications: Secondary | ICD-10-CM | POA: Insufficient documentation

## 2018-07-21 DIAGNOSIS — Z1239 Encounter for other screening for malignant neoplasm of breast: Secondary | ICD-10-CM

## 2018-07-21 DIAGNOSIS — K0889 Other specified disorders of teeth and supporting structures: Secondary | ICD-10-CM | POA: Insufficient documentation

## 2018-07-21 DIAGNOSIS — K089 Disorder of teeth and supporting structures, unspecified: Secondary | ICD-10-CM

## 2018-07-21 DIAGNOSIS — R630 Anorexia: Secondary | ICD-10-CM | POA: Insufficient documentation

## 2018-07-21 DIAGNOSIS — G4709 Other insomnia: Secondary | ICD-10-CM | POA: Insufficient documentation

## 2018-07-21 DIAGNOSIS — Z79899 Other long term (current) drug therapy: Secondary | ICD-10-CM | POA: Insufficient documentation

## 2018-07-21 DIAGNOSIS — G8929 Other chronic pain: Secondary | ICD-10-CM | POA: Insufficient documentation

## 2018-07-21 DIAGNOSIS — E7800 Pure hypercholesterolemia, unspecified: Secondary | ICD-10-CM | POA: Insufficient documentation

## 2018-07-21 DIAGNOSIS — Z833 Family history of diabetes mellitus: Secondary | ICD-10-CM | POA: Insufficient documentation

## 2018-07-21 DIAGNOSIS — Z7982 Long term (current) use of aspirin: Secondary | ICD-10-CM | POA: Insufficient documentation

## 2018-07-21 DIAGNOSIS — E785 Hyperlipidemia, unspecified: Secondary | ICD-10-CM | POA: Insufficient documentation

## 2018-07-21 LAB — POCT GLYCOSYLATED HEMOGLOBIN (HGB A1C): HBA1C, POC (CONTROLLED DIABETIC RANGE): 7 % (ref 0.0–7.0)

## 2018-07-21 LAB — GLUCOSE, POCT (MANUAL RESULT ENTRY): POC Glucose: 132 mg/dl — AB (ref 70–99)

## 2018-07-21 MED ORDER — MIRTAZAPINE 15 MG PO TABS: 15.0000 mg | ORAL_TABLET | Freq: Every day | ORAL | 3 refills | Status: DC

## 2018-07-21 MED FILL — ?MIRTAZAPINE 15 MG TABLET: 15 | 30 days supply | Qty: 30 | Fill #0

## 2018-07-21 NOTE — Progress Notes (Signed)
Patient has no appetite and patient is not sleeping good.

## 2018-07-21 NOTE — Patient Instructions (Signed)

## 2018-07-21 NOTE — Progress Notes (Signed)
Subjective:  Patient ID: Meredith Ryan, female    DOB: May 13, 1961  Age: 58 y.o. MRN: [id number]  CC: Diabetes   HPI Meredith Ryan is a 58 year old female with a history of type 2 diabetes mellitus (A1c 7.0), hyperlipidemia who presents today for a follow-up visit accompanied by her husband. Her A1c is 7.0 which has improved from 8.7 previously and she endorses compliance with her medications, denies hypoglycemia, numbness in extremities or visual concerns She complains of insomnia which is uncontrolled on trazodone and also complains of reduced appetite.  Thyroid panel was normal last year.  Denies intake of caffeinated products. Also complains her lower teeth being more sensitive to extremes of temperature but denies pain in the absence of eating.  Symptoms have been present for 1 month.  Denies gum swelling or fever.  Past Medical History:  Diagnosis Date  . Anemia   . DM type 2 (diabetes mellitus, type 2) (HCC)   . HLD (hyperlipidemia)     History reviewed. No pertinent surgical history.  Family History  Problem Relation Age of Onset  . Diabetes Mother   . Diabetes Father     No Known Allergies  Outpatient Medications Prior to Visit  Medication Sig Dispense Refill  . aspirin 81 MG tablet Take 1 tablet (81 mg total) by mouth daily. 90 tablet 0  . atorvastatin (LIPITOR) 20 MG tablet Take 1 tablet (20 mg total) by mouth daily. 30 tablet 6  . glipiZIDE (GLUCOTROL) 5 MG tablet Take 1 tablet (5 mg total) by mouth 2 (two) times daily before a meal. 60 tablet 6  . metFORMIN (GLUCOPHAGE) 500 MG tablet Take 1 tablet (500 mg total) by mouth daily with breakfast. 30 tablet 6  . Multiple Vitamins-Minerals (MULTIVITAMIN WITH MINERALS) tablet Take 1 tablet by mouth daily. 30 tablet 2  . traZODone (DESYREL) 50 MG tablet Take 1 tablet (50 mg total) by mouth at bedtime. 30 tablet 6  . olopatadine (PATANOL) 0.1 % ophthalmic solution Place 1 drop into both eyes 2 (two) times daily. (Patient  not taking: Reported on 07/21/2018) 5 mL 0   No facility-administered medications prior to visit.      ROS Review of Systems General: negative for fever, weight loss, +appetite change Eyes: no visual symptoms. ENT: no ear symptoms, no sinus tenderness, no nasal congestion or sore throat, +dental problem Neck: no pain  Respiratory: no wheezing, shortness of breath, cough Cardiovascular: no chest pain, no dyspnea on exertion, no pedal edema, no orthopnea. Gastrointestinal: no abdominal pain, no diarrhea, no constipation Genito-Urinary: no urinary frequency, no dysuria, no polyuria. Hematologic: no bruising Endocrine: no cold or heat intolerance Neurological: no headaches, no seizures, no tremors Musculoskeletal: no joint pains, no joint swelling Skin: no pruritus, no rash. Psychological: no depression, no anxiety,    Objective:  BP (!) 145/85   Pulse 86   Temp 98.1 F (36.7 C) (Oral)   Ht 5\' 2"  (1.575 m)   Wt 143 lb 3.2 oz (65 kg)   SpO2 100%   BMI 26.19 kg/m   BP/Weight 07/21/2018 04/20/2018 06/12/2017  Systolic BP 145 123 131  Diastolic BP 85 82 85  Wt. (Lbs) 143.2 146 154  BMI 26.19 26.7 27.28      Physical Exam Constitutional: normal appearing,  Eyes: PERRLA HEENT: Head is atraumatic, normal sinuses, normal oropharynx, normal appearing tonsils and palate, tympanic membrane is normal bilaterally. Neck: normal range of motion, no thyromegaly, no JVD Cardiovascular: normal rate and rhythm, normal heart sounds, no  murmurs, rub or gallop, no pedal edema Respiratory: clear to auscultation bilaterally, no wheezes, no rales, no rhonchi Abdomen: soft, not tender to palpation, normal bowel sounds, no enlarged organs Extremities: Full ROM, no tenderness in joints Skin: warm and dry, no lesions. Neurological: alert, oriented x3, cranial nerves I-XII grossly intact , normal motor strength, normal sensation. Psychological: normal mood.   CMP Latest Ref Rng & Units 04/23/2018  03/13/2017 07/21/2016  Glucose 65 - 99 mg/dL 233(I) 356(Y) 616(O)  BUN 6 - 24 mg/dL 12 16 18   Creatinine 0.57 - 1.00 mg/dL 3.72 9.02 1.11  Sodium 134 - 144 mmol/L 138 139 137  Potassium 3.5 - 5.2 mmol/L 4.6 4.9 4.6  Chloride 96 - 106 mmol/L 100 99 100  CO2 20 - 29 mmol/L 23 26 30   Calcium 8.7 - 10.2 mg/dL 9.3 9.2 9.6  Total Protein 6.0 - 8.5 g/dL 7.0 7.3 7.7  Total Bilirubin 0.0 - 1.2 mg/dL 0.3 0.3 0.3  Alkaline Phos 39 - 117 IU/L 85 77 61  AST 0 - 40 IU/L 19 19 18   ALT 0 - 32 IU/L 15 12 12     Lipid Panel     Component Value Date/Time   CHOL 165 04/23/2018 1152   TRIG 103 04/23/2018 1152   HDL 51 04/23/2018 1152   CHOLHDL 3.2 04/23/2018 1152   CHOLHDL 3.1 10/01/2015 1505   VLDL 34 (H) 10/01/2015 1505   LDLCALC 93 04/23/2018 1152    CBC    Component Value Date/Time   WBC 7.4 07/21/2016 1606   RBC 5.27 (H) 07/21/2016 1606   HGB 12.9 07/21/2016 1606   HCT 40.5 07/21/2016 1606   PLT 356 07/21/2016 1606   MCV 76.9 (L) 07/21/2016 1606   MCH 24.5 (L) 07/21/2016 1606   MCHC 31.9 (L) 07/21/2016 1606   RDW 15.3 (H) 07/21/2016 1606   LYMPHSABS 3,108 07/21/2016 1606   MONOABS 518 07/21/2016 1606   EOSABS 222 07/21/2016 1606   BASOSABS 0 07/21/2016 1606    Lab Results  Component Value Date   HGBA1C 7.0 07/21/2018    Lab Results  Component Value Date   TSH 1.75 07/21/2016    Assessment & Ryan:   1. Type 2 diabetes mellitus without complication, without long-term current use of insulin (HCC) Controlled with A1c of 7.0 which has improved from 8.7 Discussed community resources for ophthalmology exams given she has no medical coverage Counseled on Diabetic diet, my plate method, 552 minutes of moderate intensity exercise/week Keep blood sugar logs with fasting goals of 80-120 mg/dl, random of less than 080 and in the event of sugars less than 60 mg/dl or greater than 223 mg/dl please notify the clinic ASAP. It is recommended that you undergo annual eye exams and annual foot  exams. Pneumonia vaccine is recommended. - POCT glucose (manual entry) - POCT glycosylated hemoglobin (Hb A1C)  2. Screening for breast cancer - MM Digital Screening; Future  3. Chronic dental pain No Indication for antibiotic at this time as there is no evidence of infection - Ambulatory referral to Dentistry  4. Pure hypercholesterolemia Continue Lipitor  5. Other insomnia Discontinue trazodone and commence Remeron  6. Poor appetite Hopefully Remeron will help with improving appetite   Meds ordered this encounter  Medications  . mirtazapine (REMERON) 15 MG tablet    Sig: Take 1 tablet (15 mg total) by mouth at bedtime.    Dispense:  30 tablet    Refill:  3    Discontinue trazodone  Follow-up: Return in about 3 months (around 10/19/2018) for Follow-up of chronic medical conditions.   Hoy RegisterEnobong Royanne Warshaw MD    Hoy RegisterEnobong Talib Headley, MD, FAAFP. [facility] [place] , [place] [phone]   07/21/2018, 4:49 PM

## 2018-07-23 MED FILL — ?METFORMIN HCL 500MG TABL: 500 | 30 days supply | Qty: 30 | Fill #3

## 2018-08-06 MED FILL — ?GLIPIZIDE 5MG TABLET: 5 | 30 days supply | Qty: 60 | Fill #2

## 2018-08-06 MED FILL — ?ATORVASTATIN 20 MG TABLET: 20 | 30 days supply | Qty: 30 | Fill #3

## 2018-08-23 MED FILL — ?METFORMIN HCL 500MG TABL: 500 | 30 days supply | Qty: 30 | Fill #4

## 2018-09-07 MED FILL — ?ATORVASTATIN 20 MG TABLET: 20 | 90 days supply | Qty: 90 | Fill #4

## 2018-09-20 MED FILL — ?METFORMIN HCL 500MG TABLET: 500 | 30 days supply | Qty: 30 | Fill #5

## 2018-10-04 MED FILL — ?GLIPIZIDE 5MG TABLET: 5 | 30 days supply | Qty: 60 | Fill #3

## 2018-10-19 ENCOUNTER — Ambulatory Visit: Payer: Self-pay | Attending: Family Medicine | Admitting: Family Medicine

## 2018-10-19 ENCOUNTER — Other Ambulatory Visit: Payer: Self-pay

## 2018-10-19 DIAGNOSIS — R630 Anorexia: Secondary | ICD-10-CM

## 2018-10-19 DIAGNOSIS — E7800 Pure hypercholesterolemia, unspecified: Secondary | ICD-10-CM

## 2018-10-19 DIAGNOSIS — E119 Type 2 diabetes mellitus without complications: Secondary | ICD-10-CM

## 2018-10-19 DIAGNOSIS — G4709 Other insomnia: Secondary | ICD-10-CM

## 2018-10-19 MED ORDER — MIRTAZAPINE 15 MG PO TABS: 15.0000 mg | ORAL_TABLET | Freq: Every day | ORAL | 1 refills | Status: DC

## 2018-10-19 MED ORDER — MULTI-VITAMIN/MINERALS PO TABS: 1.0000 | ORAL_TABLET | Freq: Every day | ORAL | 1 refills | Status: DC

## 2018-10-19 MED ORDER — GLIPIZIDE 5 MG PO TABS: 5.0000 mg | ORAL_TABLET | Freq: Two times a day (BID) | ORAL | 1 refills | Status: DC

## 2018-10-19 MED ORDER — METFORMIN HCL 500 MG PO TABS: 500.0000 mg | ORAL_TABLET | Freq: Every day | ORAL | 1 refills | Status: DC

## 2018-10-19 MED ORDER — ATORVASTATIN CALCIUM 20 MG PO TABS: 20.0000 mg | ORAL_TABLET | Freq: Every day | ORAL | 1 refills | Status: DC

## 2018-10-19 MED FILL — ?MIRTAZAPINE 15 MG TABLET: 15 | 90 days supply | Qty: 90 | Fill #0

## 2018-10-19 MED FILL — ?METFORMIN HCL 500MG TABLET: 500 | 90 days supply | Qty: 90 | Fill #0

## 2018-10-19 NOTE — Progress Notes (Signed)
Patient has been called and DOB has been verified. Patient has been screened and transferred to PCP to start phone visit.  Patient is having dental pain    

## 2018-10-19 NOTE — Progress Notes (Signed)
Virtual Visit via Telephone Note  I connected with Meredith Ryan, on 10/19/2018 at 4:14 PM by telephone due to the COVID-19 pandemic and verified that I am speaking with the correct person using two identifiers.   Consent: I discussed the limitations, risks, security and privacy concerns of performing an evaluation and management service by telephone and the availability of in person appointments. I also discussed with the patient that there may be a patient responsible charge related to this service. The patient expressed understanding and agreed to proceed.   Location of Patient: Home  Location of Provider: Clinic   Persons participating in Telemedicine visit: Shaindy Mcgeachy  Jamile Rekowski - husband Melba Coon Dr. Nelwyn Salisbury     History of Present Illness: Meredith Ryan is a 58 year old female with a history of type 2 diabetes mellitus (A1c 7.0), hyperlipidemia seen for follow-up visit today. She complains of insomnia and had been prescribed Remeron at her last visit which she never received from the pharmacy.  History is obtained majorly from her husband and some from the patient.  She endorses drinking coffee and promises to work on quitting. Her blood sugars have ranged from 95-135 and she denies hypoglycemia, numbness in extremities or visual concerns. She exercises around the house with her hospital regularly. Doing well on the statin with no complaints of myalgias. She has no additional concerns today.   Past Medical History:  Diagnosis Date  . Anemia   . DM type 2 (diabetes mellitus, type 2) (HCC)   . HLD (hyperlipidemia)    No Known Allergies  Current Outpatient Medications on File Prior to Visit  Medication Sig Dispense Refill  . aspirin 81 MG tablet Take 1 tablet (81 mg total) by mouth daily. 90 tablet 0  . atorvastatin (LIPITOR) 20 MG tablet Take 1 tablet (20 mg total) by mouth daily. 30 tablet 6  . glipiZIDE (GLUCOTROL) 5 MG tablet Take 1  tablet (5 mg total) by mouth 2 (two) times daily before a meal. 60 tablet 6  . metFORMIN (GLUCOPHAGE) 500 MG tablet Take 1 tablet (500 mg total) by mouth daily with breakfast. 30 tablet 6  . Multiple Vitamins-Minerals (MULTIVITAMIN WITH MINERALS) tablet Take 1 tablet by mouth daily. 30 tablet 2  . mirtazapine (REMERON) 15 MG tablet Take 1 tablet (15 mg total) by mouth at bedtime. (Patient not taking: Reported on 10/19/2018) 30 tablet 3  . olopatadine (PATANOL) 0.1 % ophthalmic solution Place 1 drop into both eyes 2 (two) times daily. (Patient not taking: Reported on 07/21/2018) 5 mL 0   No current facility-administered medications on file prior to visit.     Observations/Objective: Awake, alert, oriented x3 Not in acute distress  CMP Latest Ref Rng & Units 04/23/2018 03/13/2017 07/21/2016  Glucose 65 - 99 mg/dL 098(J) 191(Y) 782(N)  BUN 6 - 24 mg/dL Creatinine 0.57 - 1.00 mg/dL 5.62 1.30 8.65  Sodium 134 - 144 mmol/L 138 139 137  Potassium 3.5 - 5.2 mmol/L 4.6 4.9 4.6  Chloride 96 - 106 mmol/L 100 99 100  CO2 20 - 29 mmol/L Calcium 8.7 - 10.2 mg/dL 9.3 9.2 9.6  Total Protein 6.0 - 8.5 g/dL 7.0 7.3 7.7  Total Bilirubin 0.0 - 1.2 mg/dL 0.3 0.3 0.3  Alkaline Phos 39 - 117 IU/L 85 77 61  AST 0 - 40 IU/L ALT 0 - 32 IU/L Lipid Panel     Component  Value Date/Time   CHOL 165 04/23/2018 1152   TRIG 103 04/23/2018 1152   HDL 51 04/23/2018 1152   CHOLHDL 3.2 04/23/2018 1152   CHOLHDL 3.1 10/01/2015 1505   VLDL 34 (H) 10/01/2015 1505   LDLCALC 93 04/23/2018 1152    Lab Results  Component Value Date   HGBA1C 7.0 07/21/2018      Assessment and Plan: 1. Type 2 diabetes mellitus without complication, without long-term current use of insulin (HCC) Controlled with A1c of 7.0 Counseled on Diabetic diet, my plate method, 403 minutes of moderate intensity exercise/week Keep blood sugar logs with fasting goals of 80-120 mg/dl, random of less than 474  and in the event of sugars less than 60 mg/dl or greater than 259 mg/dl please notify the clinic ASAP. It is recommended that you undergo annual eye exams and annual foot exams. Pneumonia vaccine is recommended. - glipiZIDE (GLUCOTROL) 5 MG tablet; Take 1 tablet (5 mg total) by mouth 2 (two) times daily before a meal.  Dispense: 180 tablet; Refill: 1 - metFORMIN (GLUCOPHAGE) 500 MG tablet; Take 1 tablet (500 mg total) by mouth daily with breakfast.  Dispense: 90 tablet; Refill: 1  2. Pure hypercholesterolemia Controlled Low-cholesterol diet - atorvastatin (LIPITOR) 20 MG tablet; Take 1 tablet (20 mg total) by mouth daily.  Dispense: 90 tablet; Refill: 1  3. Other insomnia Uncontrolled Advised to cut out caffeine intake Refilled mirtazapine which she never received - mirtazapine (REMERON) 15 MG tablet; Take 1 tablet (15 mg total) by mouth at bedtime.  Dispense: 90 tablet; Refill: 1  4. Poor appetite - mirtazapine (REMERON) 15 MG tablet; Take 1 tablet (15 mg total) by mouth at bedtime.  Dispense: 90 tablet; Refill: 1   Follow Up Instructions: Return in about 3 months (around 01/19/2019).    I discussed the assessment and treatment plan with the patient. The patient was provided an opportunity to ask questions and all were answered. The patient agreed with the plan and demonstrated an understanding of the instructions.   The patient was advised to call back or seek an in-person evaluation if the symptoms worsen or if the condition fails to improve as anticipated.     I provided 15 minutes total of non-face-to-face time during this encounter including median intraservice time, reviewing previous notes, labs, imaging, medications and explaining diagnosis and management.     Hoy Register, MD, FAAFP. [facility] [place], [place] [phone]   10/19/2018, 4:14 PM

## 2018-11-09 MED FILL — ?GLIPIZIDE 5MG TABLET: 5 | 30 days supply | Qty: 60 | Fill #4

## 2018-12-10 MED FILL — ?ATORVASTATIN 20 MG TABLET: 20 | 90 days supply | Qty: 90 | Fill #0

## 2018-12-16 MED FILL — ?GLIPIZIDE 5MG TABLET: 5 | 30 days supply | Qty: 60 | Fill #5

## 2019-01-20 MED FILL — ?METFORMIN HCL 500MG TABLET: 500 | 90 days supply | Qty: 90 | Fill #1

## 2019-01-20 MED FILL — ?GLIPIZIDE 5MG TABLET: 5 | 30 days supply | Qty: 60 | Fill #6

## 2019-03-23 MED FILL — glipiZIDE 5 MG TABS: 5 | 30 days supply | Qty: 60 | Fill #0

## 2019-03-23 MED FILL — ATORVASTATIN CALCIUM 20 MG: 20 | 90 days supply | Qty: 90 | Fill #1

## 2019-03-28 MED FILL — metFORMIN HCL 500 MG TABS: 500 | 30 days supply | Qty: 30 | Fill #6

## 2019-04-01 ENCOUNTER — Other Ambulatory Visit: Payer: Self-pay

## 2019-04-01 ENCOUNTER — Ambulatory Visit: Payer: Medicaid Other | Attending: Family Medicine

## 2019-04-04 ENCOUNTER — Ambulatory Visit: Payer: Self-pay | Attending: Family Medicine | Admitting: Family Medicine

## 2019-04-04 ENCOUNTER — Other Ambulatory Visit: Payer: Self-pay

## 2019-04-04 ENCOUNTER — Encounter: Payer: Self-pay | Admitting: Family Medicine

## 2019-04-04 VITALS — BP 130/84 | HR 94 | Temp 98.2°F | Ht 62.0 in | Wt 148.2 lb

## 2019-04-04 DIAGNOSIS — E7800 Pure hypercholesterolemia, unspecified: Secondary | ICD-10-CM

## 2019-04-04 DIAGNOSIS — R630 Anorexia: Secondary | ICD-10-CM

## 2019-04-04 DIAGNOSIS — E1165 Type 2 diabetes mellitus with hyperglycemia: Secondary | ICD-10-CM

## 2019-04-04 DIAGNOSIS — R413 Other amnesia: Secondary | ICD-10-CM

## 2019-04-04 DIAGNOSIS — G4709 Other insomnia: Secondary | ICD-10-CM

## 2019-04-04 LAB — POCT GLYCOSYLATED HEMOGLOBIN (HGB A1C): HbA1c, POC (controlled diabetic range): 8.4 % — AB (ref 0.0–7.0)

## 2019-04-04 LAB — GLUCOSE, POCT (MANUAL RESULT ENTRY): POC Glucose: 157 mg/dl — AB (ref 70–99)

## 2019-04-04 MED ORDER — METFORMIN HCL 500 MG PO TABS: 500.0000 mg | ORAL_TABLET | Freq: Two times a day (BID) | ORAL | 1 refills | Status: DC

## 2019-04-04 MED ORDER — ATORVASTATIN CALCIUM 20 MG PO TABS: 20.0000 mg | ORAL_TABLET | Freq: Every day | ORAL | 1 refills | Status: DC

## 2019-04-04 MED ORDER — MIRTAZAPINE 15 MG PO TABS: 15.0000 mg | ORAL_TABLET | Freq: Every day | ORAL | 1 refills | Status: DC

## 2019-04-04 MED ORDER — GLIPIZIDE 5 MG PO TABS: 5.0000 mg | ORAL_TABLET | Freq: Two times a day (BID) | ORAL | 1 refills | Status: DC

## 2019-04-04 MED FILL — metFORMIN HCL 500 MG TABS: 500 | 30 days supply | Qty: 60 | Fill #0

## 2019-04-04 NOTE — Patient Instructions (Signed)

## 2019-04-04 NOTE — Progress Notes (Signed)
Subjective:  Patient ID: Meredith Ryan, female    DOB: Jul 13, 1960  Age: 58 y.o. MRN: [id number]  CC: Diabetes   HPI Meredith Ryan is a 58 year old female with a history of type 2 diabetes mellitus (A1c 8.4), hyperlipidemia seen for follow-up visit today accompanied by her spouse Her A1c is 8.4 which is up from 7.0 previously and she endorses compliance with her medications and exercises mostly around the house.  She denies neuropathy, visual concerns or hypoglycemic symptoms. Compliant with his statin and denies adverse effects from medication.  Her husband complains he has noticed memory loss and the patient as she misplaces things and does not recall where she put them.  In one instance she had given him some money to keep but had subsequently forgotten.  She has not forgotten her account numbers and does not leave the stove on; she does not forget her way home.  Her spouse is concerned given her mother had a history of Alzheimer's. The insomnia she complained of at her last visit has resolved ever since she quit taking caffeine late in the day. Past Medical History:  Diagnosis Date  . Anemia   . DM type 2 (diabetes mellitus, type 2) ([place])   . HLD (hyperlipidemia)     History reviewed. No pertinent surgical history.  Family History  Problem Relation Age of Onset  . Diabetes Mother   . Diabetes Father     No Known Allergies  Outpatient Medications Prior to Visit  Medication Sig Dispense Refill  . aspirin 81 MG tablet Take 1 tablet (81 mg total) by mouth daily. 90 tablet 0  . mirtazapine (REMERON) 15 MG tablet Take 1 tablet (15 mg total) by mouth at bedtime. 90 tablet 1  . Multiple Vitamins-Minerals (MULTIVITAMIN WITH MINERALS) tablet Take 1 tablet by mouth daily. 90 tablet 1  . atorvastatin (LIPITOR) 20 MG tablet Take 1 tablet (20 mg total) by mouth daily. 90 tablet 1  . glipiZIDE (GLUCOTROL) 5 MG tablet Take 1 tablet (5 mg total) by mouth 2 (two) times daily before a meal.  180 tablet 1  . metFORMIN (GLUCOPHAGE) 500 MG tablet Take 1 tablet (500 mg total) by mouth daily with breakfast. 90 tablet 1  . olopatadine (PATANOL) 0.1 % ophthalmic solution Place 1 drop into both eyes 2 (two) times daily. (Patient not taking: Reported on 07/21/2018) 5 mL 0   No facility-administered medications prior to visit.      ROS Review of Systems  Constitutional: Negative for activity change, appetite change and fatigue.  HENT: Negative for congestion, sinus pressure and sore throat.   Eyes: Negative for visual disturbance.  Respiratory: Negative for cough, chest tightness, shortness of breath and wheezing.   Cardiovascular: Negative for chest pain and palpitations.  Gastrointestinal: Negative for abdominal distention, abdominal pain and constipation.  Endocrine: Negative for polydipsia.  Genitourinary: Negative for dysuria and frequency.  Musculoskeletal: Negative for arthralgias and back pain.  Skin: Negative for rash.  Neurological: Negative for tremors, light-headedness and numbness.  Hematological: Does not bruise/bleed easily.  Psychiatric/Behavioral: Negative for agitation and behavioral problems.    Objective:  BP 130/84   Pulse 94   Temp 98.2 F (36.8 C) (Oral)   Ht 5' 2"  (1.575 m)   Wt 148 lb 3.2 oz (67.2 kg)   SpO2 98%   BMI 27.11 kg/m   BP/Weight 04/04/2019 07/21/2018 56/09/3327  Systolic BP 518 841 660  Diastolic BP 84 85 82  Wt. (Lbs) 148.2 143.2 146  BMI  27.11 26.19 26.7      Physical Exam Constitutional:      Appearance: She is well-developed.  Neck:     Vascular: No JVD.  Cardiovascular:     Rate and Rhythm: Normal rate.     Heart sounds: Normal heart sounds. No murmur.  Pulmonary:     Effort: Pulmonary effort is normal.     Breath sounds: Normal breath sounds. No wheezing or rales.  Chest:     Chest wall: No tenderness.  Abdominal:     General: Bowel sounds are normal. There is no distension.     Palpations: Abdomen is soft. There is  no mass.     Tenderness: There is no abdominal tenderness.  Musculoskeletal: Normal range of motion.     Right lower leg: No edema.     Left lower leg: No edema.  Neurological:     Mental Status: She is alert and oriented to person, place, and time.  Psychiatric:        Mood and Affect: Mood normal.     CMP Latest Ref Rng & Units 04/23/2018 03/13/2017 07/21/2016  Glucose 65 - 99 mg/dL 176(H) 152(H) 113(H)  BUN 6 - 24 mg/dL 12 16 18   Creatinine 0.57 - 1.00 mg/dL 0.85 0.64 0.77  Sodium 134 - 144 mmol/L 138 139 137  Potassium 3.5 - 5.2 mmol/L 4.6 4.9 4.6  Chloride 96 - 106 mmol/L 100 99 100  CO2 20 - 29 mmol/L 23 26 30   Calcium 8.7 - 10.2 mg/dL 9.3 9.2 9.6  Total Protein 6.0 - 8.5 g/dL 7.0 7.3 7.7  Total Bilirubin 0.0 - 1.2 mg/dL 0.3 0.3 0.3  Alkaline Phos 39 - 117 IU/L 85 77 61  AST 0 - 40 IU/L 19 19 18   ALT 0 - 32 IU/L 15 12 12     Lipid Panel     Component Value Date/Time   CHOL 165 04/23/2018 1152   TRIG 103 04/23/2018 1152   HDL 51 04/23/2018 1152   CHOLHDL 3.2 04/23/2018 1152   CHOLHDL 3.1 10/01/2015 1505   VLDL 34 (H) 10/01/2015 1505   LDLCALC 93 04/23/2018 1152    CBC    Component Value Date/Time   WBC 7.4 07/21/2016 1606   RBC 5.27 (H) 07/21/2016 1606   HGB 12.9 07/21/2016 1606   HCT 40.5 07/21/2016 1606   PLT 356 07/21/2016 1606   MCV 76.9 (L) 07/21/2016 1606   MCH 24.5 (L) 07/21/2016 1606   MCHC 31.9 (L) 07/21/2016 1606   RDW 15.3 (H) 07/21/2016 1606   LYMPHSABS 3,108 07/21/2016 1606   MONOABS 518 07/21/2016 1606   EOSABS 222 07/21/2016 1606   BASOSABS 0 07/21/2016 1606    Lab Results  Component Value Date   HGBA1C 8.4 (A) 04/04/2019    MMSE - Mini Mental State Exam 04/04/2019  Orientation to time 2  Orientation to Place 3  Registration 2  Attention/ Calculation 0  Recall 1  Language- name 2 objects 2  Language- repeat 1  Language- follow 3 step command 3  Language- read & follow direction 0  Write a sentence 0  Copy design 1  Total score  15     Assessment & Plan:   1. Type 2 diabetes mellitus with hyperglycemia, without long-term current use of insulin (HCC) Uncontrolled with A1c of 8.4 which has trended up from 7.0 previously Increase Metformin dose from once daily to twice daily dosing Reassess at next visit and adjust regimen if indicated Counseled on Diabetic  diet, my plate method, 471 minutes of moderate intensity exercise/week Blood sugar logs with fasting goals of 80-120 mg/dl, random of less than 180 and in the event of sugars less than 60 mg/dl or greater than 400 mg/dl encouraged to notify the clinic. Advised on the need for annual eye exams, annual foot exams, Pneumonia vaccine. - POCT glucose (manual entry) - POCT glycosylated hemoglobin (Hb A1C) - CMP14+EGFR - CBC with Differential/Platelet - Microalbumin/Creatinine Ratio, Urine - glipiZIDE (GLUCOTROL) 5 MG tablet; Take 1 tablet (5 mg total) by mouth 2 (two) times daily before a meal.  Dispense: 180 tablet; Refill: 1 - metFORMIN (GLUCOPHAGE) 500 MG tablet; Take 1 tablet (500 mg total) by mouth 2 (two) times daily with a meal.  Dispense: 180 tablet; Refill: 1  2. Memory loss Mini-Mental state exam is surprisingly low at 15 I am unsure if this is due to [place] as a second language or actually memory problems Her husband was also interfering during the exam and assisting her with answers We will schedule for a virtual visit to repeat Mini-Mental state exam in about 2 weeks prior to her scheduled 55-monthvisit - T4, free - TSH  3. Pure hypercholesterolemia Controlled Low-cholesterol diet - atorvastatin (LIPITOR) 20 MG tablet; Take 1 tablet (20 mg total) by mouth daily.  Dispense: 90 tablet; Refill: 1  4.  Insomnia Improved Continue Remeron  Meds ordered this encounter  Medications  . atorvastatin (LIPITOR) 20 MG tablet    Sig: Take 1 tablet (20 mg total) by mouth daily.    Dispense:  90 tablet    Refill:  1  . glipiZIDE (GLUCOTROL) 5 MG tablet     Sig: Take 1 tablet (5 mg total) by mouth 2 (two) times daily before a meal.    Dispense:  180 tablet    Refill:  1  . metFORMIN (GLUCOPHAGE) 500 MG tablet    Sig: Take 1 tablet (500 mg total) by mouth 2 (two) times daily with a meal.    Dispense:  180 tablet    Refill:  1    Dose increase    Follow-up: Return in about 3 months (around 07/05/2019) for medical conditions - in person.       ECharlott Rakes MD, FAAFP. [facility]  04/04/2019, 3:46 PM

## 2019-04-05 LAB — CBC WITH DIFFERENTIAL/PLATELET
Basophils Absolute: 0.1 10*3/uL (ref 0.0–0.2)
Basos: 1 %
EOS (ABSOLUTE): 0.3 10*3/uL (ref 0.0–0.4)
Eos: 3 %
Hematocrit: 41 % (ref 34.0–46.6)
Hemoglobin: 13.5 g/dL (ref 11.1–15.9)
Immature Grans (Abs): 0 10*3/uL (ref 0.0–0.1)
Immature Granulocytes: 0 %
Lymphocytes Absolute: 3.7 10*3/uL — ABNORMAL HIGH (ref 0.7–3.1)
Lymphs: 43 %
MCH: 25 pg — ABNORMAL LOW (ref 26.6–33.0)
MCHC: 32.9 g/dL (ref 31.5–35.7)
MCV: 76 fL — ABNORMAL LOW (ref 79–97)
Monocytes Absolute: 0.6 10*3/uL (ref 0.1–0.9)
Monocytes: 7 %
Neutrophils Absolute: 4 10*3/uL (ref 1.4–7.0)
Neutrophils: 46 %
Platelets: 367 10*3/uL (ref 150–450)
RBC: 5.39 x10E6/uL — ABNORMAL HIGH (ref 3.77–5.28)
RDW: 14.7 % (ref 11.7–15.4)
WBC: 8.7 10*3/uL (ref 3.4–10.8)

## 2019-04-05 LAB — CMP14+EGFR
ALT: 17 IU/L (ref 0–32)
AST: 25 IU/L (ref 0–40)
Albumin/Globulin Ratio: 1.3 (ref 1.2–2.2)
Albumin: 4.4 g/dL (ref 3.8–4.9)
Alkaline Phosphatase: 100 IU/L (ref 39–117)
BUN/Creatinine Ratio: 14 (ref 12–28)
BUN: 10 mg/dL (ref 8–27)
Bilirubin Total: 0.2 mg/dL (ref 0.0–1.2)
CO2: 22 mmol/L (ref 20–29)
Calcium: 9.7 mg/dL (ref 8.7–10.3)
Chloride: 99 mmol/L (ref 96–106)
Creatinine, Ser: 0.73 mg/dL (ref 0.57–1.00)
GFR calc Af Amer: 104 mL/min/{1.73_m2} (ref >59)
GFR calc non Af Amer: 90 mL/min/{1.73_m2} (ref >59)
Globulin, Total: 3.5 g/dL (ref 1.5–4.5)
Glucose: 137 mg/dL — ABNORMAL HIGH (ref 65–99)
Potassium: 4.8 mmol/L (ref 3.5–5.2)
Sodium: 138 mmol/L (ref 134–144)
Total Protein: 7.9 g/dL (ref 6.0–8.5)

## 2019-04-05 LAB — MICROALBUMIN / CREATININE URINE RATIO
Creatinine, Urine: 25.4 mg/dL
Microalb/Creat Ratio: 13 mg/g creat (ref 0–29)
Microalbumin, Urine: 3.2 ug/mL

## 2019-04-05 LAB — T4, FREE: Free T4: 1.56 ng/dL (ref 0.82–1.77)

## 2019-04-05 LAB — TSH: TSH: 2.39 u[IU]/mL (ref 0.450–4.500)

## 2019-04-05 MED FILL — ?MIRTAZAPINE 15 MG TABLET: 15 | 30 days supply | Qty: 30 | Fill #0

## 2019-04-08 ENCOUNTER — Telehealth: Payer: Self-pay

## 2019-04-08 NOTE — Telephone Encounter (Signed)
-----   Message from Charlott Rakes, MD sent at 04/05/2019  6:12 PM EDT ----- Please inform her labs are normal.  Can you please schedule a virtual visit for her in 2 weeks so I can follow-up on her memory loss?  Thank you

## 2019-04-08 NOTE — Telephone Encounter (Signed)
Patient was called and a voicemail was left informing patient to return phone call for lab results. 

## 2019-05-04 MED FILL — ?GLIPIZIDE 5MG TABLET: 5 | 30 days supply | Qty: 60 | Fill #1

## 2019-06-06 MED FILL — metFORMIN HCL 500 MG TABS: 500 | 30 days supply | Qty: 60 | Fill #1

## 2019-06-13 MED FILL — glipiZIDE 5 MG TABS: 5 | 30 days supply | Qty: 60 | Fill #2

## 2019-06-15 ENCOUNTER — Ambulatory Visit: Payer: Medicaid Other | Admitting: Family Medicine

## 2019-06-22 MED FILL — ?ATORVASTATIN 20 MG TABLET: 20 | 30 days supply | Qty: 30 | Fill #0

## 2019-07-05 ENCOUNTER — Ambulatory Visit: Payer: Self-pay | Admitting: Family Medicine

## 2019-07-05 ENCOUNTER — Ambulatory Visit: Payer: Medicaid Other | Admitting: Family Medicine

## 2019-07-18 ENCOUNTER — Encounter: Payer: Self-pay | Admitting: Family Medicine

## 2019-07-18 ENCOUNTER — Ambulatory Visit: Payer: Self-pay | Admitting: Family Medicine

## 2019-07-18 ENCOUNTER — Other Ambulatory Visit: Payer: Self-pay

## 2019-07-18 ENCOUNTER — Ambulatory Visit: Payer: Self-pay | Attending: Family Medicine | Admitting: Family Medicine

## 2019-07-18 VITALS — BP 112/72 | HR 91 | Temp 98.7°F | Resp 18 | Ht 65.0 in | Wt 140.0 lb

## 2019-07-18 DIAGNOSIS — E1165 Type 2 diabetes mellitus with hyperglycemia: Secondary | ICD-10-CM

## 2019-07-18 DIAGNOSIS — R630 Anorexia: Secondary | ICD-10-CM

## 2019-07-18 DIAGNOSIS — E7800 Pure hypercholesterolemia, unspecified: Secondary | ICD-10-CM

## 2019-07-18 DIAGNOSIS — G4709 Other insomnia: Secondary | ICD-10-CM

## 2019-07-18 LAB — POCT GLYCOSYLATED HEMOGLOBIN (HGB A1C): Hemoglobin A1C: 7.3 % — AB (ref 4.0–5.6)

## 2019-07-18 LAB — GLUCOSE, POCT (MANUAL RESULT ENTRY): POC Glucose: 195 mg/dl — AB (ref 70–99)

## 2019-07-18 MED ORDER — GLIPIZIDE 5 MG PO TABS: 5.0000 mg | ORAL_TABLET | Freq: Two times a day (BID) | ORAL | 1 refills | Status: DC

## 2019-07-18 MED ORDER — MIRTAZAPINE 15 MG PO TABS: 15.0000 mg | ORAL_TABLET | Freq: Every day | ORAL | 1 refills | Status: DC

## 2019-07-18 MED ORDER — METFORMIN HCL 500 MG PO TABS: 500.0000 mg | ORAL_TABLET | Freq: Two times a day (BID) | ORAL | 1 refills | Status: DC

## 2019-07-18 MED ORDER — ATORVASTATIN CALCIUM 20 MG PO TABS: 20.0000 mg | ORAL_TABLET | Freq: Every day | ORAL | 1 refills | Status: DC

## 2019-07-18 MED FILL — ?METFORMIN HCL 500MG TABLET: 500 | 30 days supply | Qty: 60 | Fill #0

## 2019-07-18 MED FILL — ?GLIPIZIDE 5MG TABLET: 5 | 30 days supply | Qty: 60 | Fill #0

## 2019-07-18 MED FILL — ?ATORVASTATIN 20 MG TABLET: 20 | 30 days supply | Qty: 30 | Fill #0

## 2019-07-18 MED FILL — ?MIRTAZAPINE 15 MG TABLET: 15 | 30 days supply | Qty: 30 | Fill #0

## 2019-07-18 NOTE — Progress Notes (Signed)
Patient needs refills on medication. 

## 2019-07-18 NOTE — Progress Notes (Signed)
Subjective:  Patient ID: Meredith Ryan, female    DOB: 1961-05-21  Age: 59 y.o. MRN: [id number]  CC: Diabetes   HPI Meredith Ryan  is a 59 year old female with a history of type 2 diabetes mellitus (A1c 7.3), hyperlipidemiaseen for follow-up visit today accompanied by her spouse She denies hypoglycemic episodes, visual abnormalities or numbness in extremities and endorses compliance with her medications. Compliant with her Statin and denies adverse effects. Insomnia is controlled on her current regimen.  Past Medical History:  Diagnosis Date  . Anemia   . DM type 2 (diabetes mellitus, type 2) (HCC)   . HLD (hyperlipidemia)     History reviewed. No pertinent surgical history.  Family History  Problem Relation Age of Onset  . Diabetes Mother   . Diabetes Father     No Known Allergies  Outpatient Medications Prior to Visit  Medication Sig Dispense Refill  . aspirin 81 MG tablet Take 1 tablet (81 mg total) by mouth daily. 90 tablet 0  . atorvastatin (LIPITOR) 20 MG tablet Take 1 tablet (20 mg total) by mouth daily. 90 tablet 1  . glipiZIDE (GLUCOTROL) 5 MG tablet Take 1 tablet (5 mg total) by mouth 2 (two) times daily before a meal. 180 tablet 1  . metFORMIN (GLUCOPHAGE) 500 MG tablet Take 1 tablet (500 mg total) by mouth 2 (two) times daily with a meal. 180 tablet 1  . mirtazapine (REMERON) 15 MG tablet Take 1 tablet (15 mg total) by mouth at bedtime. 90 tablet 1  . Multiple Vitamins-Minerals (MULTIVITAMIN WITH MINERALS) tablet Take 1 tablet by mouth daily. 90 tablet 1  . olopatadine (PATANOL) 0.1 % ophthalmic solution Place 1 drop into both eyes 2 (two) times daily. 5 mL 0   No facility-administered medications prior to visit.     ROS Review of Systems  Constitutional: Negative for activity change, appetite change and fatigue.  HENT: Negative for congestion, sinus pressure and sore throat.   Eyes: Negative for visual disturbance.  Respiratory: Negative for cough,  chest tightness, shortness of breath and wheezing.   Cardiovascular: Negative for chest pain and palpitations.  Gastrointestinal: Negative for abdominal distention, abdominal pain and constipation.  Endocrine: Negative for polydipsia.  Genitourinary: Negative for dysuria and frequency.  Musculoskeletal: Negative for arthralgias and back pain.  Skin: Negative for rash.  Neurological: Negative for tremors, light-headedness and numbness.  Hematological: Does not bruise/bleed easily.  Psychiatric/Behavioral: Negative for agitation and behavioral problems.    Objective:  BP 112/72 (BP Location: Right Arm, Patient Position: Sitting, Cuff Size: Normal)   Pulse 91   Temp 98.7 F (37.1 C) (Oral)   Resp 18   Ht 5\' 5"  (1.651 m)   Wt 140 lb (63.5 kg)   SpO2 97%   BMI 23.30 kg/m   BP/Weight 07/18/2019 04/04/2019 07/21/2018  Systolic BP 112 130 145  Diastolic BP 72 84 85  Wt. (Lbs) 140 148.2 143.2  BMI 23.3 27.11 26.19      Physical Exam Constitutional:      Appearance: She is well-developed.  Neck:     Vascular: No JVD.  Cardiovascular:     Rate and Rhythm: Normal rate.     Heart sounds: Normal heart sounds. No murmur.  Pulmonary:     Effort: Pulmonary effort is normal.     Breath sounds: Normal breath sounds. No wheezing or rales.  Chest:     Chest wall: No tenderness.  Abdominal:     General: Bowel sounds are normal. There  is no distension.     Palpations: Abdomen is soft. There is no mass.     Tenderness: There is no abdominal tenderness.  Musculoskeletal:        General: Normal range of motion.     Right lower leg: No edema.     Left lower leg: No edema.  Neurological:     Mental Status: She is alert and oriented to person, place, and time.  Psychiatric:        Mood and Affect: Mood normal.     CMP Latest Ref Rng & Units 04/04/2019 04/23/2018 03/13/2017  Glucose 65 - 99 mg/dL 782(U) 235(T) 614(E)  BUN 8 - 27 mg/dL 10 12 16   Creatinine 0.57 - 1.00 mg/dL 3.15 4.00    Sodium 134 - 144 mmol/L 138 138 139  Potassium 3.5 - 5.2 mmol/L 4.8 4.6 4.9  Chloride 96 - 106 mmol/L 99 100 99  CO2 20 - 29 mmol/L 22 23 26   Calcium 8.7 - 10.3 mg/dL 9.7 9.3 9.2  Total Protein 6.0 - 8.5 g/dL 7.9 7.0 7.3  Total Bilirubin 0.0 - 1.2 mg/dL 0.2 0.3 0.3  Alkaline Phos 39 - 117 IU/L 100 85 77  AST 0 - 40 IU/L 25 19 19   ALT 0 - 32 IU/L 17 15 12     Lipid Panel     Component Value Date/Time   CHOL 165 04/23/2018 1152   TRIG 103 04/23/2018 1152   HDL 51 04/23/2018 1152   CHOLHDL 3.2 04/23/2018 1152   CHOLHDL 3.1 10/01/2015 1505   VLDL 34 (H) 10/01/2015 1505   LDLCALC 93 04/23/2018 1152    CBC    Component Value Date/Time   WBC 8.7 04/04/2019 1538   WBC 7.4 07/21/2016 1606   RBC 5.39 (H) 04/04/2019 1538   RBC 5.27 (H) 07/21/2016 1606   HGB 13.5 04/04/2019 1538   HCT 41.0 04/04/2019 1538   PLT 367 04/04/2019 1538   MCV 76 (L) 04/04/2019 1538   MCH 25.0 (L) 04/04/2019 1538   MCH 24.5 (L) 07/21/2016 1606   MCHC 32.9 04/04/2019 1538   MCHC 31.9 (L) 07/21/2016 1606   RDW 14.7 04/04/2019 1538   LYMPHSABS 3.7 (H) 04/04/2019 1538   MONOABS 518 07/21/2016 1606   EOSABS 0.3 04/04/2019 1538   BASOSABS 0.1 04/04/2019 1538    Lab Results  Component Value Date   HGBA1C 7.3 (A) 07/18/2019    Assessment & Ryan:  1. Type 2 diabetes mellitus with hyperglycemia, without long-term current use of insulin (HCC) Controlled with A1c of 7.3; goal is <7.0 Continue current management Counseled on Diabetic diet, my plate method, 09/18/2016 minutes of moderate intensity exercise/week Blood sugar logs with fasting goals of 80-120 mg/dl, random of less than 04/06/2019 and in the event of sugars less than 60 mg/dl or greater than 04/06/2019 mg/dl encouraged to notify the clinic. Advised on the need for annual eye exams, annual foot exams, Pneumonia vaccine. - POCT A1C - Glucose (CBG) - metFORMIN (GLUCOPHAGE) 500 MG tablet; Take 1 tablet (500 mg total) by mouth 2 (two) times daily with a meal.   Dispense: 180 tablet; Refill: 1 - glipiZIDE (GLUCOTROL) 5 MG tablet; Take 1 tablet (5 mg total) by mouth 2 (two) times daily before a meal.  Dispense: 180 tablet; Refill: 1  2. Other insomnia Controlled - mirtazapine (REMERON) 15 MG tablet; Take 1 tablet (15 mg total) by mouth at bedtime.  Dispense: 90 tablet; Refill: 1  3. Poor appetite Improved - mirtazapine (  REMERON) 15 MG tablet; Take 1 tablet (15 mg total) by mouth at bedtime.  Dispense: 90 tablet; Refill: 1  4. Pure hypercholesterolemia Controlled Counseled on low cholesterol diet - atorvastatin (LIPITOR) 20 MG tablet; Take 1 tablet (20 mg total) by mouth daily.  Dispense: 90 tablet; Refill: 1    Return in about 1 month (around 08/15/2019) for Complete physical exam.      Charlott Rakes, MD, FAAFP. [facility] [place], [place]   07/18/2019, 3:02 PM

## 2019-07-18 NOTE — Patient Instructions (Signed)
Diabetes Mellitus and Exercise Exercising regularly is important for your overall health, especially when you have diabetes (diabetes mellitus). Exercising is not only about losing weight. It has many other health benefits, such as increasing muscle strength and bone density and reducing body fat and stress. This leads to improved fitness, flexibility, and endurance, all of which result in better overall health. Exercise has additional benefits for people with diabetes, including:  Reducing appetite.  Helping to lower and control blood glucose.  Lowering blood pressure.  Helping to control amounts of fatty substances (lipids) in the blood, such as cholesterol and triglycerides.  Helping the body to respond better to insulin (improving insulin sensitivity).  Reducing how much insulin the body needs.  Decreasing the risk for heart disease by: ? Lowering cholesterol and triglyceride levels. ? Increasing the levels of good cholesterol. ? Lowering blood glucose levels. What is my activity plan? Your health care provider or certified diabetes educator can help you make a plan for the type and frequency of exercise (activity plan) that works for you. Make sure that you:  Do at least 150 minutes of moderate-intensity or vigorous-intensity exercise each week. This could be brisk walking, biking, or water aerobics. ? Do stretching and strength exercises, such as yoga or weightlifting, at least 2 times a week. ? Spread out your activity over at least 3 days of the week.  Get some form of physical activity every day. ? Do not go more than 2 days in a row without some kind of physical activity. ? Avoid being inactive for more than 30 minutes at a time. Take frequent breaks to walk or stretch.  Choose a type of exercise or activity that you enjoy, and set realistic goals.  Start slowly, and gradually increase the intensity of your exercise over time. What do I need to know about managing my  diabetes?   Check your blood glucose before and after exercising. ? If your blood glucose is 240 mg/dL (13.3 mmol/L) or higher before you exercise, check your urine for ketones. If you have ketones in your urine, do not exercise until your blood glucose returns to normal. ? If your blood glucose is 100 mg/dL (5.6 mmol/L) or lower, eat a snack containing 15-20 grams of carbohydrate. Check your blood glucose 15 minutes after the snack to make sure that your level is above 100 mg/dL (5.6 mmol/L) before you start your exercise.  Know the symptoms of low blood glucose (hypoglycemia) and how to treat it. Your risk for hypoglycemia increases during and after exercise. Common symptoms of hypoglycemia can include: ? Hunger. ? Anxiety. ? Sweating and feeling clammy. ? Confusion. ? Dizziness or feeling light-headed. ? Increased heart rate or palpitations. ? Blurry vision. ? Tingling or numbness around the mouth, lips, or tongue. ? Tremors or shakes. ? Irritability.  Keep a rapid-acting carbohydrate snack available before, during, and after exercise to help prevent or treat hypoglycemia.  Avoid injecting insulin into areas of the body that are going to be exercised. For example, avoid injecting insulin into: ? The arms, when playing tennis. ? The legs, when jogging.  Keep records of your exercise habits. Doing this can help you and your health care provider adjust your diabetes management plan as needed. Write down: ? Food that you eat before and after you exercise. ? Blood glucose levels before and after you exercise. ? The type and amount of exercise you have done. ? When your insulin is expected to peak, if you use   insulin. Avoid exercising at times when your insulin is peaking.  When you start a new exercise or activity, work with your health care provider to make sure the activity is safe for you, and to adjust your insulin, medicines, or food intake as needed.  Drink plenty of water while  you exercise to prevent dehydration or heat stroke. Drink enough fluid to keep your urine clear or pale yellow. Summary  Exercising regularly is important for your overall health, especially when you have diabetes (diabetes mellitus).  Exercising has many health benefits, such as increasing muscle strength and bone density and reducing body fat and stress.  Your health care provider or certified diabetes educator can help you make a plan for the type and frequency of exercise (activity plan) that works for you.  When you start a new exercise or activity, work with your health care provider to make sure the activity is safe for you, and to adjust your insulin, medicines, or food intake as needed. This information is not intended to replace advice given to you by your health care provider. Make sure you discuss any questions you have with your health care provider. Document Revised: 12/25/2016 Document Reviewed: 11/12/2015 Elsevier Patient Education  2020 Elsevier Inc.  

## 2019-08-26 MED FILL — ?ATORVASTATIN 20 MG TABLET: 20 | 30 days supply | Qty: 30 | Fill #1

## 2019-08-26 MED FILL — ?GLIPIZIDE 5MG TABLET: 5 | 30 days supply | Qty: 60 | Fill #1

## 2019-08-26 MED FILL — ?METFORMIN HCL 500MG TABLET: 500 | 30 days supply | Qty: 60 | Fill #1

## 2019-08-30 ENCOUNTER — Ambulatory Visit: Payer: Self-pay | Attending: Family Medicine | Admitting: Family Medicine

## 2019-08-30 ENCOUNTER — Encounter: Payer: Self-pay | Admitting: Family Medicine

## 2019-08-30 ENCOUNTER — Other Ambulatory Visit: Payer: Self-pay

## 2019-08-30 VITALS — BP 145/79 | HR 97 | Ht 65.0 in | Wt 145.0 lb

## 2019-08-30 DIAGNOSIS — Z1231 Encounter for screening mammogram for malignant neoplasm of breast: Secondary | ICD-10-CM

## 2019-08-30 DIAGNOSIS — E1165 Type 2 diabetes mellitus with hyperglycemia: Secondary | ICD-10-CM

## 2019-08-30 DIAGNOSIS — Z0000 Encounter for general adult medical examination without abnormal findings: Secondary | ICD-10-CM

## 2019-08-30 DIAGNOSIS — Z124 Encounter for screening for malignant neoplasm of cervix: Secondary | ICD-10-CM

## 2019-08-30 LAB — GLUCOSE, POCT (MANUAL RESULT ENTRY): POC Glucose: 240 mg/dl — AB (ref 70–99)

## 2019-08-30 NOTE — Progress Notes (Signed)
Having trouble sleeping. 

## 2019-08-30 NOTE — Patient Instructions (Signed)
Health Maintenance, Female Adopting a healthy lifestyle and getting preventive care are important in promoting health and wellness. Ask your health care provider about:  The right schedule for you to have regular tests and exams.  Things you can do on your own to prevent diseases and keep yourself healthy. What should I know about diet, weight, and exercise? Eat a healthy diet   Eat a diet that includes plenty of vegetables, fruits, low-fat dairy products, and lean protein.  Do not eat a lot of foods that are high in solid fats, added sugars, or sodium. Maintain a healthy weight Body mass index (BMI) is used to identify weight problems. It estimates body fat based on height and weight. Your health care provider can help determine your BMI and help you achieve or maintain a healthy weight. Get regular exercise Get regular exercise. This is one of the most important things you can do for your health. Most adults should:  Exercise for at least 150 minutes each week. The exercise should increase your heart rate and make you sweat (moderate-intensity exercise).  Do strengthening exercises at least twice a week. This is in addition to the moderate-intensity exercise.  Spend less time sitting. Even light physical activity can be beneficial. Watch cholesterol and blood lipids Have your blood tested for lipids and cholesterol at 61 years of age, then have this test every 5 years. Have your cholesterol levels checked more often if:  Your lipid or cholesterol levels are high.  You are older than 61 years of age.  You are at high risk for heart disease. What should I know about cancer screening? Depending on your health history and family history, you may need to have cancer screening at various ages. This may include screening for:  Breast cancer.  Cervical cancer.  Colorectal cancer.  Skin cancer.  Lung cancer. What should I know about heart disease, diabetes, and high blood  pressure? Blood pressure and heart disease  High blood pressure causes heart disease and increases the risk of stroke. This is more likely to develop in people who have high blood pressure readings, are of African descent, or are overweight.  Have your blood pressure checked: ? Every 3-5 years if you are 18-39 years of age. ? Every year if you are 40 years old or older. Diabetes Have regular diabetes screenings. This checks your fasting blood sugar level. Have the screening done:  Once every three years after age 40 if you are at a normal weight and have a low risk for diabetes.  More often and at a younger age if you are overweight or have a high risk for diabetes. What should I know about preventing infection? Hepatitis B If you have a higher risk for hepatitis B, you should be screened for this virus. Talk with your health care provider to find out if you are at risk for hepatitis B infection. Hepatitis C Testing is recommended for:  Everyone born from 1945 through 1965.  Anyone with known risk factors for hepatitis C. Sexually transmitted infections (STIs)  Get screened for STIs, including gonorrhea and chlamydia, if: ? You are sexually active and are younger than 61 years of age. ? You are older than 61 years of age and your health care provider tells you that you are at risk for this type of infection. ? Your sexual activity has changed since you were last screened, and you are at increased risk for chlamydia or gonorrhea. Ask your health care provider if   you are at risk.  Ask your health care provider about whether you are at high risk for HIV. Your health care provider may recommend a prescription medicine to help prevent HIV infection. If you choose to take medicine to prevent HIV, you should first get tested for HIV. You should then be tested every 3 months for as long as you are taking the medicine. Pregnancy  If you are about to stop having your period (premenopausal) and  you may become pregnant, seek counseling before you get pregnant.  Take 400 to 800 micrograms (mcg) of folic acid every day if you become pregnant.  Ask for birth control (contraception) if you want to prevent pregnancy. Osteoporosis and menopause Osteoporosis is a disease in which the bones lose minerals and strength with aging. This can result in bone fractures. If you are 65 years old or older, or if you are at risk for osteoporosis and fractures, ask your health care provider if you should:  Be screened for bone loss.  Take a calcium or vitamin D supplement to lower your risk of fractures.  Be given hormone replacement therapy (HRT) to treat symptoms of menopause. Follow these instructions at home: Lifestyle  Do not use any products that contain nicotine or tobacco, such as cigarettes, e-cigarettes, and chewing tobacco. If you need help quitting, ask your health care provider.  Do not use street drugs.  Do not share needles.  Ask your health care provider for help if you need support or information about quitting drugs. Alcohol use  Do not drink alcohol if: ? Your health care provider tells you not to drink. ? You are pregnant, may be pregnant, or are planning to become pregnant.  If you drink alcohol: ? Limit how much you use to 0-1 drink a day. ? Limit intake if you are breastfeeding.  Be aware of how much alcohol is in your drink. In the [place] one drink equals one 12 oz bottle of beer (355 mL), one 5 oz glass of wine (148 mL), or one 1 oz glass of hard liquor (44 mL). General instructions  Schedule regular health, dental, and eye exams.  Stay current with your vaccines.  Tell your health care provider if: ? You often feel depressed. ? You have ever been abused or do not feel safe at home. Summary  Adopting a healthy lifestyle and getting preventive care are important in promoting health and wellness.  Follow your health care provider's instructions about healthy  diet, exercising, and getting tested or screened for diseases.  Follow your health care provider's instructions on monitoring your cholesterol and blood pressure. This information is not intended to replace advice given to you by your health care provider. Make sure you discuss any questions you have with your health care provider. Document Revised: 05/26/2018 Document Reviewed: 05/26/2018 Elsevier Patient Education  2020 Elsevier Inc.  

## 2019-08-30 NOTE — Progress Notes (Signed)
Subjective:  Patient ID: Meredith Ryan, female    DOB: 1961-02-24  Age: 59 y.o. MRN: [id number]  CC: Annual Exam and Gynecologic Exam   HPI Tacora Athanas presents for a complete physical exam.  Past Medical History:  Diagnosis Date  . Anemia   . DM type 2 (diabetes mellitus, type 2) (HCC)   . HLD (hyperlipidemia)     History reviewed. No pertinent surgical history.  Family History  Problem Relation Age of Onset  . Diabetes Mother   . Diabetes Father     No Known Allergies  Outpatient Medications Prior to Visit  Medication Sig Dispense Refill  . aspirin 81 MG tablet Take 1 tablet (81 mg total) by mouth daily. 90 tablet 0  . atorvastatin (LIPITOR) 20 MG tablet Take 1 tablet (20 mg total) by mouth daily. 90 tablet 1  . glipiZIDE (GLUCOTROL) 5 MG tablet Take 1 tablet (5 mg total) by mouth 2 (two) times daily before a meal. 180 tablet 1  . metFORMIN (GLUCOPHAGE) 500 MG tablet Take 1 tablet (500 mg total) by mouth 2 (two) times daily with a meal. 180 tablet 1  . mirtazapine (REMERON) 15 MG tablet Take 1 tablet (15 mg total) by mouth at bedtime. 90 tablet 1  . Multiple Vitamins-Minerals (MULTIVITAMIN WITH MINERALS) tablet Take 1 tablet by mouth daily. 90 tablet 1  . olopatadine (PATANOL) 0.1 % ophthalmic solution Place 1 drop into both eyes 2 (two) times daily. (Patient not taking: Reported on 08/30/2019) 5 mL 0   No facility-administered medications prior to visit.     ROS Review of Systems  Constitutional: Negative for activity change, appetite change and fatigue.  HENT: Negative for congestion, sinus pressure and sore throat.   Eyes: Negative for visual disturbance.  Respiratory: Negative for cough, chest tightness, shortness of breath and wheezing.   Cardiovascular: Negative for chest pain and palpitations.  Gastrointestinal: Negative for abdominal distention, abdominal pain and constipation.  Endocrine: Negative for polydipsia.  Genitourinary: Negative for dysuria  and frequency.  Musculoskeletal: Negative for arthralgias and back pain.  Skin: Negative for rash.  Neurological: Negative for tremors, light-headedness and numbness.  Hematological: Does not bruise/bleed easily.  Psychiatric/Behavioral: Negative for agitation and behavioral problems.    Objective:  BP (!) 145/79   Pulse 97   Ht 5\' 5"  (1.651 m)   Wt 145 lb (65.8 kg)   SpO2 99%   BMI 24.13 kg/m   BP/Weight 08/30/2019 07/18/2019 04/04/2019  Systolic BP 145 112 130  Diastolic BP 79 72 84  Wt. (Lbs) 145 140 148.2  BMI 24.13 23.3 27.11      Physical Exam Exam conducted with a chaperone present.  Constitutional:      General: She is not in acute distress.    Appearance: She is well-developed. She is not diaphoretic.  HENT:     Head: Normocephalic.     Right Ear: External ear normal.     Left Ear: External ear normal.     Nose: Nose normal.  Eyes:     Conjunctiva/sclera: Conjunctivae normal.     Pupils: Pupils are equal, round, and reactive to light.  Neck:     Vascular: No JVD.  Cardiovascular:     Rate and Rhythm: Normal rate and regular rhythm.     Heart sounds: Normal heart sounds. No murmur. No gallop.   Pulmonary:     Effort: Pulmonary effort is normal. No respiratory distress.     Breath sounds: Normal breath sounds. No  wheezing or rales.  Chest:     Chest wall: No tenderness.     Breasts:        Right: No mass, skin change or tenderness.        Left: No mass, skin change or tenderness.  Abdominal:     General: Bowel sounds are normal. There is no distension.     Palpations: Abdomen is soft. There is no mass.     Tenderness: There is no abdominal tenderness.  Genitourinary:    Comments: External genitalia-circumcised Cervix, adnexa-abnormal Musculoskeletal:        General: No tenderness. Normal range of motion.     Cervical back: Normal range of motion.  Skin:    General: Skin is warm and dry.  Neurological:     Mental Status: She is alert and oriented to  person, place, and time.     Deep Tendon Reflexes: Reflexes are normal and symmetric.     CMP Latest Ref Rng & Units 04/04/2019 04/23/2018 03/13/2017  Glucose 65 - 99 mg/dL 810(F) 751(W) 258(N)  BUN 8 - 27 mg/dL 10 12 16   Creatinine 0.57 - 1.00 mg/dL 2.77 8.24  Sodium 134 - 144 mmol/L 138 138 139  Potassium 3.5 - 5.2 mmol/L 4.8 4.6 4.9  Chloride 96 - 106 mmol/L 99 100 99  CO2 20 - 29 mmol/L 22 23 26   Calcium 8.7 - 10.3 mg/dL 9.7 9.3 9.2  Total Protein 6.0 - 8.5 g/dL 7.9 7.0 7.3  Total Bilirubin 0.0 - 1.2 mg/dL 0.2 0.3 0.3  Alkaline Phos 39 - 117 IU/L 100 85 77  AST 0 - 40 IU/L 25 19 19   ALT 0 - 32 IU/L 17 15 12     Lipid Panel     Component Value Date/Time   CHOL 165 04/23/2018 1152   TRIG 103 04/23/2018 1152   HDL 51 04/23/2018 1152   CHOLHDL 3.2 04/23/2018 1152   CHOLHDL 3.1 10/01/2015 1505   VLDL 34 (H) 10/01/2015 1505   LDLCALC 93 04/23/2018 1152    CBC    Component Value Date/Time   WBC 8.7 04/04/2019 1538   WBC 7.4 07/21/2016 1606   RBC 5.39 (H) 04/04/2019 1538   RBC 5.27 (H) 07/21/2016 1606   HGB 13.5 04/04/2019 1538   HCT 41.0 04/04/2019 1538   PLT 367 04/04/2019 1538   MCV 76 (L) 04/04/2019 1538   MCH 25.0 (L) 04/04/2019 1538   MCH 24.5 (L) 07/21/2016 1606   MCHC 32.9 04/04/2019 1538   MCHC 31.9 (L) 07/21/2016 1606   RDW 14.7 04/04/2019 1538   LYMPHSABS 3.7 (H) 04/04/2019 1538   MONOABS 518 07/21/2016 1606   EOSABS 0.3 04/04/2019 1538   BASOSABS 0.1 04/04/2019 1538    Lab Results  Component Value Date   HGBA1C 7.3 (A) 07/18/2019    Assessment & Ryan:  1. Annual physical exam Counseled on 150 minutes of exercise per week, healthy eating (including decreased daily intake of saturated fats, cholesterol, added sugars, sodium), STI prevention, routine healthcare maintenance.   2. Type 2 diabetes mellitus with hyperglycemia, without long-term current use of insulin (HCC) - POCT glucose (manual entry)  3. Screening for cervical cancer -  Cytology - PAP([facility])  4. Encounter for screening mammogram for malignant neoplasm of breast - MM 3D SCREEN BREAST BILATERAL; Future  No orders of the defined types were placed in this encounter.   Follow-up: Return in about 6 months (around 03/01/2020) for Chronic disease management.  Charlott Rakes, MD, FAAFP. [facility] [place], [place]   08/30/2019, 11:12 AM

## 2019-09-02 LAB — CYTOLOGY - PAP
Comment: NEGATIVE
Comment: NEGATIVE
Comment: NEGATIVE
Diagnosis: NEGATIVE
Diagnosis: REACTIVE
HPV 16: POSITIVE — AB
HPV 18 / 45: NEGATIVE
High risk HPV: POSITIVE — AB

## 2019-09-05 ENCOUNTER — Other Ambulatory Visit: Payer: Self-pay | Admitting: Family Medicine

## 2019-09-05 DIAGNOSIS — B977 Papillomavirus as the cause of diseases classified elsewhere: Secondary | ICD-10-CM

## 2019-09-08 ENCOUNTER — Telehealth: Payer: Self-pay

## 2019-09-08 NOTE — Telephone Encounter (Signed)
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

## 2019-09-08 NOTE — Telephone Encounter (Signed)
Patient name and DOB has been verified Patient was informed of lab results. Patient had no questions.  

## 2019-09-28 MED FILL — ?ATORVASTATIN 20 MG TABLET: 20 | 30 days supply | Qty: 30 | Fill #2

## 2019-09-28 MED FILL — ?GLIPIZIDE 5MG TABLET: 5 | 30 days supply | Qty: 60 | Fill #2

## 2019-09-28 MED FILL — METFORMIN HCL 500 MG TABS: 500 | 30 days supply | Qty: 60 | Fill #2

## 2019-11-03 MED FILL — METFORMIN HCL 500 MG TABS: 500 | 30 days supply | Qty: 60 | Fill #3

## 2019-11-03 MED FILL — ?GLIPIZIDE 5MG TABLET: 5 | 30 days supply | Qty: 60 | Fill #3

## 2019-11-03 MED FILL — ?ATORVASTATIN 20 MG TABLET: 20 | 30 days supply | Qty: 30 | Fill #3

## 2019-12-06 MED FILL — ?ATORVASTATIN 20 MG TABLET: 20 | 30 days supply | Qty: 30 | Fill #4

## 2019-12-06 MED FILL — ?GLIPIZIDE 5MG TABLET: 5 | 30 days supply | Qty: 60 | Fill #4

## 2019-12-06 MED FILL — METFORMIN HCL 500 MG TABS: 500 | 30 days supply | Qty: 60 | Fill #4

## 2020-01-05 MED FILL — ?ATORVASTATIN 20 MG TABLET: 20 | 30 days supply | Qty: 30 | Fill #5

## 2020-01-12 ENCOUNTER — Encounter: Payer: Self-pay | Admitting: Family Medicine

## 2020-01-12 ENCOUNTER — Other Ambulatory Visit: Payer: Self-pay

## 2020-01-12 ENCOUNTER — Other Ambulatory Visit: Payer: Self-pay | Admitting: Family Medicine

## 2020-01-12 ENCOUNTER — Encounter: Payer: Self-pay | Admitting: Neurology

## 2020-01-12 ENCOUNTER — Ambulatory Visit: Payer: Self-pay | Attending: Family Medicine | Admitting: Family Medicine

## 2020-01-12 VITALS — BP 146/82 | HR 110 | Ht 62.0 in | Wt 141.0 lb

## 2020-01-12 DIAGNOSIS — G4709 Other insomnia: Secondary | ICD-10-CM

## 2020-01-12 DIAGNOSIS — K009 Disorder of tooth development, unspecified: Secondary | ICD-10-CM

## 2020-01-12 DIAGNOSIS — F0390 Unspecified dementia without behavioral disturbance: Secondary | ICD-10-CM

## 2020-01-12 DIAGNOSIS — R630 Anorexia: Secondary | ICD-10-CM

## 2020-01-12 DIAGNOSIS — E1165 Type 2 diabetes mellitus with hyperglycemia: Secondary | ICD-10-CM

## 2020-01-12 DIAGNOSIS — E7800 Pure hypercholesterolemia, unspecified: Secondary | ICD-10-CM

## 2020-01-12 LAB — GLUCOSE, POCT (MANUAL RESULT ENTRY): POC Glucose: 292 mg/dl — AB (ref 70–99)

## 2020-01-12 LAB — POCT GLYCOSYLATED HEMOGLOBIN (HGB A1C): HbA1c, POC (controlled diabetic range): 7.4 % — AB (ref 0.0–7.0)

## 2020-01-12 MED ORDER — GLIPIZIDE 5 MG PO TABS: 5.0000 mg | ORAL_TABLET | Freq: Two times a day (BID) | ORAL | 1 refills | Status: DC

## 2020-01-12 MED ORDER — ATORVASTATIN CALCIUM 20 MG PO TABS: 20.0000 mg | ORAL_TABLET | Freq: Every day | ORAL | 1 refills | Status: DC

## 2020-01-12 MED ORDER — MIRTAZAPINE 30 MG PO TABS: 30.0000 mg | ORAL_TABLET | Freq: Every day | ORAL | 1 refills | Status: DC

## 2020-01-12 MED ORDER — MEMANTINE HCL 5 MG PO TABS: 5.0000 mg | ORAL_TABLET | Freq: Every day | ORAL | 3 refills | Status: DC

## 2020-01-12 MED ORDER — METFORMIN HCL 500 MG PO TABS: 500.0000 mg | ORAL_TABLET | Freq: Two times a day (BID) | ORAL | 1 refills | Status: DC

## 2020-01-12 MED FILL — ?MIRTAZAPINE 30 MG TABLET: 30 | 30 days supply | Qty: 30 | Fill #0

## 2020-01-12 MED FILL — METFORMIN HCL 500 MG TABS: 500 | 30 days supply | Qty: 60 | Fill #0

## 2020-01-12 MED FILL — MEMANTINE HCL 5 MG TABLET: 5 | 30 days supply | Qty: 30 | Fill #0

## 2020-01-12 MED FILL — ?GLIPIZIDE 5MG TABLET: 5 | 30 days supply | Qty: 60 | Fill #0

## 2020-01-12 NOTE — Patient Instructions (Signed)
Dementia Caregiver Guide Dementia is a term used to describe a number of symptoms that affect memory and thinking. The most common symptoms include:  Memory loss.  Trouble with language and communication.  Trouble concentrating.  Poor judgment.  Problems with reasoning.  Child-like behavior and language.  Extreme anxiety.  Angry outbursts.  Wandering from home or public places. Dementia usually gets worse slowly over time. In the early stages, people with dementia can stay independent and safe with some help. In later stages, they need help with daily tasks such as dressing, grooming, and using the bathroom. How to help the person with dementia cope Dementia can be frightening and confusing. Here are some tips to help the person with dementia cope with changes caused by the disease. General tips  Keep the person on track with his or her routine.  Try to identify areas where the person may need help.  Be supportive, patient, calm, and encouraging.  Gently remind the person that adjusting to changes takes time.  Help with the tasks that the person has asked for help with.  Keep the person involved in daily tasks and decisions as much as possible.  Encourage conversation, but try not to get frustrated or harried if the person struggles to find words or does not seem to appreciate your help. Communication tips  When the person is talking or seems frustrated, make eye contact and hold the person's hand.  Ask specific questions that need yes or no answers.  Use simple words, short sentences, and a calm voice. Only give one direction at a time.  When offering choices, limit them to just 1 or 2.  Avoid correcting the person in a negative way.  If the person is struggling to find the right words, gently try to help him or her. How to recognize symptoms of stress Symptoms of stress in caregivers include:  Feeling frustrated or angry with the person with  dementia.  Denying that the person has dementia or that his or her symptoms will not improve.  Feeling hopeless and unappreciated.  Difficulty sleeping.  Difficulty concentrating.  Feeling anxious, irritable, or depressed.  Developing stress-related health problems.  Feeling like you have too little time for your own life. Follow these instructions at home:   Make sure that you and the person you are caring for: ? Get regular sleep. ? Exercise regularly. ? Eat regular, nutritious meals. ? Drink enough fluid to keep your urine clear or pale yellow. ? Take over-the-counter and prescription medicines only as told by your health care providers. ? Attend all scheduled health care appointments.  Join a support group with others who are caregivers.  Ask about respite care resources so that you can have a regular break from the stress of caregiving.  Look for signs of stress in yourself and in the person you are caring for. If you notice signs of stress, take steps to manage it.  Consider any safety risks and take steps to avoid them.  Organize medications in a pill box for each day of the week.  Create a plan to handle any legal or financial matters. Get legal or financial advice if needed.  Keep a calendar in a central location to remind the person of appointments or other activities. Tips for reducing the risk of injury  Keep floors clear of clutter. Remove rugs, magazine racks, and floor lamps.  Keep hallways well lit, especially at night.  Put a handrail and nonslip mat in the bathtub or   shower.  Put childproof locks on cabinets that contain dangerous items, such as medicines, alcohol, guns, toxic cleaning items, sharp tools or utensils, matches, and lighters.  Put the locks in places where the person cannot see or reach them easily. This will help ensure that the person does not wander out of the house and get lost.  Be prepared for emergencies. Keep a list of  emergency phone numbers and addresses in a convenient area.  Remove car keys and lock garage doors so that the person does not try to get in the car and drive.  Have the person wear a bracelet that tracks locations and identifies the person as having memory problems. This should be worn at all times for safety. Where to find support: Many individuals and organizations offer support. These include:  Support groups for people with dementia and for caregivers.  Counselors or therapists.  Home health care services.  Adult day care centers. Where to find more information Alzheimer[employer]: www.alz.org Contact a health care provider if:  The person's health is rapidly getting worse.  You are no longer able to care for the person.  Caring for the person is affecting your physical and emotional health.  The person threatens himself or herself, you, or anyone else. Summary  Dementia is a term used to describe a number of symptoms that affect memory and thinking.  Dementia usually gets worse slowly over time.  Take steps to reduce the person's risk of injury, and to plan for future care.  Caregivers need support, relief from caregiving, and time for their own lives. This information is not intended to replace advice given to you by your health care provider. Make sure you discuss any questions you have with your health care provider. Document Revised: 05/15/2017 Document Reviewed: 05/06/2016 Elsevier Patient Education  2020 Elsevier Inc.  

## 2020-01-12 NOTE — Progress Notes (Signed)
Subjective:  Patient ID: Meredith Ryan, female    DOB: 1960/06/30  Age: 59 y.o. MRN: [id number]  CC: Diabetes   HPI Meredith Ryan is a59 year old female with a history of type 2 diabetes mellitus (A1c7.4), hyperlipidemiaseen for follow-up visit todayaccompanied by her spouse She denies hypoglycemic episodes, visual abnormalities or numbness in extremities and endorses compliance with her medications. She is here with her daughter who complains patient has had a poor appetite for the last one month.  At a previous visit she had been placed on Remeron for insomnia and decreased appetite which she states she has been taking.  She has been forgetting where she keep things and then gets stressed over it. She is doing well with cooking, grooming and managing her finances. She has been forgetting people's names.  Close to 9 months ago her spouse had complained of noticing the same on her Mini-Mental state exam at the time was 15/30, thyroid panel was normal.  Spouse had given the history of Alzheimer's dementia in mom. The patient informs me that she is aware that she has had a problem with short-term memory. Daughter tries to keep her busy by having her watch her grandchildren, she is also involved in gardening. With regards to her diabetes she has been compliant with her medications and she is also doing well on her statin Past Medical History:  Diagnosis Date  . Anemia   . DM type 2 (diabetes mellitus, type 2) ([facility])   . HLD (hyperlipidemia)     History reviewed. No pertinent surgical history.  Family History  Problem Relation Age of Onset  . Diabetes Mother   . Diabetes Father     No Known Allergies  Outpatient Medications Prior to Visit  Medication Sig Dispense Refill  . aspirin 81 MG tablet Take 1 tablet (81 mg total) by mouth daily. 90 tablet 0  . atorvastatin (LIPITOR) 20 MG tablet Take 1 tablet (20 mg total) by mouth daily. 90 tablet 1  . glipiZIDE (GLUCOTROL) 5 MG  tablet Take 1 tablet (5 mg total) by mouth 2 (two) times daily before a meal. 180 tablet 1  . metFORMIN (GLUCOPHAGE) 500 MG tablet Take 1 tablet (500 mg total) by mouth 2 (two) times daily with a meal. 180 tablet 1  . Multiple Vitamins-Minerals (MULTIVITAMIN WITH MINERALS) tablet Take 1 tablet by mouth daily. (Patient not taking: Reported on 01/12/2020) 90 tablet 1  . olopatadine (PATANOL) 0.1 % ophthalmic solution Place 1 drop into both eyes 2 (two) times daily. (Patient not taking: Reported on 08/30/2019) 5 mL 0  . mirtazapine (REMERON) 15 MG tablet Take 1 tablet (15 mg total) by mouth at bedtime. (Patient not taking: Reported on 01/12/2020) 90 tablet 1   No facility-administered medications prior to visit.     ROS Review of Systems  Constitutional: Positive for appetite change. Negative for activity change and fatigue.  HENT: Positive for dental problem. Negative for congestion, sinus pressure and sore throat.   Eyes: Negative for visual disturbance.  Respiratory: Negative for cough, chest tightness, shortness of breath and wheezing.   Cardiovascular: Negative for chest pain and palpitations.  Gastrointestinal: Negative for abdominal distention, abdominal pain and constipation.  Endocrine: Negative for polydipsia.  Genitourinary: Negative for dysuria and frequency.  Musculoskeletal: Negative for arthralgias and back pain.  Skin: Negative for rash.  Neurological: Negative for tremors, light-headedness and numbness.  Hematological: Does not bruise/bleed easily.  Psychiatric/Behavioral: Negative for agitation and behavioral problems.    Objective:  BP Marland Kitchen)  146/82   Pulse (!) 110   Ht 5' 2" (1.575 m)   Wt 141 lb (64 kg)   SpO2 98%   BMI 25.79 kg/m   BP/Weight 01/12/2020 2/53/6644 0/08/4740  Systolic BP 595 638 756  Diastolic BP 82 79 72  Wt. (Lbs) 141 145 140  BMI 25.79 24.13 23.3      Physical Exam Constitutional:      Appearance: She is well-developed.  HENT:      Mouth/Throat:     Comments: Loose lower incisor Neck:     Vascular: No JVD.  Cardiovascular:     Rate and Rhythm: Normal rate.     Heart sounds: Normal heart sounds. No murmur heard.   Pulmonary:     Effort: Pulmonary effort is normal.     Breath sounds: Normal breath sounds. No wheezing or rales.  Chest:     Chest wall: No tenderness.  Abdominal:     General: Bowel sounds are normal. There is no distension.     Palpations: Abdomen is soft. There is no mass.     Tenderness: There is no abdominal tenderness.  Musculoskeletal:        General: Normal range of motion.     Right lower leg: No edema.     Left lower leg: No edema.  Neurological:     Mental Status: She is alert and oriented to person, place, and time.  Psychiatric:        Mood and Affect: Mood normal.     CMP Latest Ref Rng & Units 04/04/2019 04/23/2018 03/13/2017  Glucose 65 - 99 mg/dL 137(H) 176(H) 152(H)  BUN 8 - 27 mg/dL _0 Creatinine 0.57 - 1.00 mg/dL 0.73 0.85 0.64  Sodium 134 - 144 mmol/L 138 138 139  Potassium 3.5 - 5.2 mmol/L 4.8 4.6 4.9  Chloride 96 - 106 mmol/L 99 100 99  CO2 20 - 29 mmol/L _1 Calcium 8.7 - 10.3 mg/dL 9.7 9.3 9.2  Total Protein 6.0 - 8.5 g/dL 7.9 7.0 7.3  Total Bilirubin 0.0 - 1.2 mg/dL 0.2 0.3 0.3  Alkaline Phos 39 - 117 IU/L 100 85 77  AST 0 - 40 IU/L _2 ALT 0 - 32 IU/L _3 Lipid Panel     Component Value Date/Time   CHOL 165 04/23/2018 1152   TRIG 103 04/23/2018 1152   HDL 51 04/23/2018 1152   CHOLHDL 3.2 04/23/2018 1152   CHOLHDL 3.1 10/01/2015 1505   VLDL 34 (H) 10/01/2015 1505   LDLCALC 93 04/23/2018 1152    CBC    Component Value Date/Time   WBC 8.7 04/04/2019 1538   WBC 7.4 07/21/2016 1606   RBC 5.39 (H) 04/04/2019 1538   RBC 5.27 (H) 07/21/2016 1606   HGB 13.5 04/04/2019 1538   HCT 41.0 04/04/2019 1538   PLT 367 04/04/2019 1538   MCV 76 (L) 04/04/2019 1538   MCH 25.0 (L) 04/04/2019 1538   MCH 24.5 (L) 07/21/2016 1606   MCHC  32.9 04/04/2019 1538   MCHC 31.9 (L) 07/21/2016 1606   RDW 14.7 04/04/2019 1538   LYMPHSABS 3.7 (H) 04/04/2019 1538   MONOABS 518 07/21/2016 1606   EOSABS 0.3 04/04/2019 1538   BASOSABS 0.1 04/04/2019 1538    Lab Results  Component Value Date   HGBA1C 7.4 (A) 01/12/2020    MMSE - Mini Mental State Exam 04/04/2019  Orientation to time 2  Orientation to Place  3  Registration 2  Attention/ Calculation 0  Recall 1  Language- name 2 objects 2  Language- repeat 1  Language- follow 3 step command 3  Language- read & follow direction 0  Write a sentence 0  Copy design 1  Total score 15    Assessment & Plan:  1. Type 2 diabetes mellitus with hyperglycemia, without long-term current use of insulin (HCC) Controlled with A1c of 7.4 Continue current regimen Counseled on Diabetic diet, my plate method, 324 minutes of moderate intensity exercise/week Blood sugar logs with fasting goals of 80-120 mg/dl, random of less than 180 and in the event of sugars less than 60 mg/dl or greater than 400 mg/dl encouraged to notify the clinic. Advised on the need for annual eye exams, annual foot exams, Pneumonia vaccine. - POCT glucose (manual entry) - POCT glycosylated hemoglobin (Hb A1C) - CMP14+EGFR; Future - LP+Non-HDL Cholesterol; Future - Microalbumin / creatinine urine ratio; Future - glipiZIDE (GLUCOTROL) 5 MG tablet; Take 1 tablet (5 mg total) by mouth 2 (two) times daily before a meal.  Dispense: 180 tablet; Refill: 1 - metFORMIN (GLUCOPHAGE) 500 MG tablet; Take 1 tablet (500 mg total) by mouth 2 (two) times daily with a meal.  Dispense: 180 tablet; Refill: 1  2. Pure hypercholesterolemia Controlled Low-cholesterol diet - atorvastatin (LIPITOR) 20 MG tablet; Take 1 tablet (20 mg total) by mouth daily.  Dispense: 90 tablet; Refill: 1  3. Other insomnia Increase Remeron dose - mirtazapine (REMERON) 30 MG tablet; Take 1 tablet (30 mg total) by mouth at bedtime.  Dispense: 90 tablet;  Refill: 1  4. Poor appetite Increase Remeron dose Advised to also try shakes and smoothies if her appetite is poor - mirtazapine (REMERON) 30 MG tablet; Take 1 tablet (30 mg total) by mouth at bedtime.  Dispense: 90 tablet; Refill: 1  5. Dental anomaly - Ambulatory referral to Dentistry  6. Dementia without behavioral disturbance, unspecified dementia type [facility]) Family history of Alzheimer's dementia Normal thyroid panel in 03/2019 Placed on Namenda - Ambulatory referral to Neurology   Meds ordered this encounter  Medications  . memantine (NAMENDA) 5 MG tablet    Sig: Take 1 tablet (5 mg total) by mouth daily.    Dispense:  30 tablet    Refill:  3  . atorvastatin (LIPITOR) 20 MG tablet    Sig: Take 1 tablet (20 mg total) by mouth daily.    Dispense:  90 tablet    Refill:  1  . glipiZIDE (GLUCOTROL) 5 MG tablet    Sig: Take 1 tablet (5 mg total) by mouth 2 (two) times daily before a meal.    Dispense:  180 tablet    Refill:  1  . metFORMIN (GLUCOPHAGE) 500 MG tablet    Sig: Take 1 tablet (500 mg total) by mouth 2 (two) times daily with a meal.    Dispense:  180 tablet    Refill:  1    Dose increase  . mirtazapine (REMERON) 30 MG tablet    Sig: Take 1 tablet (30 mg total) by mouth at bedtime.    Dispense:  90 tablet    Refill:  1    Dose increase    Follow-up: Return in about 6 months (around 07/14/2020) for chronic disease management.       Charlott Rakes, MD, FAAFP. [facility] and [place], [place]   01/12/2020, 12:25 PM

## 2020-01-12 NOTE — Progress Notes (Signed)
States that patient is becoming forgetful.  Needs referral to dentist.  Concerns about not eating.

## 2020-01-13 ENCOUNTER — Ambulatory Visit: Payer: Self-pay | Attending: Family Medicine

## 2020-01-13 DIAGNOSIS — E1165 Type 2 diabetes mellitus with hyperglycemia: Secondary | ICD-10-CM

## 2020-01-14 LAB — CMP14+EGFR
ALT: 16 IU/L (ref 0–32)
AST: 20 IU/L (ref 0–40)
Albumin/Globulin Ratio: 1.3 (ref 1.2–2.2)
Albumin: 4.4 g/dL (ref 3.8–4.8)
Alkaline Phosphatase: 81 IU/L (ref 48–121)
BUN/Creatinine Ratio: 12 (ref 12–28)
BUN: 10 mg/dL (ref 8–27)
Bilirubin Total: 0.5 mg/dL (ref 0.0–1.2)
CO2: 23 mmol/L (ref 20–29)
Calcium: 9.7 mg/dL (ref 8.7–10.3)
Chloride: 99 mmol/L (ref 96–106)
Creatinine, Ser: 0.85 mg/dL (ref 0.57–1.00)
GFR calc Af Amer: 86 mL/min/{1.73_m2} (ref >59)
GFR calc non Af Amer: 74 mL/min/{1.73_m2} (ref >59)
Globulin, Total: 3.4 g/dL (ref 1.5–4.5)
Glucose: 179 mg/dL — ABNORMAL HIGH (ref 65–99)
Potassium: 4.8 mmol/L (ref 3.5–5.2)
Sodium: 139 mmol/L (ref 134–144)
Total Protein: 7.8 g/dL (ref 6.0–8.5)

## 2020-01-14 LAB — LP+NON-HDL CHOLESTEROL
Cholesterol, Total: 188 mg/dL (ref 100–199)
HDL: 57 mg/dL (ref >39)
LDL Chol Calc (NIH): 114 mg/dL — ABNORMAL HIGH (ref 0–99)
Total Non-HDL-Chol (LDL+VLDL): 131 mg/dL — ABNORMAL HIGH (ref 0–129)
Triglycerides: 96 mg/dL (ref 0–149)
VLDL Cholesterol Cal: 17 mg/dL (ref 5–40)

## 2020-01-14 LAB — MICROALBUMIN / CREATININE URINE RATIO
Creatinine, Urine: 80 mg/dL
Microalb/Creat Ratio: 7 mg/g creat (ref 0–29)
Microalbumin, Urine: 5.3 ug/mL

## 2020-01-15 ENCOUNTER — Other Ambulatory Visit: Payer: Self-pay | Admitting: Family Medicine

## 2020-01-15 DIAGNOSIS — E7800 Pure hypercholesterolemia, unspecified: Secondary | ICD-10-CM

## 2020-01-16 ENCOUNTER — Ambulatory Visit: Payer: Self-pay

## 2020-01-16 ENCOUNTER — Other Ambulatory Visit: Payer: Self-pay | Admitting: Family Medicine

## 2020-01-16 ENCOUNTER — Other Ambulatory Visit: Payer: Self-pay

## 2020-01-16 DIAGNOSIS — E7800 Pure hypercholesterolemia, unspecified: Secondary | ICD-10-CM

## 2020-01-16 MED ORDER — ATORVASTATIN CALCIUM 40 MG PO TABS: 40.0000 mg | ORAL_TABLET | Freq: Every day | ORAL | 1 refills | Status: DC

## 2020-01-19 ENCOUNTER — Telehealth: Payer: Self-pay | Admitting: Family Medicine

## 2020-01-19 NOTE — Telephone Encounter (Signed)
Please advise.  Copied from CRM [id number]. Topic: General - Call Back - No Documentation >> Jan 17, 2020  5:01 PM Randol Kern wrote: Reason for CRM: Pt received phone call to call back for lab results. No CRM for [facility] to disclose, please advise  Best contact: [phone]

## 2020-01-19 NOTE — Telephone Encounter (Signed)
Patient was called and a voicemail was left informing patient to return phone call for lab results. 

## 2020-01-20 ENCOUNTER — Telehealth: Payer: Self-pay

## 2020-01-20 NOTE — Telephone Encounter (Signed)
-----   Message from Hoy Register, MD sent at 01/16/2020 11:14 AM EDT ----- Please inform her cholesterol is slightly elevated.  I have increased Lipitor to 40 mg and a new prescription sent to her pharmacy.

## 2020-01-20 NOTE — Telephone Encounter (Signed)
Patient was called and a voicemail was left informing her to return phone call. 

## 2020-01-31 MED FILL — ?ATORVASTATIN 40MG TABLET: 40 | 30 days supply | Qty: 30 | Fill #0

## 2020-02-21 MED FILL — ?GLIPIZIDE 5MG TABLET: 5 | 30 days supply | Qty: 60 | Fill #1

## 2020-02-21 MED FILL — ?METFORMIN HCL 500MG TABL: 500 | 30 days supply | Qty: 60 | Fill #1

## 2020-02-27 MED FILL — ?ATORVASTATIN 40MG TABLET: 40 | 30 days supply | Qty: 30 | Fill #1

## 2020-04-04 MED FILL — MEMANTINE HCL 5 MG TABLET: 5 | 30 days supply | Qty: 30 | Fill #1

## 2020-04-04 MED FILL — METFORMIN HCL 500 MG TABS: 500 | 30 days supply | Qty: 60 | Fill #2

## 2020-04-04 MED FILL — ?GLIPIZIDE 5MG TABLET: 5 | 30 days supply | Qty: 60 | Fill #2

## 2020-04-11 MED FILL — glipiZIDE 5 MG TABS: 5 | 30 days supply | Qty: 60 | Fill #2

## 2020-04-11 MED FILL — METFORMIN HCL 500 MG TABS: 500 | 30 days supply | Qty: 60 | Fill #2

## 2020-04-11 MED FILL — MEMANTINE HCL 5 MG TABLET: 5 | 30 days supply | Qty: 30 | Fill #1

## 2020-04-17 ENCOUNTER — Encounter: Payer: Self-pay | Admitting: Neurology

## 2020-04-17 ENCOUNTER — Other Ambulatory Visit: Payer: Self-pay

## 2020-04-17 ENCOUNTER — Ambulatory Visit (INDEPENDENT_AMBULATORY_CARE_PROVIDER_SITE_OTHER): Payer: Self-pay | Admitting: Neurology

## 2020-04-17 ENCOUNTER — Other Ambulatory Visit: Payer: Self-pay | Admitting: Neurology

## 2020-04-17 VITALS — BP 140/82 | HR 111 | Ht 62.0 in | Wt 144.4 lb

## 2020-04-17 DIAGNOSIS — F0280 Dementia in other diseases classified elsewhere without behavioral disturbance: Secondary | ICD-10-CM

## 2020-04-17 DIAGNOSIS — G300 Alzheimer's disease with early onset: Secondary | ICD-10-CM

## 2020-04-17 LAB — SEDIMENTATION RATE: Sed Rate: 65 mm/hr — ABNORMAL HIGH (ref 0–30)

## 2020-04-17 LAB — C-REACTIVE PROTEIN: CRP: <1 mg/dL (ref 0.5–20.0)

## 2020-04-17 LAB — AMMONIA: Ammonia: 14 umol/L (ref 11–35)

## 2020-04-17 MED ORDER — MEMANTINE HCL 10 MG PO TABS: ORAL_TABLET | ORAL | 11 refills | Status: DC

## 2020-04-17 MED FILL — MEMANTINE HCL 10 MG TABS: 10 | 30 days supply | Qty: 60 | Fill #0

## 2020-04-17 NOTE — Progress Notes (Signed)
NEUROLOGY CONSULTATION NOTE  Elgene Coral MRN: [id number] DOB: 07-22-1960  Referring provider: Dr. Hoy Register Primary care provider: Dr. Hoy Register  Reason for consult:  dementia  Dear Dr Alvis Lemmings:  Thank you for your kind referral of [facility] for consultation of the above symptoms. Although her history is well known to you, please allow me to reiterate it for the purpose of our medical record. The patient was accompanied to the clinic by her daughter Thereasa Distance who also provides collateral information. They declined a medical interpreter today. Records and images were personally reviewed where available.   HISTORY OF PRESENT ILLNESS: This is a pleasant 59 year old right-handed woman with a history of diabetes, diet-controlled hyperlipidemia, presenting for evaluation of dementia. Her daughter Thereasa Distance provides information and helps with translation. Records reviewed indicate that family reported memory concerns to her PCP in October 2020, misplacing things frequently. One time she gave him money to keep then forgot about it. MMSE 15/30. She reported forgetfulness during her PCP visit in 12/2019, forgetting names.She feels her memory is good. Rafa started noticing minor changes around 3 years ago, progressively worsening 2 years ago where she was forgetting her celphone in the fridge, or forgetting Rafa's name, having to go through all her siblings names to get to Rafa. She lives with her son and daughter. She manages her own medications. Her son manages finances. She does not drive. They moved from [place] to [place] in 2006. She learned some English once she moved here, but still has difficulty with the language, Rafa has to translate several times and clarify questions during the visit. 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 Rafa reports that she forgets to eat meals if no one  prepares it for her. She would eat what is in front of her, but otherwise would not prepare food. She forgets instructions, Rafa has to be with her with every step. She used to like to cook, but stopped a few years ago, saying "I just don't want to do it." She still likes to clean the house. She continues to do crochet work but after 10 minutes says she is bored. She needs help choosing her clothes, asking what she should put on. She is independent with bathing. She has been on Memantine 5mg  daily since July without side effects.  She has occasional lightheadedness when standing and some blurred vision. Rafa has noticed her lips and eyes twitch sometimes. She gets headaches when she tries to concentrate. She gets scared/nervous about answering in August. She is able to speak in Arabic and her other dialect. Rafa has noticed that when she remembers her parents, she starts crying, which is new. No hallucinations. She denies any dysarthria/dysphagia, neck/back pain, focal numbness/tingling/weakness, bowel/bladder dysfunction, anosmia, or tremors. Her mother had Alzheimer's disease in her late 49s. No history of significant head injuries or alcohol use.    Laboratory Data: Lab Results  Component Value Date   TSH 2.390 04/04/2019   Lab Results  Component Value Date   VITAMINB12 648 07/15/2012   Lab Results  Component Value Date   HGBA1C 7.4 (A) 01/12/2020     PAST MEDICAL HISTORY: Past Medical History:  Diagnosis Date  . Anemia   . DM type 2 (diabetes mellitus, type 2) (HCC)   . HLD (hyperlipidemia)     PAST SURGICAL HISTORY: History reviewed. No pertinent  surgical history.  MEDICATIONS: Current Outpatient Medications on File Prior to Visit  Medication Sig Dispense Refill  . glipiZIDE (GLUCOTROL) 5 MG tablet Take 1 tablet (5 mg total) by mouth 2 (two) times daily before a meal. 180 tablet 1  . memantine (NAMENDA) 5 MG tablet Take 1 tablet (5 mg total) by mouth daily. 30 tablet 3  .  metFORMIN (GLUCOPHAGE) 500 MG tablet Take 1 tablet (500 mg total) by mouth 2 (two) times daily with a meal. 180 tablet 1   No current facility-administered medications on file prior to visit.    ALLERGIES: No Known Allergies  FAMILY HISTORY: Family History  Problem Relation Age of Onset  . Diabetes Mother   . Diabetes Father     SOCIAL HISTORY: Social History   Socioeconomic History  . Marital status: Married    Spouse name: Not on file  . Number of children: Not on file  . Years of education: Not on file  . Highest education level: Not on file  Occupational History  . Not on file  Tobacco Use  . Smoking status: Never Smoker  . Smokeless tobacco: Never Used  Vaping Use  . Vaping Use: Never used  Substance and Sexual Activity  . Alcohol use: No  . Drug use: No  . Sexual activity: Yes  Other Topics Concern  . Not on file  Social History Narrative   Lives with husband, 3 children. (23, 72, 28) Oldest daughter married.    Was working at [employer], but laid off.   Right hand   Social Determinants of Health   Financial Resource Strain:   . Difficulty of Paying Living Expenses: Not on file  Food Insecurity:   . Worried About Programme researcher, broadcasting/film/video in the Last Year: Not on file  . Ran Out of Food in the Last Year: Not on file  Transportation Needs:   . Lack of Transportation (Medical): Not on file  . Lack of Transportation (Non-Medical): Not on file  Physical Activity:   . Days of Exercise per Week: Not on file  . Minutes of Exercise per Session: Not on file  Stress:   . Feeling of Stress : Not on file  Social Connections:   . Frequency of Communication with Friends and Family: Not on file  . Frequency of Social Gatherings with Friends and Family: Not on file  . Attends Religious Services: Not on file  . Active Member of Clubs or Organizations: Not on file  . Attends Banker Meetings: Not on file  . Marital Status: Not on file  Intimate Partner  Violence:   . Fear of Current or Ex-Partner: Not on file  . Emotionally Abused: Not on file  . Physically Abused: Not on file  . Sexually Abused: Not on file     PHYSICAL EXAM: Vitals:   04/17/20 0905  BP: 140/82  Pulse: (!) 111  SpO2: 98%   General: No acute distress Head:  Normocephalic/atraumatic Skin/Extremities: No rash, no edema Neurological Exam: Mental status: alert and oriented to person, place. No dysarthria or aphasia, Fund of knowledge is reduced.  Recent and remote memory are impaired. Attention and concentration are normal. She had difficulty completing MMSE today, appears both from language barrier and memory (difficulty despite daughter translating). Cranial nerves: CN I: not tested CN II: pupils equal, round and reactive to light, visual fields intact CN III, IV, VI:  full range of motion, no nystagmus, no ptosis CN V: facial sensation intact  CN VII: upper and lower face symmetric CN VIII: hearing intact to conversation CN IX, X: gag intact, uvula midline CN XI: sternocleidomastoid and trapezius muscles intact CN XII: tongue midline Bulk & Tone: normal, no fasciculations. Motor: 5/5 throughout with no pronator drift. Sensation: intact to light touch, cold.  No extinction to double simultaneous stimulation.  Romberg test negative Deep Tendon Reflexes: +1 throughout Cerebellar: no incoordination on finger to nose testing Gait: narrow-based and steady, no ataxia Tremor: none   IMPRESSION: This is a pleasant 59 year old right-handed woman with a history of diabetes, diet-controlled hyperlipidemia, presenting for evaluation of dementia. Symptoms started around 3 years ago with continued progression, now needing assistance with meals, dressing. They both report she manages her medications fine, continue to monitor. Unable to do MMSE testing today, last MMSE in October 2020 at PCP office was 15/30. Although there is likely a language barrier component, she also has  notable cognitive deficits concerning for early onset dementia. Her mother also had early onset dementia. MRI brain with and without contrast will be ordered to assess for underlying structural abnormality. Additional bloodwork will be ordered. Increase Memantine to 10mg  qhs x 2 weeks, then to 10mg  BID. Expectations from medication were discussed with the patient and her daughter. Continue close supervision. Follow-up in 6-8 months, they know to call for any changes.   Thank you for allowing me to participate in the care of this patient. Please do not hesitate to call for any questions or concerns.   , M.D.  CC: Dr. 

## 2020-04-17 NOTE — Patient Instructions (Signed)
1. Bloodwork for RPR, ammonia, ESR, CRP, ANA 2. Schedule MRI brain with and without contrast 3. Increase Memantine to 59m: take 1 tablet every night for 2 weeks, then increase to 1 tablet in AM, 1 tablet in PM 4. Follow-up in 6-8 months, call for any changes  FALL PRECAUTIONS: Be cautious when walking. Scan the area for obstacles that may increase the risk of trips and falls. When getting up in the mornings, sit up at the edge of the bed for a few minutes before getting out of bed. Consider elevating the bed at the head end to avoid drop of blood pressure when getting up. Walk always in a well-lit room (use night lights in the walls). Avoid area rugs or power cords from appliances in the middle of the walkways. Use a walker or a cane if necessary and consider physical therapy for balance exercise. Get your eyesight checked regularly.  HOME SAFETY: Consider the safety of the kitchen when operating appliances like stoves, microwave oven, and blender. Consider having supervision and share cooking responsibilities until no longer able to participate in those. Accidents with firearms and other hazards in the house should be identified and addressed as well.  DRIVING: Regarding driving, in patients with progressive memory problems, driving will be impaired. We advise to have someone else do the driving if trouble finding directions or if minor accidents are reported. Independent driving assessment is available to determine safety of driving.  ABILITY TO BE LEFT ALONE: If patient is unable to contact 911 operator, consider using LifeLine, or when the need is there, arrange for someone to stay with patients. Smoking is a fire hazard, consider supervision or cessation. Risk of wandering should be assessed by caregiver and if detected at any point, supervision and safe proof recommendations should be instituted.  MEDICATION SUPERVISION: Inability to self-administer medication needs to be constantly addressed.  Implement a mechanism to ensure safe administration of the medications.  RECOMMENDATIONS FOR ALL PATIENTS WITH MEMORY PROBLEMS: 1. Continue to exercise (Recommend 30 minutes of walking everyday, or 3 hours every week) 2. Increase social interactions - continue [place] with friends and family 3. Eat healthy, avoid fried foods and eat more fruits and vegetables 4. Maintain adequate blood pressure, blood sugar, and blood cholesterol level. Reducing the risk of stroke and cardiovascular disease also helps promoting better memory. 5. Avoid stressful situations. Live a simple life and avoid aggravations. Organize your time and prepare for the next day in anticipation. 6. Sleep well, avoid any interruptions of sleep and avoid any distractions in the bedroom that may interfere with adequate sleep quality 7. Avoid sugar, avoid sweets as there is a strong link between excessive sugar intake, diabetes, and cognitive impairment We discussed the Mediterranean diet, which has been shown to help patients reduce the risk of progressive memory disorders and reduces cardiovascular risk. This includes eating fish, eat fruits and green leafy vegetables, nuts like almonds and hazelnuts, walnuts, and also use olive oil. Avoid fast foods and fried foods as much as possible. Avoid sweets and sugar as sugar use has been linked to worsening of memory function.  There is always a concern of gradual progression of memory problems. If this is the case, then we may need to adjust level of care according to patient needs. Support, both to the patient and caregiver, should then be put into place.

## 2020-04-19 LAB — ANA: Anti Nuclear Antibody (ANA): NEGATIVE

## 2020-04-19 LAB — RPR: RPR Ser Ql: NONREACTIVE

## 2020-05-16 MED FILL — glipiZIDE 5 MG TABS: 5 | 30 days supply | Qty: 60 | Fill #3

## 2020-05-18 MED FILL — METFORMIN HCL 500 MG TABS: 500 | 30 days supply | Qty: 60 | Fill #3

## 2020-05-18 MED FILL — MEMANTINE HCL 10 MG TABS: 10 | 30 days supply | Qty: 60 | Fill #0

## 2020-05-21 ENCOUNTER — Other Ambulatory Visit: Payer: Self-pay

## 2020-05-21 ENCOUNTER — Ambulatory Visit: Payer: Self-pay | Attending: Physician Assistant | Admitting: Physician Assistant

## 2020-05-21 ENCOUNTER — Other Ambulatory Visit: Payer: Self-pay | Admitting: Physician Assistant

## 2020-05-21 VITALS — BP 123/80 | HR 97 | Temp 98.2°F | Resp 18 | Ht 65.0 in | Wt 146.0 lb

## 2020-05-21 DIAGNOSIS — G4709 Other insomnia: Secondary | ICD-10-CM

## 2020-05-21 DIAGNOSIS — F0390 Unspecified dementia without behavioral disturbance: Secondary | ICD-10-CM

## 2020-05-21 DIAGNOSIS — E7800 Pure hypercholesterolemia, unspecified: Secondary | ICD-10-CM

## 2020-05-21 DIAGNOSIS — E1165 Type 2 diabetes mellitus with hyperglycemia: Secondary | ICD-10-CM

## 2020-05-21 DIAGNOSIS — K009 Disorder of tooth development, unspecified: Secondary | ICD-10-CM

## 2020-05-21 LAB — POCT GLYCOSYLATED HEMOGLOBIN (HGB A1C): Hemoglobin A1C: 9.5 % — AB (ref 4.0–5.6)

## 2020-05-21 LAB — GLUCOSE, POCT (MANUAL RESULT ENTRY): POC Glucose: 340 mg/dl — AB (ref 70–99)

## 2020-05-21 MED ORDER — TRUEPLUS LANCETS 28G MISC: 1.0000 | Freq: Two times a day (BID) | 1 refills | Status: DC

## 2020-05-21 MED ORDER — METFORMIN HCL 500 MG PO TABS: 1000.0000 mg | ORAL_TABLET | Freq: Two times a day (BID) | ORAL | 1 refills | Status: DC

## 2020-05-21 MED ORDER — GLIPIZIDE 5 MG PO TABS
5.0000 mg | ORAL_TABLET | Freq: Two times a day (BID) | ORAL | 1 refills | Status: DC
  Filled 2020-10-09: qty 60, 30d supply, fill #0

## 2020-05-21 MED ORDER — MIRTAZAPINE 30 MG PO TABS: 30.0000 mg | ORAL_TABLET | Freq: Every day | ORAL | 1 refills | Status: DC

## 2020-05-21 MED ORDER — TRUE METRIX METER W/DEVICE KIT: 1.0000 [IU] | PACK | Freq: Two times a day (BID) | 0 refills | Status: AC

## 2020-05-21 MED ORDER — TRUE METRIX BLOOD GLUCOSE TEST VI STRP: ORAL_STRIP | 12 refills | Status: DC

## 2020-05-21 MED FILL — TRUEplus LANCETS 28G MISC: 50 days supply | Qty: 100 | Fill #0

## 2020-05-21 MED FILL — TRUE METRIX TEST STRIP: 25 days supply | Qty: 100 | Fill #0

## 2020-05-21 MED FILL — METFORMIN HCL 500 MG TABS: 500 | 30 days supply | Qty: 120 | Fill #0

## 2020-05-21 MED FILL — TRUE METRIX GO GLUCOSE METE: W/DEVICE | 1 days supply | Qty: 1 | Fill #0

## 2020-05-21 MED FILL — MIRTAZAPINE 30 MG TABLET: 30 | 30 days supply | Qty: 30 | Fill #0

## 2020-05-21 NOTE — Progress Notes (Signed)
Established Patient Office Visit  Subjective:  Patient ID: Meredith Ryan, female    DOB: [birth year]  Age: 59 y.o. MRN: [id number]  CC:  Chief Complaint  Patient presents with   Diabetes    HPI Chamille Werntz reports that she has been compliant to medications, has not been checking blood sugar at home, has not been following a diabetic diet.  Reports that she is using the Remeron as needed, states that she does not use it more than once a week, with relief.  Reports that she was  seen by neurology,Memantine increased to 59m BID and is scheduled to follow-up in approximately 5 more months.  Daughter is present and provides majority of history.  [place]  Daughter is concerned that mother will not be able to sleep due to jet lag.  Is concerned that she may have diarrhea due to change in water  Reports that appetite has improved, is eating much better      Past Medical History:  Diagnosis Date   Anemia    DM type 2 (diabetes mellitus, type 2) (HCC)    HLD (hyperlipidemia)     History reviewed. No pertinent surgical history.  Family History  Problem Relation Age of Onset   Diabetes Mother    Diabetes Father     Social History   Socioeconomic History   Marital status: Married    Spouse name: Not on file   Number of children: Not on file   Years of education: Not on file   Highest education level: Not on file  Occupational History   Not on file  Tobacco Use   Smoking status: Never Smoker   Smokeless tobacco: Never Used  Vaping Use   Vaping Use: Never used  Substance and Sexual Activity   Alcohol use: No   Drug use: No   Sexual activity: Yes  Other Topics Concern   Not on file  Social History Narrative   Lives with husband, 3 children. ([phone] Oldest daughter married.    Was working at [employer] but laid off.   Right hand   Social  Determinants of Health   Financial Resource Strain:    Difficulty of Paying Living Expenses: Not on file  Food Insecurity:    Wor[place] About [place] the Last Year: Not on file   Ran Out of Food in the Last Year: Not on file  Transportation Needs:    Lack of Transportation (Medical): Not on file   Lack of Transportation (Non-Medical): Not on file  Physical Activity:    Days of Exercise per Week: Not on file   Minutes of Exercise per Session: Not on file  Stress:    Feeling of Stress : Not on file  Social Connections:    Frequency of Communication with Friends and Family: Not on file   Frequency of Social Gatherings with Friends and Family: Not on file   Attends Religious Services: Not on file   Active Member of Clubs or Organizations: Not on file   Attends CArchivistMeetings: Not on file   Marital Status: Not on file  Intimate Partner Violence:    Fear of Current or Ex-Partner: Not on file   Emotionally Abused: Not on file   Physically Abused: Not on file   Sexually Abused: Not on file    Outpatient Medications Prior to Visit  Medication  Sig Dispense Refill   memantine (NAMENDA) 10 MG tablet Take 1 tablet every night for 2 weeks, then increase to 1 tablet in AM, 1 tablet in PM 60 tablet 11   glipiZIDE (GLUCOTROL) 5 MG tablet Take 1 tablet (5 mg total) by mouth 2 (two) times daily before a meal. 180 tablet 1   metFORMIN (GLUCOPHAGE) 500 MG tablet Take 1 tablet (500 mg total) by mouth 2 (two) times daily with a meal. 180 tablet 1   No facility-administered medications prior to visit.    No Known Allergies  ROS Review of Systems    Objective:    Physical Exam  BP 123/80 (BP Location: Left Arm, Patient Position: Sitting, Cuff Size: Normal)    Pulse 97    Temp 98.2 F (36.8 C) (Oral)    Resp 18    Ht 5' 5"  (1.651 m)    Wt 146 lb (66.2 kg)    SpO2 100%    BMI 24.30 kg/m  Wt Readings from Last 3 Encounters:  05/21/20 146 lb  (66.2 kg)  04/17/20 144 lb 6.4 oz (65.5 kg)  01/12/20 141 lb (64 kg)     Health Maintenance Due  Topic Date Due   OPHTHALMOLOGY EXAM  03/21/2014   MAMMOGRAM  10/09/2017   INFLUENZA VACCINE  01/15/2020    There are no preventive care reminders to display for this patient.  Lab Results  Component Value Date   TSH 2.390 04/04/2019   Lab Results  Component Value Date   WBC 8.7 04/04/2019   HGB 13.5 04/04/2019   HCT 41.0 04/04/2019   MCV 76 (L) 04/04/2019   PLT 367 04/04/2019   Lab Results  Component Value Date   NA 139 01/13/2020   K 4.8 01/13/2020   CO2 23 01/13/2020   GLUCOSE 179 (H) 01/13/2020   BUN 10 01/13/2020   CREATININE 0.85 01/13/2020   BILITOT 0.5 01/13/2020   ALKPHOS 81 01/13/2020   AST 20 01/13/2020   ALT 16 01/13/2020   PROT 7.8 01/13/2020   ALBUMIN 4.4 01/13/2020   CALCIUM 9.7 01/13/2020   Lab Results  Component Value Date   CHOL 188 01/13/2020   Lab Results  Component Value Date   HDL 57 01/13/2020   Lab Results  Component Value Date   LDLCALC 114 (H) 01/13/2020   Lab Results  Component Value Date   TRIG 96 01/13/2020   Lab Results  Component Value Date   CHOLHDL 3.2 04/23/2018   Lab Results  Component Value Date   HGBA1C 9.5 (A) 05/21/2020      Assessment & Plan:   Problem List Items Addressed This Visit      Endocrine   Type 2 diabetes mellitus ([place]) - Primary   Relevant Medications   metFORMIN (GLUCOPHAGE) 500 MG tablet   glipiZIDE (GLUCOTROL) 5 MG tablet   glucose blood (TRUE METRIX BLOOD GLUCOSE TEST) test strip   Blood Glucose Monitoring Suppl (TRUE METRIX METER) w/Device KIT   TRUEplus Lancets 28G MISC   Other Relevant Orders   POCT A1C (Completed)   Glucose (CBG) (Completed)   CBC with Differential/Platelet   Comp. Metabolic Panel (12)   TSH   Vitamin D, 25-hydroxy   Microalbumin / creatinine urine ratio     Other   Hyperlipidemia   Relevant Orders   Lipid panel   Insomnia   Relevant Medications    mirtazapine (REMERON) 30 MG tablet    Other Visit Diagnoses    Dental anomaly  Dementia without behavioral disturbance, unspecified dementia type (HCC)       Relevant Medications   mirtazapine (REMERON) 30 MG tablet     1. Type 2 diabetes mellitus with hyperglycemia, without long-term current use of insulin (HCC) Patient's A1C increased from 7.4 four months ago to 9.5.  Patient education given on checking blood glucose levels at home, following diabetic diet.  Increase Metformin 1000 mg twice daily, keep blood glucose log, bring to next appointment in 4 weeks with clinical pharmacist.  Follow-up with PCP in 3 months - POCT A1C - Glucose (CBG) - CBC with Differential/Platelet; Future - Comp. Metabolic Panel (12); Future - TSH; Future - Vitamin D, 25-hydroxy; Future - Microalbumin / creatinine urine ratio; Future - metFORMIN (GLUCOPHAGE) 500 MG tablet; Take 2 tablets (1,000 mg total) by mouth 2 (two) times daily with a meal.  Dispense: 180 tablet; Refill: 1 - glipiZIDE (GLUCOTROL) 5 MG tablet; Take 1 tablet (5 mg total) by mouth 2 (two) times daily before a meal.  Dispense: 180 tablet; Refill: 1 - glucose blood (TRUE METRIX BLOOD GLUCOSE TEST) test strip; Use as instructed  Dispense: 100 each; Refill: 12 - Blood Glucose Monitoring Suppl (TRUE METRIX METER) w/Device KIT; 1 Units by Does not apply route in the morning and at bedtime.  Dispense: 1 kit; Refill: 0 - TRUEplus Lancets 28G MISC; 1 each by Does not apply route in the morning and at bedtime.  Dispense: 100 each; Refill: 1  2. Other insomnia Continue current regimen - mirtazapine (REMERON) 30 MG tablet; Take 1 tablet (30 mg total) by mouth at bedtime.  Dispense: 30 tablet; Refill: 1  3. Pure hypercholesterolemia  - Lipid panel; Future  4. Dental anomaly Gave patient information for follow up on referal  5. Dementia without behavioral disturbance, unspecified dementia type ([place]) Continue f/u with neurology  Reviewed  travel recommendations to [place] on [employer], patient education given for vaccines recommended as well as preventive medications  Meds ordered this encounter  Medications   metFORMIN (GLUCOPHAGE) 500 MG tablet    Sig: Take 2 tablets (1,000 mg total) by mouth 2 (two) times daily with a meal.    Dispense:  180 tablet    Refill:  1    Dose increase    Order Specific Question:   Supervising Provider    Answer:   Joya Gaskins, PATRICK E [[id number]]   glipiZIDE (GLUCOTROL) 5 MG tablet    Sig: Take 1 tablet (5 mg total) by mouth 2 (two) times daily before a meal.    Dispense:  180 tablet    Refill:  1    Order Specific Question:   Supervising Provider    Answer:   Asencion Noble E [[id number]]   mirtazapine (REMERON) 30 MG tablet    Sig: Take 1 tablet (30 mg total) by mouth at bedtime.    Dispense:  30 tablet    Refill:  1    Order Specific Question:   Supervising Provider    Answer:   Joya Gaskins, PATRICK E [[id number]]   glucose blood (TRUE METRIX BLOOD GLUCOSE TEST) test strip    Sig: Use as instructed    Dispense:  100 each    Refill:  12    Order Specific Question:   Supervising Provider    Answer:   Asencion Noble E [[id number]]   Blood Glucose Monitoring Suppl (TRUE METRIX METER) w/Device KIT    Sig: 1 Units by Does not apply route in the morning and at bedtime.  Dispense:  1 kit    Refill:  0    Order Specific Question:   Supervising Provider    Answer:   Joya Gaskins, PATRICK E [[id number]]   TRUEplus Lancets 28G MISC    Sig: 1 each by Does not apply route in the morning and at bedtime.    Dispense:  100 each    Refill:  1    Order Specific Question:   Supervising Provider    Answer:   Asencion Noble E [[id number]]    I have reviewed the patient's medical history (PMH, PSH, Social History, Family History, Medications, and allergies) , and have been updated if relevant. I spent 30 minutes reviewing chart and  face to face time with patient.    Follow-up: No follow-ups on file.    Loraine Grip Mayers, PA-C

## 2020-05-21 NOTE — Patient Instructions (Addendum)
For the diabetes, please work on a low sugar diet, I increase the dose of the Metformin to 1000 mg twice a day, glipizide will stay the same at this time.  I sent a prescription for a blood glucose meter to the pharmacy.  Please check blood sugar twice daily as we discussed, keep a written log and bring it with you to all appointments.  I will research the current CDC recommendations for travel to [place] and will update you on necessary medications if needed.  Please follow-up with neurology for more information regarding the MRI.  Please follow-up with an eye doctor for an updated vision exam  Your dental referral was sent to  [facility] ph. # [phone] [id number]   Please return to the clinic for fasting labs, we will call you with your lab results    Roney Jaffe, PA-C Physician Assistant [facility] [email or web address]   Blood Glucose Monitoring, Adult Monitoring your blood sugar (glucose) is an important part of managing your diabetes (diabetes mellitus). Blood glucose monitoring involves checking your blood glucose as often as directed and keeping a record (log) of your results over time. Checking your blood glucose regularly and keeping a blood glucose log can:  Help you and your health care provider adjust your diabetes management plan as needed, including your medicines or insulin.  Help you understand how food, exercise, illnesses, and medicines affect your blood glucose.  Let you know what your blood glucose is at any time. You can quickly find out if you have low blood glucose (hypoglycemia) or high blood glucose (hyperglycemia). Your health care provider will set individualized treatment goals for you. Your goals will be based on your age, other medical conditions you have, and how you respond to diabetes treatment. Generally, the goal of treatment is to maintain the following blood glucose levels:  Before meals  (preprandial): 80-130 mg/dL (2.3-5.3 mmol/L).  After meals (postprandial): below 180 mg/dL (10 mmol/L).  A1c level: less than 7%. Supplies needed:  Blood glucose meter.  Test strips for your meter. Each meter has its own strips. You must use the strips that came with your meter.  A needle to prick your finger (lancet). Do not use a lancet more than one time.  A device that holds the lancet (lancing device).  A journal or log book to write down your results. How to check your blood glucose  1. Wash your hands with soap and water. 2. Prick the side of your finger (not the tip) with the lancet. Use a different finger each time. 3. Gently rub the finger until a small drop of blood appears. 4. Follow instructions that come with your meter for inserting the test strip, applying blood to the strip, and using your blood glucose meter. 5. Write down your result and any notes. Some meters allow you to use areas of your body other than your finger (alternative sites) to test your blood. The most common alternative sites are:  Forearm.  Thigh.  Palm of the hand. If you think you may have hypoglycemia, or if you have a history of not knowing when your blood glucose is getting low (hypoglycemia unawareness), do not use alternative sites. Use your finger instead. Alternative sites may not be as accurate as the fingers, because blood flow is slower in these areas. This means that the result you get may be delayed, and it may be different from the result that you would get from your finger. Follow these  instructions at home: Blood glucose log   Every time you check your blood glucose, write down your result. Also write down any notes about things that may be affecting your blood glucose, such as your diet and exercise for the day. This information can help you and your health care provider: ? Look for patterns in your blood glucose over time. ? Adjust your diabetes management plan as  needed.  Check if your meter allows you to download your records to a computer. Most glucose meters store a record of glucose readings in the meter. If you have type 1 diabetes:  Check your blood glucose 2 or more times a day.  Also check your blood glucose: ? Before every insulin injection. ? Before and after exercise. ? Before meals. ? 2 hours after a meal. ? Occasionally between 2:00 a.m. and 3:00 a.m., as directed. ? Before potentially dangerous tasks, like driving or using heavy machinery. ? At bedtime.  You may need to check your blood glucose more often, up to 6-10 times a day, if you: ? Use an insulin pump. ? Need multiple daily injections (MDI). ? Have diabetes that is not well-controlled. ? Are ill. ? Have a history of severe hypoglycemia. ? Have hypoglycemia unawareness. If you have type 2 diabetes:  If you take insulin or other diabetes medicines, check your blood glucose 2 or more times a day.  If you are on intensive insulin therapy, check your blood glucose 4 or more times a day. Occasionally, you may also need to check between 2:00 a.m. and 3:00 a.m., as directed.  Also check your blood glucose: ? Before and after exercise. ? Before potentially dangerous tasks, like driving or using heavy machinery.  You may need to check your blood glucose more often if: ? Your medicine is being adjusted. ? Your diabetes is not well-controlled. ? You are ill. General tips  Always keep your supplies with you.  If you have questions or need help, all blood glucose meters have a 24-hour "hotline" phone number that you can call. You may also contact your health care provider.  After you use a few boxes of test strips, adjust (calibrate) your blood glucose meter by following instructions that came with your meter. Contact a health care provider if:  Your blood glucose is at or above 240 mg/dL (50.3 mmol/L) for 2 days in a row.  You have been sick or have had a fever for 2  days or longer, and you are not getting better.  You have any of the following problems for more than 6 hours: ? You cannot eat or drink. ? You have nausea or vomiting. ? You have diarrhea. Get help right away if:  Your blood glucose is lower than 54 mg/dL (3 mmol/L).  You become confused or you have trouble thinking clearly.  You have difficulty breathing.  You have moderate or large ketone levels in your urine. Summary  Monitoring your blood sugar (glucose) is an important part of managing your diabetes (diabetes mellitus).  Blood glucose monitoring involves checking your blood glucose as often as directed and keeping a record (log) of your results over time.  Your health care provider will set individualized treatment goals for you. Your goals will be based on your age, other medical conditions you have, and how you respond to diabetes treatment.  Every time you check your blood glucose, write down your result. Also write down any notes about things that may be affecting your blood glucose,  such as your diet and exercise for the day. This information is not intended to replace advice given to you by your health care provider. Make sure you discuss any questions you have with your health care provider. Document Revised: 03/26/2018 Document Reviewed: 11/12/2015 Elsevier Patient Education  2020 [employer].

## 2020-05-21 NOTE — Progress Notes (Signed)
Patient has no pain. Patient has eaten and taken medication today.

## 2020-05-22 ENCOUNTER — Ambulatory Visit: Payer: Self-pay | Attending: Family Medicine

## 2020-05-22 ENCOUNTER — Encounter: Payer: Self-pay | Admitting: Physician Assistant

## 2020-05-22 DIAGNOSIS — E1165 Type 2 diabetes mellitus with hyperglycemia: Secondary | ICD-10-CM

## 2020-05-22 DIAGNOSIS — E7800 Pure hypercholesterolemia, unspecified: Secondary | ICD-10-CM

## 2020-05-23 ENCOUNTER — Telehealth: Payer: Self-pay | Admitting: *Deleted

## 2020-05-23 LAB — COMP. METABOLIC PANEL (12)
AST: 20 IU/L (ref 0–40)
Albumin/Globulin Ratio: 1.5 (ref 1.2–2.2)
Albumin: 4.5 g/dL (ref 3.8–4.8)
Alkaline Phosphatase: 95 IU/L (ref 44–121)
BUN/Creatinine Ratio: 12 (ref 12–28)
BUN: 10 mg/dL (ref 8–27)
Bilirubin Total: 0.4 mg/dL (ref 0.0–1.2)
Calcium: 9.5 mg/dL (ref 8.7–10.3)
Chloride: 99 mmol/L (ref 96–106)
Creatinine, Ser: 0.82 mg/dL (ref 0.57–1.00)
GFR calc Af Amer: 89 mL/min/{1.73_m2} (ref >59)
GFR calc non Af Amer: 77 mL/min/{1.73_m2} (ref >59)
Globulin, Total: 3 g/dL (ref 1.5–4.5)
Glucose: 241 mg/dL — ABNORMAL HIGH (ref 65–99)
Potassium: 4.5 mmol/L (ref 3.5–5.2)
Sodium: 136 mmol/L (ref 134–144)
Total Protein: 7.5 g/dL (ref 6.0–8.5)

## 2020-05-23 LAB — CBC WITH DIFFERENTIAL/PLATELET
Basophils Absolute: 0.1 10*3/uL (ref 0.0–0.2)
Basos: 1 %
EOS (ABSOLUTE): 0.3 10*3/uL (ref 0.0–0.4)
Eos: 3 %
Hematocrit: 43.3 % (ref 34.0–46.6)
Hemoglobin: 13.6 g/dL (ref 11.1–15.9)
Immature Grans (Abs): 0 10*3/uL (ref 0.0–0.1)
Immature Granulocytes: 0 %
Lymphocytes Absolute: 3 10*3/uL (ref 0.7–3.1)
Lymphs: 40 %
MCH: 24.5 pg — ABNORMAL LOW (ref 26.6–33.0)
MCHC: 31.4 g/dL — ABNORMAL LOW (ref 31.5–35.7)
MCV: 78 fL — ABNORMAL LOW (ref 79–97)
Monocytes Absolute: 0.5 10*3/uL (ref 0.1–0.9)
Monocytes: 6 %
Neutrophils Absolute: 3.7 10*3/uL (ref 1.4–7.0)
Neutrophils: 50 %
Platelets: 350 10*3/uL (ref 150–450)
RBC: 5.54 x10E6/uL — ABNORMAL HIGH (ref 3.77–5.28)
RDW: 15.5 % — ABNORMAL HIGH (ref 11.7–15.4)
WBC: 7.5 10*3/uL (ref 3.4–10.8)

## 2020-05-23 LAB — LIPID PANEL
Chol/HDL Ratio: 4.3 ratio (ref 0.0–4.4)
Cholesterol, Total: 288 mg/dL — ABNORMAL HIGH (ref 100–199)
HDL: 67 mg/dL (ref >39)
LDL Chol Calc (NIH): 202 mg/dL — ABNORMAL HIGH (ref 0–99)
Triglycerides: 108 mg/dL (ref 0–149)
VLDL Cholesterol Cal: 19 mg/dL (ref 5–40)

## 2020-05-23 LAB — VITAMIN D 25 HYDROXY (VIT D DEFICIENCY, FRACTURES): Vit D, 25-Hydroxy: 53.3 ng/mL (ref 30.0–100.0)

## 2020-05-23 LAB — TSH: TSH: 3.51 u[IU]/mL (ref 0.450–4.500)

## 2020-05-23 NOTE — Telephone Encounter (Signed)
-----   Message from Roney Jaffe, [place] sent at 05/23/2020  9:51 AM EST ----- Please call patient and let her know that her cholesterol level was elevated, please verify that she was fasting during this lab, 4 months ago her cholesterol level was closer to normal limits.  If she was not fasting, she does need to have her cholesterol rechecked.  Her vitamin D, thyroid, kidney, and liver function were within normal limits.  Blood count stable.

## 2020-05-23 NOTE — Telephone Encounter (Signed)
MA unable to reach patient and no VM option. 

## 2020-05-25 ENCOUNTER — Telehealth: Payer: Self-pay | Admitting: *Deleted

## 2020-05-25 NOTE — Telephone Encounter (Signed)
-----   Message from Roney Jaffe, [place] sent at 05/22/2020  1:14 PM EST ----- Regarding: Travel vaccinations Please call patient and let them know that for travel to [place] the CDC does recommend that all routine vaccinations are up-to-date, vaccinations for hepatitis A and hepatitis B, as well as preventative medications for malaria, yellow fever and typhoid.  The malaria medication needed to be started approximately 6 weeks prior to travel and they are traveling at the beginning of next month.  I recommend that they call the health department for further guidance and return to clinic if needed

## 2020-05-25 NOTE — Telephone Encounter (Signed)
MA unable to reach patient via both numbers listed with no VM option on either. Please inform patient of blood work showing normal levels except for cholesterol. Please confirm if patient was fasting prior to recent test. Also inform patient of travel vaccine advice.for travel to [place] the CDC does recommend that all routine vaccinations are up-to-date, vaccinations for hepatitis A and hepatitis B, as well as preventative medications for malaria, yellow fever and typhoid. The malaria medication needed to be started approximately 6 weeks prior to travel and they are traveling at the beginning of next month. I recommend that they call the health department for further guidance and return to clinic if needed

## 2020-06-25 MED FILL — MEMANTINE HCL 10 MG TABS: 10 | 30 days supply | Qty: 60 | Fill #1

## 2020-06-25 MED FILL — glipiZIDE 5 MG TABS: 5 | 30 days supply | Qty: 60 | Fill #4

## 2020-06-27 ENCOUNTER — Telehealth: Payer: Self-pay | Admitting: Family Medicine

## 2020-06-27 MED FILL — MIRTAZAPINE 30 MG TABLET: 30 | 30 days supply | Qty: 30 | Fill #1

## 2020-06-27 MED FILL — TRUEplus LANCETS 28G MISC: 50 days supply | Qty: 100 | Fill #1

## 2020-06-27 MED FILL — METFORMIN HCL 500 MG TABS: 500 | 30 days supply | Qty: 120 | Fill #1

## 2020-06-27 MED FILL — TRUE METRIX GLUCOSE TEST ST: 25 days supply | Qty: 100 | Fill #1

## 2020-06-27 NOTE — Telephone Encounter (Signed)
Patient is aware of medications being at the pharmacy and available for pick up.

## 2020-06-27 NOTE — Telephone Encounter (Signed)
Patient daughter Meredith Ryan came to the office wanting to refill her mothers medication. The prescriptions was sent in on 05/21/20 by Cari but the pharmacy here never received the prescriptions. The patient is a patient of Dr. Alvis Lemmings and will be leaving to travel [place] on Jan 25th and is asking to receive enough medication for 3 months.The daughter Meredith Ryan would like to receive a call at [phone]. Please advise if her mother will be able to receive 3 months of the following medication  Memantine Glipizide True Metrix Glucose Test Strips Metformin Mirtazapine

## 2020-07-24 MED FILL — glipiZIDE 5 MG TABS: 5 | 30 days supply | Qty: 60 | Fill #5

## 2020-07-24 MED FILL — MEMANTINE HCL 10 MG TABS: 10 | 30 days supply | Qty: 60 | Fill #2

## 2020-07-30 ENCOUNTER — Encounter: Payer: Self-pay | Admitting: Physician Assistant

## 2020-07-30 ENCOUNTER — Ambulatory Visit (INDEPENDENT_AMBULATORY_CARE_PROVIDER_SITE_OTHER): Payer: Self-pay | Admitting: Physician Assistant

## 2020-07-30 ENCOUNTER — Other Ambulatory Visit: Payer: Self-pay

## 2020-07-30 ENCOUNTER — Other Ambulatory Visit: Payer: Self-pay | Admitting: Physician Assistant

## 2020-07-30 VITALS — BP 141/78 | HR 107 | Temp 97.3°F | Resp 18 | Ht 65.0 in | Wt 144.0 lb

## 2020-07-30 DIAGNOSIS — E1165 Type 2 diabetes mellitus with hyperglycemia: Secondary | ICD-10-CM

## 2020-07-30 DIAGNOSIS — G629 Polyneuropathy, unspecified: Secondary | ICD-10-CM

## 2020-07-30 MED ORDER — GABAPENTIN 100 MG PO CAPS: 100.0000 mg | ORAL_CAPSULE | Freq: Every day | ORAL | 3 refills | Status: DC

## 2020-07-30 MED FILL — GABAPENTIN 100 MG CAPSULE: 100 | 30 days supply | Qty: 30 | Fill #0

## 2020-07-30 NOTE — Progress Notes (Signed)
Established Patient Office Visit  Subjective:  Patient ID: Meredith Ryan, female    DOB: [birth year]  Age: 60 y.o. MRN: [id number]  CC:  Chief Complaint  Patient presents with   Leg Pain    Left    HPI Letoya Ryan states that she has been having lower left leg pain, for the last week, stabbing; hurts all the time ; will shift from the left to the right   Negative holmans; denies numbness and tingling   Does endorse a history of arthritis , denies injury or trauma, no falls   Daughter is present and helps with history, does endorse that pain has been present for longer than a week, but is unsure of time.  Due to language barrier, an interpreter was present during the history-taking and subsequent discussion (and for part of the physical exam) with this patient.   Past Medical History:  Diagnosis Date   Anemia    DM type 2 (diabetes mellitus, type 2) (HCC)    HLD (hyperlipidemia)     History reviewed. No pertinent surgical history.  Family History  Problem Relation Age of Onset   Diabetes Mother    Diabetes Father     Social History   Socioeconomic History   Marital status: Married    Spouse name: Not on file   Number of children: Not on file   Years of education: Not on file   Highest education level: Not on file  Occupational History   Not on file  Tobacco Use   Smoking status: Never Smoker   Smokeless tobacco: Never Used  Vaping Use   Vaping Use: Never used  Substance and Sexual Activity   Alcohol use: No   Drug use: No   Sexual activity: Yes  Other Topics Concern   Not on file  Social History Narrative   Lives with husband, 3 children. (23, 13, 51) Oldest daughter married.    Was working at [employer], but laid off.   Right hand   Social Determinants of Health   Financial Resource Strain: Not on file  Food Insecurity: Not on file  Transportation Needs: Not on file  Physical Activity: Not on file  Stress: Not on file   Social Connections: Not on file  Intimate Partner Violence: Not on file    Outpatient Medications Prior to Visit  Medication Sig Dispense Refill   glipiZIDE (GLUCOTROL) 5 MG tablet Take 1 tablet (5 mg total) by mouth 2 (two) times daily before a meal. 180 tablet 1   glucose blood (TRUE METRIX BLOOD GLUCOSE TEST) test strip Use as instructed 100 each 12   memantine (NAMENDA) 10 MG tablet Take 1 tablet every night for 2 weeks, then increase to 1 tablet in AM, 1 tablet in PM 60 tablet 11   metFORMIN (GLUCOPHAGE) 500 MG tablet Take 2 tablets (1,000 mg total) by mouth 2 (two) times daily with a meal. 180 tablet 1   mirtazapine (REMERON) 30 MG tablet Take 1 tablet (30 mg total) by mouth at bedtime. 30 tablet 1   TRUEplus Lancets 28G MISC 1 each by Does not apply route in the morning and at bedtime. 100 each 1   No facility-administered medications prior to visit.    No Known Allergies  ROS Review of Systems  Constitutional: Negative.   HENT: Negative.   Eyes: Negative.   Respiratory: Negative.   Cardiovascular: Negative.   Gastrointestinal: Negative.   Endocrine: Negative.   Genitourinary: Negative.   Musculoskeletal: Positive  for myalgias. Negative for gait problem and joint swelling.  Skin: Negative.   Allergic/Immunologic: Negative.   Neurological: Negative.   Hematological: Negative.   Psychiatric/Behavioral: Negative.       Objective:    Physical Exam Vitals and nursing note reviewed.  Constitutional:      Appearance: Normal appearance.  HENT:     Head: Normocephalic and atraumatic.     Right Ear: External ear normal.     Left Ear: External ear normal.     Nose: Nose normal.     Mouth/Throat:     Mouth: Mucous membranes are moist.     Pharynx: Oropharynx is clear.  Eyes:     Extraocular Movements: Extraocular movements intact.     Conjunctiva/sclera: Conjunctivae normal.     Pupils: Pupils are equal, round, and reactive to light.  Cardiovascular:      Rate and Rhythm: Normal rate and regular rhythm.     Pulses: Normal pulses.          Dorsalis pedis pulses are 2+ on the right side and 2+ on the left side.       Posterior tibial pulses are 2+ on the right side and 2+ on the left side.     Heart sounds: Normal heart sounds.  Pulmonary:     Effort: Pulmonary effort is normal.     Breath sounds: Normal breath sounds.  Musculoskeletal:        General: Normal range of motion.     Cervical back: Normal range of motion and neck supple.     Right lower leg: No edema.     Left lower leg: No edema.  Skin:    General: Skin is warm and dry.  Neurological:     General: No focal deficit present.     Mental Status: She is alert and oriented to person, place, and time.  Psychiatric:        Mood and Affect: Mood normal.        Behavior: Behavior normal.        Thought Content: Thought content normal.        Judgment: Judgment normal.     BP (!) 141/78 (BP Location: Right Arm, Patient Position: Sitting, Cuff Size: Normal)    Pulse (!) 107    Temp (!) 97.3 F (36.3 C) (Temporal)    Resp 18    Ht 5\' 5"  (1.651 m)    Wt 144 lb (65.3 kg)    SpO2 98%    BMI 23.96 kg/m  Wt Readings from Last 3 Encounters:  07/30/20 144 lb (65.3 kg)  05/21/20 146 lb (66.2 kg)  04/17/20 144 lb 6.4 oz (65.5 kg)     Health Maintenance Due  Topic Date Due   OPHTHALMOLOGY EXAM  03/21/2014   MAMMOGRAM  10/09/2017   FOOT EXAM  07/17/2020    There are no preventive care reminders to display for this patient.  Lab Results  Component Value Date   TSH 3.510 05/22/2020   Lab Results  Component Value Date   WBC 7.5 05/22/2020   HGB 13.6 05/22/2020   HCT 43.3 05/22/2020   MCV 78 (L) 05/22/2020   PLT 350 05/22/2020   Lab Results  Component Value Date   NA 136 05/22/2020   K 4.5 05/22/2020   CO2 23 01/13/2020   GLUCOSE 241 (H) 05/22/2020   BUN 10 05/22/2020   CREATININE 0.82 05/22/2020   BILITOT 0.4 05/22/2020   ALKPHOS 95 05/22/2020   AST  20 05/22/2020    ALT 16 01/13/2020   PROT 7.5 05/22/2020   ALBUMIN 4.5 05/22/2020   CALCIUM 9.5 05/22/2020   Lab Results  Component Value Date   CHOL 288 (H) 05/22/2020   Lab Results  Component Value Date   HDL 67 05/22/2020   Lab Results  Component Value Date   LDLCALC 202 (H) 05/22/2020   Lab Results  Component Value Date   TRIG 108 05/22/2020   Lab Results  Component Value Date   CHOLHDL 4.3 05/22/2020   Lab Results  Component Value Date   HGBA1C 9.5 (A) 05/21/2020      Assessment & Plan:   Problem List Items Addressed This Visit      Endocrine   Type 2 diabetes mellitus (HCC) - Primary     Nervous and Auditory   Neuropathy   Relevant Medications   gabapentin (NEURONTIN) 100 MG capsule    1. Type 2 diabetes mellitus with hyperglycemia, without long-term current use of insulin (HCC)  A1c May 21, 2020 was 9.5.  Daughter did have blood glucose readings available, however she was taking them shortly after patient was eating.  Patient education given on proper timing for blood glucose readings.  Patient to follow-up in 2 weeks in mobile medicine unit for repeat A1c, medication review and then will be given a warm handoff back to primary care provider  2. Neuropathy Alternating leg pain possibly neuropathy, encouraged to continue working on reducing blood glucose levels, trial gabapentin - gabapentin (NEURONTIN) 100 MG capsule; Take 1 capsule (100 mg total) by mouth at bedtime.  Dispense: 30 capsule; Refill: 3   I have reviewed the patient's medical history (PMH, PSH, Social History, Family History, Medications, and allergies) , and have been updated if relevant. I spent 30 minutes reviewing chart and  face to face time with patient.    Meds ordered this encounter  Medications   gabapentin (NEURONTIN) 100 MG capsule    Sig: Take 1 capsule (100 mg total) by mouth at bedtime.    Dispense:  30 capsule    Refill:  3    Order Specific Question:   Supervising Provider     Answer:   Storm Frisk [[id number]]    Follow-up: Return in about 2 weeks (around 08/13/2020).    Kasandra Knudsen Mayers, PA-C

## 2020-07-30 NOTE — Patient Instructions (Signed)
You will start taking gabapentin 100 mg at bedtime for the leg pain.  I encourage you to begin checking the blood sugar levels in the morning prior to eating.  We will see you in 2 weeks on the mobile medicine unit for further review.  Roney Jaffe, PA-C Physician Assistant [facility] [email or web address]    Neuropathic Pain Neuropathic pain is pain caused by damage to the nerves that are responsible for certain sensations in your body (sensory nerves). The pain can be caused by:  Damage to the sensory nerves that send signals to your spinal cord and brain (peripheral nervous system).  Damage to the sensory nerves in your brain or spinal cord (central nervous system). Neuropathic pain can make you more sensitive to pain. Even a minor sensation can feel very painful. This is usually a long-term condition that can be difficult to treat. The type of pain differs from person to person. It may:  Start suddenly (acute), or it may develop slowly and last for a long time (chronic).  Come and go as damaged nerves heal, or it may stay at the same level for years.  Cause emotional distress, loss of sleep, and a lower quality of life. What are the causes? The most common cause of this condition is diabetes. Many other diseases and conditions can also cause neuropathic pain. Causes of neuropathic pain can be classified as:  Toxic. This is caused by medicines and chemicals. The most common cause of toxic neuropathic pain is damage from cancer treatments (chemotherapy).  Metabolic. This can be caused by: ? Diabetes. This is the most common disease that damages the nerves. ? Lack of vitamin B from long-term alcohol abuse.  Traumatic. Any injury that cuts, crushes, or stretches a nerve can cause damage and pain. A common example is feeling pain after losing an arm or leg (phantom limb pain).  Compression-related. If a sensory nerve gets  trapped or compressed for a long period of time, the blood supply to the nerve can be cut off.  Vascular. Many blood vessel diseases can cause neuropathic pain by decreasing blood supply and oxygen to nerves.  Autoimmune. This type of pain results from diseases in which the body's defense system (immune system) mistakenly attacks sensory nerves. Examples of autoimmune diseases that can cause neuropathic pain include lupus and multiple sclerosis.  Infectious. Many types of viral infections can damage sensory nerves and cause pain. Shingles infection is a common cause of this type of pain.  Inherited. Neuropathic pain can be a symptom of many diseases that are passed down through families (genetic). What increases the risk? You are more likely to develop this condition if:  You have diabetes.  You smoke.  You drink too much alcohol.  You are taking certain medicines, including medicines that kill cancer cells (chemotherapy) or that treat immune system disorders. What are the signs or symptoms? The main symptom is pain. Neuropathic pain is often described as:  Burning.  Shock-like.  Stinging.  Hot or cold.  Itching. How is this diagnosed? No single test can diagnose neuropathic pain. It is diagnosed based on:  Physical exam and your symptoms. Your health care provider will ask you about your pain. You may be asked to use a pain scale to describe how bad your pain is.  Tests. These may be done to see if you have a high sensitivity to pain and to help find the cause and location of any sensory nerve damage. They include: ?  Nerve conduction studies to test how well nerve signals travel through your sensory nerves (electrodiagnostic testing). ? Stimulating your sensory nerves through electrodes on your skin and measuring the response in your spinal cord and brain (somatosensory evoked potential).  Imaging studies, such as: ? X-rays. ? CT scan. ? MRI. How is this  treated? Treatment for neuropathic pain may change over time. You may need to try different treatment options or a combination of treatments. Some options include:  Treating the underlying cause of the neuropathy, such as diabetes, kidney disease, or vitamin deficiencies.  Stopping medicines that can cause neuropathy, such as chemotherapy.  Medicine to relieve pain. Medicines may include: ? Prescription or over-the-counter pain medicine. ? Anti-seizure medicine. ? Antidepressant medicines. ? Pain-relieving patches that are applied to painful areas of skin. ? A medicine to numb the area (local anesthetic), which can be injected as a nerve block.  Transcutaneous nerve stimulation. This uses electrical currents to block painful nerve signals. The treatment is painless.  Alternative treatments, such as: ? Acupuncture. ? Meditation. ? Massage. ? Physical therapy. ? Pain management programs. ? Counseling. Follow these instructions at home: Medicines  Take over-the-counter and prescription medicines only as told by your health care provider.  Do not drive or use heavy machinery while taking prescription pain medicine.  If you are taking prescription pain medicine, take actions to prevent or treat constipation. Your health care provider may recommend that you: ? Drink enough fluid to keep your urine pale yellow. ? Eat foods that are high in fiber, such as fresh fruits and vegetables, whole grains, and beans. ? Limit foods that are high in fat and processed sugars, such as fried or sweet foods. ? Take an over-the-counter or prescription medicine for constipation.   Lifestyle  Have a good support system at home.  Consider joining a chronic pain support group.  Do not use any products that contain nicotine or tobacco, such as cigarettes and e-cigarettes. If you need help quitting, ask your health care provider.  Do not drink alcohol.   General instructions  Learn as much as you can  about your condition.  Work closely with all your health care providers to find the treatment plan that works best for you.  Ask your health care provider what activities are safe for you.  Keep all follow-up visits as told by your health care provider. This is important. Contact a health care provider if:  Your pain treatments are not working.  You are having side effects from your medicines.  You are struggling with tiredness (fatigue), mood changes, depression, or anxiety. Summary  Neuropathic pain is pain caused by damage to the nerves that are responsible for certain sensations in your body (sensory nerves).  Neuropathic pain may come and go as damaged nerves heal, or it may stay at the same level for years.  Neuropathic pain is usually a long-term condition that can be difficult to treat. Consider joining a chronic pain support group. This information is not intended to replace advice given to you by your health care provider. Make sure you discuss any questions you have with your health care provider. Document Revised: 09/23/2018 Document Reviewed: 06/19/2017 Elsevier Patient Education  2021 [employer].

## 2020-07-30 NOTE — Progress Notes (Signed)
Patient has taken medication today and patient has eaten today. Patient complains of LL pain during exercise described as sharp pains. Patient shares arthritis in the left hip and knee. Patient denies pain at rest.

## 2020-08-13 ENCOUNTER — Telehealth: Payer: Self-pay

## 2020-08-13 NOTE — Telephone Encounter (Signed)
Contacted patient at phone numbers provided in the profile 2x to share location of the [facility] for upcoming appt. Was able to LVM on daughter's phone with information regarding location of the [facility].

## 2020-08-14 ENCOUNTER — Ambulatory Visit: Payer: Self-pay

## 2020-09-15 ENCOUNTER — Other Ambulatory Visit: Payer: Self-pay

## 2020-10-09 ENCOUNTER — Other Ambulatory Visit: Payer: Self-pay

## 2020-10-11 ENCOUNTER — Other Ambulatory Visit: Payer: Self-pay

## 2020-10-11 MED FILL — Memantine HCl Tab 10 MG: ORAL | 60 days supply | Qty: 120 | Fill #0 | Status: AC

## 2020-10-11 MED FILL — Metformin HCl Tab 500 MG: ORAL | 30 days supply | Qty: 120 | Fill #0 | Status: AC

## 2020-10-22 ENCOUNTER — Ambulatory Visit: Payer: Self-pay | Admitting: Neurology

## 2021-04-08 ENCOUNTER — Telehealth: Payer: Self-pay | Admitting: Neurology

## 2021-04-08 NOTE — Telephone Encounter (Signed)
Patient needs a refill on her medication namenda  Patient has appt with Karel Jarvis on 09-09-21

## 2021-04-09 ENCOUNTER — Other Ambulatory Visit: Payer: Self-pay

## 2021-04-09 MED ORDER — MEMANTINE HCL 10 MG PO TABS
10.0000 mg | ORAL_TABLET | Freq: Two times a day (BID) | ORAL | 4 refills | Status: DC
  Filled 2021-04-09 – 2021-05-06 (×2): qty 60, 30d supply, fill #0

## 2021-04-09 NOTE — Telephone Encounter (Signed)
Refill sent to last until appointment

## 2021-04-16 ENCOUNTER — Other Ambulatory Visit: Payer: Self-pay

## 2021-05-06 ENCOUNTER — Encounter: Payer: Self-pay | Admitting: Family Medicine

## 2021-05-06 ENCOUNTER — Other Ambulatory Visit: Payer: Self-pay

## 2021-05-06 ENCOUNTER — Ambulatory Visit: Payer: Self-pay | Attending: Family Medicine | Admitting: Family Medicine

## 2021-05-06 VITALS — BP 149/89 | HR 81 | Ht 63.0 in | Wt 145.2 lb

## 2021-05-06 DIAGNOSIS — E1165 Type 2 diabetes mellitus with hyperglycemia: Secondary | ICD-10-CM

## 2021-05-06 DIAGNOSIS — G4709 Other insomnia: Secondary | ICD-10-CM

## 2021-05-06 DIAGNOSIS — I10 Essential (primary) hypertension: Secondary | ICD-10-CM

## 2021-05-06 LAB — GLUCOSE, POCT (MANUAL RESULT ENTRY)
POC Glucose: 478 mg/dl — AB (ref 70–99)
POC Glucose: 436 mg/dl — AB (ref 70–99)

## 2021-05-06 LAB — POCT GLYCOSYLATED HEMOGLOBIN (HGB A1C): HbA1c, POC (controlled diabetic range): 11.1 % — AB (ref 0.0–7.0)

## 2021-05-06 MED ORDER — MIRTAZAPINE 30 MG PO TABS
ORAL_TABLET | ORAL | 6 refills | Status: DC
  Filled 2021-05-06: qty 30, 30d supply, fill #0

## 2021-05-06 MED ORDER — GLIPIZIDE 5 MG PO TABS
5.0000 mg | ORAL_TABLET | Freq: Two times a day (BID) | ORAL | 1 refills | Status: DC
  Filled 2021-05-06: qty 60, 30d supply, fill #0
  Filled 2021-06-11: qty 60, 30d supply, fill #1
  Filled 2021-07-19: qty 60, 30d supply, fill #0
  Filled 2021-11-06: qty 180, 90d supply, fill #1

## 2021-05-06 MED ORDER — TRUE METRIX GO GLUCOSE METER W/DEVICE KIT
PACK | 0 refills | Status: DC
  Filled 2021-05-06: qty 1, 30d supply, fill #0

## 2021-05-06 MED ORDER — INSULIN ASPART 100 UNIT/ML IJ SOLN
20.0000 [IU] | Freq: Once | INTRAMUSCULAR | Status: AC
Start: 2021-05-06 — End: 2021-05-06
  Administered 2021-05-06: 20 [IU] via SUBCUTANEOUS

## 2021-05-06 MED ORDER — GLUCOSE BLOOD VI STRP
ORAL_STRIP | 12 refills | Status: DC
  Filled 2021-05-06: qty 100, 25d supply, fill #0

## 2021-05-06 MED ORDER — METFORMIN HCL 500 MG PO TABS
ORAL_TABLET | Freq: Two times a day (BID) | ORAL | 1 refills | Status: DC
  Filled 2021-05-06: qty 60, 30d supply, fill #0
  Filled 2021-06-03: qty 120, 30d supply, fill #1
  Filled 2021-06-03: qty 60, 30d supply, fill #1
  Filled 2021-07-15: qty 120, 30d supply, fill #2
  Filled 2021-07-16: qty 120, 30d supply, fill #0
  Filled 2021-11-06: qty 60, 15d supply, fill #1

## 2021-05-06 MED ORDER — ATORVASTATIN CALCIUM 20 MG PO TABS
20.0000 mg | ORAL_TABLET | Freq: Every day | ORAL | 6 refills | Status: DC
  Filled 2021-05-06: qty 30, 30d supply, fill #0
  Filled 2021-06-03: qty 30, 30d supply, fill #1
  Filled 2021-07-01: qty 30, 30d supply, fill #0
  Filled 2021-07-01: qty 30, 30d supply, fill #2
  Filled 2021-07-19: qty 30, 30d supply, fill #0
  Filled 2021-11-06: qty 90, 90d supply, fill #1
  Filled 2022-02-04: qty 30, 30d supply, fill #2

## 2021-05-06 NOTE — Progress Notes (Signed)
Subjective:  Patient ID: Meredith Ryan, female    DOB: [birth year]  Age: 60 y.o. MRN: [id number]  CC: Diabetes   HPI Meredith Ryan is a 60 y.o. year old female with a history of type 2 diabetes mellitus (A1c 11.1), hyperlipidemia seen for follow-up visit today accompanied by her daughter  Interval History: She has been out of her medications  for the last 2-3 months as [place] and was unable to obtain a refill.  A1c is 11.1 up from 9.5 previously and prior to that was 7.4 Endorses presence of dizziness, CBG is 478 today. Blood pressure is also 149/89. She denies presence of blurry vision, chest pain, dyspnea. Past Medical History:  Diagnosis Date   Anemia    DM type 2 (diabetes mellitus, type 2) (HCC)    HLD (hyperlipidemia)     History reviewed. No pertinent surgical history.  Family History  Problem Relation Age of Onset   Diabetes Mother    Diabetes Father     No Known Allergies  Outpatient Medications Prior to Visit  Medication Sig Dispense Refill   memantine (NAMENDA) 10 MG tablet Take 1 tablet (10 mg total) by mouth 2 (two) times daily. 60 tablet 4   TRUEplus Lancets 28G MISC USE AS DIRECTED IN THE MORNING AND AT BEDTIME. 100 each 1   Blood Glucose Monitoring Suppl (TRUE METRIX GO GLUCOSE METER) w/Device KIT USE AS DIRECTED IN THE MORNING AND AT BEDTIME. 1 kit 0   gabapentin (NEURONTIN) 100 MG capsule TAKE 1 CAPSULE (100 MG TOTAL) BY MOUTH AT BEDTIME. 30 capsule 3   glipiZIDE (GLUCOTROL) 5 MG tablet Take 1 tablet (5 mg total) by mouth 2 (two) times daily before a meal. 180 tablet 1   glucose blood test strip USE AS INSTRUCTED 100 strip 12   metFORMIN (GLUCOPHAGE) 500 MG tablet TAKE 2 TABLETS (1,000 MG TOTAL) BY MOUTH 2 (TWO) TIMES DAILY WITH A MEAL. 180 tablet 1   mirtazapine (REMERON) 30 MG tablet TAKE 1 TABLET (30 MG TOTAL) BY MOUTH AT BEDTIME. 30 tablet 1   No facility-administered medications prior to visit.     ROS Review of Systems   Constitutional:  Negative for activity change, appetite change and fatigue.  HENT:  Negative for congestion, sinus pressure and sore throat.   Eyes:  Negative for visual disturbance.  Respiratory:  Negative for cough, chest tightness, shortness of breath and wheezing.   Cardiovascular:  Negative for chest pain and palpitations.  Gastrointestinal:  Negative for abdominal distention, abdominal pain and constipation.  Endocrine: Negative for polydipsia.  Genitourinary:  Negative for dysuria and frequency.  Musculoskeletal:  Negative for arthralgias and back pain.  Skin:  Negative for rash.  Neurological:  Positive for dizziness. Negative for tremors, light-headedness and numbness.  Hematological:  Does not bruise/bleed easily.  Psychiatric/Behavioral:  Negative for agitation and behavioral problems.    Objective:  BP (!) 149/89   Pulse 81   Ht 5' 3"  (1.6 m)   Wt 145 lb 3.2 oz (65.9 kg)   SpO2 95%   BMI 25.72 kg/m   BP/Weight 05/06/2021 07/30/2020 67/10/9161  Systolic BP 846 659 935  Diastolic BP 89 78 80  Wt. (Lbs) 145.2 144 146  BMI 25.72 23.96 24.3      Physical Exam Constitutional:      Appearance: She is well-developed.  Cardiovascular:     Rate and Rhythm: Normal rate.     Heart sounds: Normal heart sounds. No murmur  heard. Pulmonary:     Effort: Pulmonary effort is normal.     Breath sounds: Normal breath sounds. No wheezing or rales.  Chest:     Chest wall: No tenderness.  Abdominal:     General: Bowel sounds are normal. There is no distension.     Palpations: Abdomen is soft. There is no mass.     Tenderness: There is no abdominal tenderness.  Musculoskeletal:        General: Normal range of motion.     Right lower leg: No edema.     Left lower leg: No edema.  Neurological:     Mental Status: She is alert and oriented to person, place, and time.  Psychiatric:        Mood and Affect: Mood normal.   Diabetic Foot Exam - Simple   Simple Foot Form Diabetic  Foot exam was performed with the following findings: Yes 05/06/2021  3:30 PM  Visual Inspection No deformities, no ulcerations, no other skin breakdown bilaterally: Yes Sensation Testing Intact to touch and monofilament testing bilaterally: Yes Pulse Check Posterior Tibialis and Dorsalis pulse intact bilaterally: Yes Comments     CMP Latest Ref Rng & Units 05/22/2020 01/13/2020 04/04/2019  Glucose 65 - 99 mg/dL 241(H) 179(H) 137(H)  BUN 8 - 27 mg/dL 10 10 10   Creatinine 0.57 - 1.00 mg/dL 0.82 0.85 0.73  Sodium 134 - 144 mmol/L 136 139 138  Potassium 3.5 - 5.2 mmol/L 4.5 4.8 4.8  Chloride 96 - 106 mmol/L 99 99 99  CO2 20 - 29 mmol/L - 23 22  Calcium 8.7 - 10.3 mg/dL 9.5 9.7 9.7  Total Protein 6.0 - 8.5 g/dL 7.5 7.8 7.9  Total Bilirubin 0.0 - 1.2 mg/dL 0.4 0.5 0.2  Alkaline Phos 44 - 121 IU/L 95 81 100  AST 0 - 40 IU/L 20 20 25   ALT 0 - 32 IU/L - 16 17    Lipid Panel     Component Value Date/Time   CHOL 288 (H) 05/22/2020 0906   TRIG 108 05/22/2020 0906   HDL 67 05/22/2020 0906   CHOLHDL 4.3 05/22/2020 0906   CHOLHDL 3.1 10/01/2015 1505   VLDL 34 (H) 10/01/2015 1505   LDLCALC 202 (H) 05/22/2020 0906    CBC    Component Value Date/Time   WBC 7.5 05/22/2020 0906   WBC 7.4 07/21/2016 1606   RBC 5.54 (H) 05/22/2020 0906   RBC 5.27 (H) 07/21/2016 1606   HGB 13.6 05/22/2020 0906   HCT 43.3 05/22/2020 0906   PLT 350 05/22/2020 0906   MCV 78 (L) 05/22/2020 0906   MCH 24.5 (L) 05/22/2020 0906   MCH 24.5 (L) 07/21/2016 1606   MCHC 31.4 (L) 05/22/2020 0906   MCHC 31.9 (L) 07/21/2016 1606   RDW 15.5 (H) 05/22/2020 0906   LYMPHSABS 3.0 05/22/2020 0906   MONOABS 518 07/21/2016 1606   EOSABS 0.3 05/22/2020 0906   BASOSABS 0.1 05/22/2020 0906    Lab Results  Component Value Date   HGBA1C 11.1 (A) 05/06/2021    Assessment & Plan:  1. Type 2 diabetes mellitus with hyperglycemia, without long-term current use of insulin (HCC) Uncontrolled with A1c of 11.1; goal is less  than 7.0 She was previously 7.4 then 9.0 and now 11.1 NovoLog 20 units administered due to CBG of 478 and oral hydration provided I have refilled medications and will review blood sugar log in 1 month Consider increasing dose of glipizide and adding an SGLT2 inhibitor to her  regimen Unfortunately GLP-1 will not be an option as she is concerned about weight loss Will also need to discuss possibility of insulin down the road if glycemic control is not optimal Counseled on Diabetic diet, my plate method, 425 minutes of moderate intensity exercise/week Blood sugar logs with fasting goals of 80-120 mg/dl, random of less than 180 and in the event of sugars less than 60 mg/dl or greater than 400 mg/dl encouraged to notify the clinic. Advised on the need for annual eye exams, annual foot exams, Pneumonia vaccine.  - POCT glucose (manual entry) - POCT glycosylated hemoglobin (Hb A1C) - glipiZIDE (GLUCOTROL) 5 MG tablet; Take 1 tablet (5 mg total) by mouth 2 (two) times daily before a meal.  Dispense: 180 tablet; Refill: 1 - metFORMIN (GLUCOPHAGE) 500 MG tablet; TAKE 2 TABLETS (1,000 MG TOTAL) BY MOUTH 2 (TWO) TIMES DAILY WITH A MEAL.  Dispense: 180 tablet; Refill: 1 - Microalbumin / creatinine urine ratio - CMP14+EGFR - atorvastatin (LIPITOR) 20 MG tablet; Take 1 tablet (20 mg total) by mouth daily.  Dispense: 30 tablet; Refill: 6 - glucose blood test strip; USE AS INSTRUCTED  Dispense: 100 strip; Refill: 12 - Blood Glucose Monitoring Suppl (TRUE METRIX GO GLUCOSE METER) w/Device KIT; USE AS DIRECTED IN THE MORNING AND AT BEDTIME.  Dispense: 1 kit; Refill: 0 - insulin aspart (novoLOG) injection 20 Units - POCT glucose (manual entry)  2. Other insomnia Uncontrolled She has been out of Remeron which I have refilled. - mirtazapine (REMERON) 30 MG tablet; TAKE 1 TABLET (30 MG TOTAL) BY MOUTH AT BEDTIME.  Dispense: 30 tablet; Refill: 6  3. Essential hypertension Elevated blood pressure If elevated at  next visit consider addition of an antihypertensive Counseled on blood pressure goal of less than 130/80, low-sodium, DASH diet, medication compliance, 150 minutes of moderate intensity exercise per week. Discussed medication compliance, adverse effects.    Meds ordered this encounter  Medications   glipiZIDE (GLUCOTROL) 5 MG tablet    Sig: Take 1 tablet (5 mg total) by mouth 2 (two) times daily before a meal.    Dispense:  180 tablet    Refill:  1   metFORMIN (GLUCOPHAGE) 500 MG tablet    Sig: TAKE 2 TABLETS (1,000 MG TOTAL) BY MOUTH 2 (TWO) TIMES DAILY WITH A MEAL.    Dispense:  180 tablet    Refill:  1   mirtazapine (REMERON) 30 MG tablet    Sig: TAKE 1 TABLET (30 MG TOTAL) BY MOUTH AT BEDTIME.    Dispense:  30 tablet    Refill:  6   atorvastatin (LIPITOR) 20 MG tablet    Sig: Take 1 tablet (20 mg total) by mouth daily.    Dispense:  30 tablet    Refill:  6   glucose blood test strip    Sig: USE AS INSTRUCTED    Dispense:  100 strip    Refill:  12   Blood Glucose Monitoring Suppl (TRUE METRIX GO GLUCOSE METER) w/Device KIT    Sig: USE AS DIRECTED IN THE MORNING AND AT BEDTIME.    Dispense:  1 kit    Refill:  0   insulin aspart (novoLOG) injection 20 Units    Follow-up: Return in about 1 month (around 06/05/2021) for follow up on DM.       Charlott Rakes, MD, FAAFP. [facility] and [place], [place]   05/06/2021, 5:05 PM

## 2021-05-06 NOTE — Patient Instructions (Signed)

## 2021-05-07 LAB — CMP14+EGFR
ALT: 15 IU/L (ref 0–32)
AST: 20 IU/L (ref 0–40)
Albumin/Globulin Ratio: 1.3 (ref 1.2–2.2)
Albumin: 4.5 g/dL (ref 3.8–4.8)
Alkaline Phosphatase: 118 IU/L (ref 44–121)
BUN/Creatinine Ratio: 13 (ref 12–28)
BUN: 10 mg/dL (ref 8–27)
Bilirubin Total: <0.2 mg/dL (ref 0.0–1.2)
CO2: 24 mmol/L (ref 20–29)
Calcium: 9.4 mg/dL (ref 8.7–10.3)
Chloride: 94 mmol/L — ABNORMAL LOW (ref 96–106)
Creatinine, Ser: 0.77 mg/dL (ref 0.57–1.00)
Globulin, Total: 3.4 g/dL (ref 1.5–4.5)
Glucose: 398 mg/dL — ABNORMAL HIGH (ref 70–99)
Potassium: 5 mmol/L (ref 3.5–5.2)
Sodium: 131 mmol/L — ABNORMAL LOW (ref 134–144)
Total Protein: 7.9 g/dL (ref 6.0–8.5)
eGFR: 87 mL/min/{1.73_m2} (ref >59)

## 2021-05-07 LAB — MICROALBUMIN / CREATININE URINE RATIO
Creatinine, Urine: 36.1 mg/dL
Microalb/Creat Ratio: <8 mg/g creat (ref 0–29)
Microalbumin, Urine: <3 ug/mL

## 2021-05-08 ENCOUNTER — Telehealth: Payer: Self-pay

## 2021-05-08 NOTE — Telephone Encounter (Signed)
-----   Message from Hoy Register, MD sent at 05/07/2021  5:52 PM EST ----- Please inform her that labs revealed elevated glucose and she is advised to continue with diabetes regimen we discussed at her last visit.

## 2021-05-08 NOTE — Telephone Encounter (Signed)
Patient name and DOB has been verified Patient was informed of lab results. Patient had no questions.  

## 2021-05-16 ENCOUNTER — Ambulatory Visit: Payer: Self-pay

## 2021-05-16 NOTE — Telephone Encounter (Signed)
Pt.'s daughter, Norville Haggard, interpreting for pt. Has left leg pain that started 3 days ago. Hurts from the knee down to her foot. No redness or swelling. No known injury. Asking to be seen as soon as possible. Please advise daughter. Call back number [phone].    Answer Assessment - Initial Assessment Questions 1. ONSET: "When did the pain start?"      3 days ago 2. LOCATION: "Where is the pain located?"      Left calf and foot 3. PAIN: "How bad is the pain?"    (Scale 1-10; or mild, moderate, severe)   -  MILD (1-3): doesn't interfere with normal activities    -  MODERATE (4-7): interferes with normal activities (e.g., work or school) or awakens from sleep, limping    -  SEVERE (8-10): excruciating pain, unable to do any normal activities, unable to walk     10 4. WORK OR EXERCISE: "Has there been any recent work or exercise that involved this part of the body?"      No 5. CAUSE: "What do you think is causing the leg pain?"     Unsure 6. OTHER SYMPTOMS: "Do you have any other symptoms?" (e.g., chest pain, back pain, breathing difficulty, swelling, rash, fever, numbness, weakness)     No 7. PREGNANCY: "Is there any chance you are pregnant?" "When was your last menstrual period?"     No  Protocols used: Leg Pain-A-AH

## 2021-05-21 NOTE — Telephone Encounter (Signed)
Left vm on daughter phone to call [phone] if appt is still needed.

## 2021-06-03 ENCOUNTER — Other Ambulatory Visit: Payer: Self-pay

## 2021-06-06 ENCOUNTER — Other Ambulatory Visit: Payer: Self-pay

## 2021-06-06 ENCOUNTER — Encounter: Payer: Self-pay | Admitting: Physician Assistant

## 2021-06-06 ENCOUNTER — Ambulatory Visit: Payer: Self-pay | Attending: Physician Assistant | Admitting: Physician Assistant

## 2021-06-06 VITALS — BP 133/84 | HR 107 | Ht 63.0 in | Wt 145.2 lb

## 2021-06-06 DIAGNOSIS — E1165 Type 2 diabetes mellitus with hyperglycemia: Secondary | ICD-10-CM

## 2021-06-06 DIAGNOSIS — E871 Hypo-osmolality and hyponatremia: Secondary | ICD-10-CM

## 2021-06-06 LAB — GLUCOSE, POCT (MANUAL RESULT ENTRY): POC Glucose: 172 mg/dl — AB (ref 70–99)

## 2021-06-06 NOTE — Progress Notes (Signed)
Patient ID: Meredith Ryan, female   DOB: [birth year], 60 y.o.   MRN: [id number]   Meredith Ryan, is a 60 y.o. female  [id number]  [id number]  DOB - May 28, 1961  Chief Complaint  Patient presents with   Diabetes       Subjective:   Meredith Ryan is a 60 y.o. female here today for a follow up visit and diabetes recheck.  Her son is with her translating.  [employer] interpter refused.  Her blood sugars have been running under 150 fasting.  She is compliant with her meds and she is changing her diet.    No problems updated.  ALLERGIES: No Known Allergies  PAST MEDICAL HISTORY: Past Medical History:  Diagnosis Date   Anemia    DM type 2 (diabetes mellitus, type 2) (HCC)    HLD (hyperlipidemia)     MEDICATIONS AT HOME: Prior to Admission medications   Medication Sig Start Date End Date Taking? Authorizing Provider  atorvastatin (LIPITOR) 20 MG tablet Take 1 tablet (20 mg total) by mouth daily. 05/06/21  Yes Charlott Rakes, MD  Blood Glucose Monitoring Suppl (TRUE METRIX GO GLUCOSE METER) w/Device KIT USE AS DIRECTED IN THE MORNING AND AT BEDTIME. 05/06/21 05/06/22 Yes Newlin, Enobong, MD  glipiZIDE (GLUCOTROL) 5 MG tablet Take 1 tablet (5 mg total) by mouth 2 (two) times daily before a meal. 05/06/21  Yes Newlin, Enobong, MD  glucose blood test strip USE AS INSTRUCTED 05/06/21 05/06/22 Yes Charlott Rakes, MD  memantine (NAMENDA) 10 MG tablet Take 1 tablet (10 mg total) by mouth 2 (two) times daily. 04/09/21 04/09/22 Yes Cameron Sprang, MD  metFORMIN (GLUCOPHAGE) 500 MG tablet TAKE 2 TABLETS (1,000 MG TOTAL) BY MOUTH 2 (TWO) TIMES DAILY WITH A MEAL. 05/06/21 05/06/22 Yes Newlin, Charlane Ferretti, MD  mirtazapine (REMERON) 30 MG tablet TAKE 1 TABLET (30 MG TOTAL) BY MOUTH AT BEDTIME. 05/06/21 05/06/22 Yes Newlin, Enobong, MD    ROS: Neg HEENT Neg resp Neg cardiac Neg GI Neg GU Neg MS Neg psych Neg neuro  Objective:   Vitals:   06/06/21 0856  BP: 133/84  Pulse: (!) 107   SpO2: 98%  Weight: 145 lb 3.2 oz (65.9 kg)  Height: _0  (1.6 m)   Exam General appearance : Awake, alert, not in any distress. Speech Clear. Not toxic looking HEENT: Atraumatic and Normocephalic Neck: Supple, no JVD. No cervical lymphadenopathy.  Chest: Good air entry bilaterally, CTAB.  No rales/rhonchi/wheezing CVS: S1 S2 regular, no murmurs.  Extremities: B/L Lower Ext shows no edema, both legs are warm to touch Neurology: Awake alert, and oriented X 3, CN II-XII intact, Non focal Skin: No Rash  Data Review Lab Results  Component Value Date   HGBA1C 11.1 (A) 05/06/2021   HGBA1C 9.5 (A) 05/21/2020   HGBA1C 7.4 (A) 01/12/2020    Assessment & Plan   1. Type 2 diabetes mellitus with hyperglycemia, without long-term current use of insulin ([place]) Improving!!!  Continue to check blood sugars.  As long as blood sugars running less than 150, f/up in 3 months.  If >200 make appt sooner; otherwise f/up 3 months - Glucose (CBG) - Basic metabolic panel  2. Hyponatremia - Basic metabolic panel   Patient have been counseled extensively about nutrition and exercise. Other issues discussed during this visit include: low cholesterol diet, weight control and daily exercise, foot care, annual eye examinations at Ophthalmology, importance of adherence with medications and regular follow-up. We also discussed long term complications of uncontrolled diabetes and hypertension.  Return in about 3 months (around 09/04/2021) for PCP for chronic conditions.  The patient was given clear instructions to go to ER or return to medical center if symptoms don't improve, worsen or new problems develop. The patient verbalized understanding. The patient was told to call to get lab results if they haven't heard anything in the next week.      Freeman Caldron, PA-C [facility] and [facility], [place]   06/06/2021, 9:21 AM

## 2021-06-07 LAB — BASIC METABOLIC PANEL
BUN/Creatinine Ratio: 12 (ref 12–28)
BUN: 9 mg/dL (ref 8–27)
CO2: 22 mmol/L (ref 20–29)
Calcium: 9.8 mg/dL (ref 8.7–10.3)
Chloride: 101 mmol/L (ref 96–106)
Creatinine, Ser: 0.75 mg/dL (ref 0.57–1.00)
Glucose: 217 mg/dL — ABNORMAL HIGH (ref 70–99)
Potassium: 5.1 mmol/L (ref 3.5–5.2)
Sodium: 137 mmol/L (ref 134–144)
eGFR: 90 mL/min/{1.73_m2} (ref >59)

## 2021-06-12 ENCOUNTER — Other Ambulatory Visit: Payer: Self-pay

## 2021-06-13 ENCOUNTER — Other Ambulatory Visit: Payer: Self-pay

## 2021-06-13 ENCOUNTER — Encounter: Payer: Self-pay | Admitting: Physician Assistant

## 2021-06-13 ENCOUNTER — Ambulatory Visit (INDEPENDENT_AMBULATORY_CARE_PROVIDER_SITE_OTHER): Payer: Self-pay | Admitting: Physician Assistant

## 2021-06-13 VITALS — BP 120/74 | HR 111 | Resp 20 | Ht 63.0 in | Wt 144.0 lb

## 2021-06-13 DIAGNOSIS — G300 Alzheimer's disease with early onset: Secondary | ICD-10-CM

## 2021-06-13 DIAGNOSIS — F0280 Dementia in other diseases classified elsewhere without behavioral disturbance: Secondary | ICD-10-CM

## 2021-06-13 MED ORDER — MEMANTINE HCL 10 MG PO TABS
10.0000 mg | ORAL_TABLET | Freq: Two times a day (BID) | ORAL | 11 refills | Status: DC
  Filled 2021-06-13 – 2021-07-19 (×2): qty 60, 30d supply, fill #0
  Filled 2021-11-06: qty 180, 90d supply, fill #1
  Filled 2022-02-04: qty 60, 30d supply, fill #2

## 2021-06-13 NOTE — Progress Notes (Signed)
Assessment/Plan:    Early onset Alzheimer's dementia without behavioral disturbance   Recommendations:  Discussed safety both in and out of the home.  Discussed the importance of regular daily schedule with inclusion of crossword puzzles to maintain brain function.  Continue to monitor mood by PCP.   Stay active at least 30 minutes at least 3 times a week.  Naps should be scheduled and should be no longer than 60 minutes and should not occur after 2 PM.  Mediterranean diet is recommended  Continue memantine 10 mg twice daily side effects were discussed Follow up in 6  months.   Case discussed with Dr. Delice Lesch who agrees with the plan   Subjective:    Katilin Raynes is a very pleasant 60 y.o. RH female with a history of diet-controlled diabetes, hyperlipidemia and dementia, seen today in follow up for memory loss.  She was last seen on 04/17/2020 at which time memantine was started at 10 mg twice daily, tolerating well.  MRI of the brain was never performed, patient declined.  She is here in a follow-up visit.  This patient is accompanied in the office by her son who supplements the history.  Previous records as well as any outside records available were reviewed prior to todays visit.  During her last presentation, she had difficulty completing MMSE appeared both from language barrier and memory (difficulty despite translating).  Patient continues to decline the testing.   Her son states that her memory may be worse, asking the same questions.  She does not repeat the same stories.  She spends most of the day watching TV, and very quiet.  He believes that she is "choosing to not interact, because of her language barrier ", with a component of her memory difficulties.  She does not like to participate in any activities.  There are no changes in her mood.  She does forget where the objects were left, but does not leave any unusual places.  At times, she can become defensive if confronted with  her memory issues.  She does not drive.  She lives with her son.  She sleeps well, denies any vivid dreams or sleepwalking.  No hallucinations or paranoia reported.  There are some hygiene concerns.  Sometimes her sister asked her to take a shower, and then she comes out of the bathroom and, the sister notes that she did not do it, because the bathroom is dry.  At times, she may be placing one garment over the other.  She still likes to clean the house.  Her medications are in a pillbox, and her son provides them.  Her son is in charge of the finances.  Her appetite is good, denies any trouble swallowing.  She does not cook.  She ambulates without difficulties, without any falls or head injuries.  Denies headaches, double vision, dizziness, focal numbness or tingling, unilateral weakness, tremors or anosmia. No history of seizures. Denies urine incontinence, retention, constipation or diarrhea.     Initial visit 04/17/20: This is a pleasant 60 year old right-handed woman with a history of diabetes, diet-controlled hyperlipidemia, presenting for evaluation of dementia. Her daughter Cleone Slim provides information and helps with translation. Records reviewed indicate that family reported memory concerns to her PCP in October 2020, misplacing things frequently. One time she gave him money to keep then forgot about it. MMSE 15/30. She reported forgetfulness during her PCP visit in 12/2019, forgetting names.She feels her memory is good. Rafa started noticing minor changes around  3 years ago, progressively worsening 2 years ago where she was forgetting her celphone in the fridge, or forgetting Rafa's name, having to go through all her siblings names to get to [employer]. She lives with her son and daughter. She manages her own medications. Her son manages finances. She does not drive. They moved from [place] to [place] in 2006. She learned some English once she moved here, but still has difficulty with the language, Rafa has to translate  several times and clarify questions during the visit. 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 Rafa reports that she forgets to eat meals if no one prepares it for her. She would eat what is in front of her, but otherwise would not prepare food. She forgets instructions, Rafa has to be with her with every step. She used to like to cook, but stopped a few years ago, saying "I just don't want to do it." She still likes to clean the house. She continues to do crochet work but after 10 minutes says she is bored. She needs help choosing her clothes, asking what she should put on. She is independent with bathing. She has been on Memantine 66m daily since July without side effects.   She has occasional lightheadedness when standing and some blurred vision. Rafa has noticed her lips and eyes twitch sometimes. She gets headaches when she tries to concentrate. She gets scared/nervous about answering in [place] She is able to speak in Arabic and her other dialect. Rafa has noticed that when she remembers her parents, she starts crying, which is new. No hallucinations. She denies any dysarthria/dysphagia, neck/back pain, focal numbness/tingling/weakness, bowel/bladder dysfunction, anosmia, or tremors. Her mother had Alzheimer's disease in her late 530s No history of significant head injuries or alcohol use.   Laboratory Data: Recent Labs       Lab Results  Component Value Date    TSH 2.390 04/04/2019      Recent Labs       Lab Results  Component Value Date    [id number] 67301/30/2014      Recent Labs[] Expand by Default       Lab Results  Component Value Date    HGBA1C 7.4 (A) 01/12/2020        PREVIOUS MEDICATIONS:   CURRENT MEDICATIONS:  Outpatient Encounter Medications as of 06/13/2021  Medication Sig   atorvastatin (LIPITOR) 20 MG tablet Take 1 tablet (20 mg total) by  mouth daily.   Blood Glucose Monitoring Suppl (TRUE METRIX GO GLUCOSE METER) w/Device KIT USE AS DIRECTED IN THE MORNING AND AT BEDTIME.   glipiZIDE (GLUCOTROL) 5 MG tablet Take 1 tablet (5 mg total) by mouth 2 (two) times daily before a meal.   glucose blood test strip USE AS INSTRUCTED   metFORMIN (GLUCOPHAGE) 500 MG tablet TAKE 2 TABLETS (1,000 MG TOTAL) BY MOUTH 2 (TWO) TIMES DAILY WITH A MEAL.   mirtazapine (REMERON) 30 MG tablet TAKE 1 TABLET (30 MG TOTAL) BY MOUTH AT BEDTIME.   [DISCONTINUED] memantine (NAMENDA) 10 MG tablet Take 1 tablet (10 mg total) by mouth 2 (two) times daily.   memantine (NAMENDA) 10 MG tablet Take 1 tablet (10 mg total) by mouth 2 (two) times daily.   No facility-administered encounter medications on file as of 06/13/2021.     Objective:     PHYSICAL EXAMINATION:    VITALS:  Vitals:   06/13/21 1311  BP: 120/74  Pulse: (!) 111  Resp: 20  SpO2: 95%  Weight: 144 lb (65.3 kg)  Height: 5' 3"  (1.6 m)    GEN:  The patient appears stated age and is in NAD. HEENT:  Normocephalic, atraumatic.   Neurological examination:  General: NAD, well-groomed, appears stated age. Orientation: The patient is alert. Oriented to person, place and date Cranial nerves: There is good facial symmetry.The speech is fluent and clear. No aphasia or dysarthria. Fund of knowledge is normal ppropriate. Recent and remote memory are impaired. Attention and concentration are reduced.  Able to name objects and repeat phrases.  Hearing is intact to conversational tone.    Sensation: Sensation is intact to light touch throughout Motor: Strength is at least antigravity x4. Tremors: none  DTR's 1/4 in UE/LE    No flowsheet data found. MMSE - Mini Mental State Exam 04/04/2019  Orientation to time 2  Orientation to Place 3  Registration 2  Attention/ Calculation 0  Recall 1  Language- name 2 objects 2  Language- repeat 1  Language- follow 3 step command 3  Language- read &  follow direction 0  Write a sentence 0  Copy design 1  Total score 15    No flowsheet data found.     Movement examination: Tone: There is normal tone in the UE/LE Abnormal movements:  no tremor.  No myoclonus.  No asterixis.   Coordination:  There is no decremation with RAM's. Normal finger to nose  Gait and Station: The patient has no difficulty arising out of a deep-seated chair without the use of the hands. The patient's stride length is good.  Gait is cautious and narrow.     Total time spent on today's visit was 35 minutes, including both face-to-face time and nonface-to-face time. Time included that spent on review of records (prior notes available to me/labs/imaging if pertinent), discussing treatment and goals, answering patient's questions and coordinating care.  Cc:  Charlott Rakes, MD Sharene Butters, PA-C

## 2021-06-13 NOTE — Patient Instructions (Addendum)
°   Continue Memantine to 10 meg 1 tablet in AM, 1 tablet in PM Follow-up in 6 months, call for any changes  FALL PRECAUTIONS: Be cautious when walking. Scan the area for obstacles that may increase the risk of trips and falls. When getting up in the mornings, sit up at the edge of the bed for a few minutes before getting out of bed. Consider elevating the bed at the head end to avoid drop of blood pressure when getting up. Walk always in a well-lit room (use night lights in the walls). Avoid area rugs or power cords from appliances in the middle of the walkways. Use a walker or a cane if necessary and consider physical therapy for balance exercise. Get your eyesight checked regularly.  HOME SAFETY: Consider the safety of the kitchen when operating appliances like stoves, microwave oven, and blender. Consider having supervision and share cooking responsibilities until no longer able to participate in those. Accidents with firearms and other hazards in the house should be identified and addressed as well.  DRIVING: Regarding driving, in patients with progressive memory problems, driving will be impaired. We advise to have someone else do the driving if trouble finding directions or if minor accidents are reported. Independent driving assessment is available to determine safety of driving.  ABILITY TO BE LEFT ALONE: If patient is unable to contact 911 operator, consider using LifeLine, or when the need is there, arrange for someone to stay with patients. Smoking is a fire hazard, consider supervision or cessation. Risk of wandering should be assessed by caregiver and if detected at any point, supervision and safe proof recommendations should be instituted.  MEDICATION SUPERVISION: Inability to self-administer medication needs to be constantly addressed. Implement a mechanism to ensure safe administration of the medications.  RECOMMENDATIONS FOR ALL PATIENTS WITH MEMORY PROBLEMS: 1. Continue to exercise  (Recommend 30 minutes of walking everyday, or 3 hours every week) 2. Increase social interactions - continue going to [place] and enjoy social gatherings with friends and family 3. Eat healthy, avoid fried foods and eat more fruits and vegetables 4. Maintain adequate blood pressure, blood sugar, and blood cholesterol level. Reducing the risk of stroke and cardiovascular disease also helps promoting better memory. 5. Avoid stressful situations. Live a simple life and avoid aggravations. Organize your time and prepare for the next day in anticipation. 6. Sleep well, avoid any interruptions of sleep and avoid any distractions in the bedroom that may interfere with adequate sleep quality 7. Avoid sugar, avoid sweets as there is a strong link between excessive sugar intake, diabetes, and cognitive impairment We discussed the Mediterranean diet, which has been shown to help patients reduce the risk of progressive memory disorders and reduces cardiovascular risk. This includes eating fish, eat fruits and green leafy vegetables, nuts like almonds and hazelnuts, walnuts, and also use olive oil. Avoid fast foods and fried foods as much as possible. Avoid sweets and sugar as sugar use has been linked to worsening of memory function.  There is always a concern of gradual progression of memory problems. If this is the case, then we may need to adjust level of care according to patient needs. Support, both to the patient and caregiver, should then be put into place.

## 2021-07-01 ENCOUNTER — Other Ambulatory Visit: Payer: Self-pay

## 2021-07-08 ENCOUNTER — Other Ambulatory Visit: Payer: Self-pay

## 2021-07-16 ENCOUNTER — Other Ambulatory Visit: Payer: Self-pay

## 2021-07-19 ENCOUNTER — Other Ambulatory Visit: Payer: Self-pay

## 2021-09-04 ENCOUNTER — Ambulatory Visit: Payer: Self-pay | Admitting: Physician Assistant

## 2021-09-09 ENCOUNTER — Ambulatory Visit: Payer: Self-pay | Admitting: Neurology

## 2021-11-02 ENCOUNTER — Emergency Department (HOSPITAL_COMMUNITY)
Admission: EM | Admit: 2021-11-02 | Discharge: 2021-11-02 | Disposition: A | Discharge status: Home / Self Care | Payer: Self-pay | Attending: Emergency Medicine | Admitting: Emergency Medicine

## 2021-11-02 ENCOUNTER — Other Ambulatory Visit: Payer: Self-pay

## 2021-11-02 ENCOUNTER — Emergency Department (HOSPITAL_COMMUNITY): Payer: Self-pay

## 2021-11-02 DIAGNOSIS — S6991XA Unspecified injury of right wrist, hand and finger(s), initial encounter: Secondary | ICD-10-CM | POA: Insufficient documentation

## 2021-11-02 DIAGNOSIS — E119 Type 2 diabetes mellitus without complications: Secondary | ICD-10-CM | POA: Insufficient documentation

## 2021-11-02 DIAGNOSIS — M25531 Pain in right wrist: Secondary | ICD-10-CM | POA: Insufficient documentation

## 2021-11-02 DIAGNOSIS — Z7984 Long term (current) use of oral hypoglycemic drugs: Secondary | ICD-10-CM | POA: Insufficient documentation

## 2021-11-02 DIAGNOSIS — G309 Alzheimer's disease, unspecified: Secondary | ICD-10-CM | POA: Insufficient documentation

## 2021-11-02 DIAGNOSIS — W010XXA Fall on same level from slipping, tripping and stumbling without subsequent striking against object, initial encounter: Secondary | ICD-10-CM | POA: Insufficient documentation

## 2021-11-02 DIAGNOSIS — Z79899 Other long term (current) drug therapy: Secondary | ICD-10-CM | POA: Insufficient documentation

## 2021-11-02 MED ORDER — IBUPROFEN 200 MG PO TABS
600.0000 mg | ORAL_TABLET | Freq: Once | ORAL | Status: AC
  Administered 2021-11-02: 600 mg via ORAL
  Filled 2021-11-02: qty 3

## 2021-11-02 MED ORDER — IBUPROFEN 600 MG PO TABS: 600.0000 mg | ORAL_TABLET | Freq: Four times a day (QID) | ORAL | 0 refills | Status: DC | PRN

## 2021-11-02 MED ORDER — DICLOFENAC SODIUM 1 % EX GEL: 2.0000 g | Freq: Four times a day (QID) | CUTANEOUS | 0 refills | Status: DC

## 2021-11-02 NOTE — ED Provider Notes (Signed)
[facility] DEPT Provider Note   CSN: [id number] Arrival date & time: 11/02/21  1337     History  Chief Complaint  Patient presents with   Wrist Pain    Meredith Ryan is a 61 y.o. female.  With past medical history of type 2 diabetes, Alzheimer's who presents to the emergency department with wrist pain.  She is also accompanied by her daughter.  States that 2 days ago patient was changing in her closet.  She states that she tripped on a piece of clothing and fell, catching herself with her right hand.  She states that since then she has been complaining of right wrist pain.  She denies hitting her head or loss of consciousness.  She is not anticoagulated.  No previous injuries to the wrist.   Wrist Pain      Home Medications Prior to Admission medications   Medication Sig Start Date End Date Taking? Authorizing Provider  atorvastatin (LIPITOR) 20 MG tablet Take 1 tablet (20 mg total) by mouth daily. 05/06/21   Charlott Rakes, MD  Blood Glucose Monitoring Suppl (TRUE METRIX GO GLUCOSE METER) w/Device KIT USE AS DIRECTED IN THE MORNING AND AT BEDTIME. 05/06/21 05/06/22  Charlott Rakes, MD  glipiZIDE (GLUCOTROL) 5 MG tablet Take 1 tablet (5 mg total) by mouth 2 (two) times daily before a meal. 05/06/21   Charlott Rakes, MD  glucose blood test strip USE AS INSTRUCTED 05/06/21 05/06/22  Charlott Rakes, MD  memantine (NAMENDA) 10 MG tablet Take 1 tablet (10 mg total) by mouth 2 (two) times daily. 06/13/21 06/13/22  Rondel Jumbo, PA-C  metFORMIN (GLUCOPHAGE) 500 MG tablet TAKE 2 TABLETS (1,000 MG TOTAL) BY MOUTH 2 (TWO) TIMES DAILY WITH A MEAL. 05/06/21 05/06/22  Charlott Rakes, MD  mirtazapine (REMERON) 30 MG tablet TAKE 1 TABLET (30 MG TOTAL) BY MOUTH AT BEDTIME. 05/06/21 05/06/22  Charlott Rakes, MD      Allergies    Patient has no known allergies.    Review of Systems   Review of Systems  Musculoskeletal:  Positive for arthralgias.  All  other systems reviewed and are negative.  Physical Exam Updated Vital Signs BP (!) 145/94 (BP Location: Left Arm)   Pulse 97   Temp 98.4 F (36.9 C) (Oral)   Resp 16   Ht 5' 4" (1.626 m)   Wt 65.8 kg   SpO2 100%   BMI 24.89 kg/m  Physical Exam Vitals and nursing note reviewed.  Constitutional:      General: She is not in acute distress.    Appearance: Normal appearance. She is not ill-appearing or toxic-appearing.  HENT:     Head: Normocephalic and atraumatic.  Eyes:     General: No scleral icterus. Cardiovascular:     Pulses: Normal pulses.  Pulmonary:     Effort: Pulmonary effort is normal. No respiratory distress.  Musculoskeletal:        General: Tenderness and signs of injury present. No swelling or deformity.     Right wrist: Tenderness present. No deformity, effusion or snuff box tenderness. Decreased range of motion. Normal pulse.  Skin:    General: Skin is warm and dry.     Findings: No rash.  Neurological:     General: No focal deficit present.     Mental Status: She is alert and oriented to person, place, and time. Mental status is at baseline.     Sensory: No sensory deficit.  Psychiatric:  Mood and Affect: Mood normal.        Behavior: Behavior normal.        Thought Content: Thought content normal.        Judgment: Judgment normal.    ED Results / Procedures / Treatments   Labs (all labs ordered are listed, but only abnormal results are displayed) Labs Reviewed - No data to display  EKG None  Radiology DG Wrist Complete Right  Result Date: 11/02/2021 CLINICAL DATA:  Pain after a fall 2 days ago. EXAM: RIGHT WRIST - COMPLETE 3+ VIEW COMPARISON:  None Available. FINDINGS: There is no evidence of fracture or dislocation. There is no evidence of arthropathy or other focal bone abnormality. Soft tissues are unremarkable. IMPRESSION: Negative. Electronically Signed   By: Zerita Boers M.D.   On: 11/02/2021 14:48    Procedures Procedures    Medications Ordered in ED Medications  ibuprofen (ADVIL) tablet 600 mg (600 mg Oral Given 11/02/21 1557)    ED Course/ Medical Decision Making/ A&P                           Medical Decision Making Risk OTC drugs. Prescription drug management.  This patient presents to the ED for concern of wrist pain, this involves an extensive number of treatment options, and is a complaint that carries with it a high risk of complications and morbidity.  The differential diagnosis includes fracture, dislocation, ligamentous or tendonous injury    Co morbidities that complicate the patient evaluation Alzheimer's   Additional history obtained:  Additional history obtained from: Daughter at bedside External records from outside source obtained and reviewed including: None  Imaging Studies ordered:  I ordered imaging studies which included x-ray.  I independently reviewed & interpreted imaging & am in agreement with radiology impression. Imaging shows: XR Right wrist negative  Medications  I ordered medication including ibuprofen for pain Reevaluation of the patient after medication shows that patient improved -I reviewed the patient's home medications and did not make adjustments. -I did  prescribe new home medications.  ED Course: 61 year old female who presents to the emergency department with right wrist pain.  Slight decreased ROM with right wrist flexion. Decreased grip strength 2/2 pain. Neurovascularly intact.  No evidence of effusion. No warmth, erythema or swelling concerning for septic joint.  XR negative   Placed in volar wrist brace for comfort. Will prescribe her ibuprofen and voltaren gel. Instructed to RICE. Given information for [facility] if she has ongoing pain symptoms.Daughter verbalizes understanding of discharge instructions.  After consideration of the diagnostic results and the patients response to treatment, I feel that the patent would benefit from  discharge. The patient has been appropriately medically screened and/or stabilized in the ED. I have low suspicion for any other emergent medical condition which would require further screening, evaluation or treatment in the ED or require inpatient management. The patient is overall well appearing and non-toxic in appearance. They are hemodynamically stable at time of discharge.   Final Clinical Impression(s) / ED Diagnoses Final diagnoses:  Right wrist pain    Rx / DC Orders ED Discharge Orders          Ordered    ibuprofen (ADVIL) 600 MG tablet  Every 6 hours PRN        11/02/21 1533    diclofenac Sodium (VOLTAREN) 1 % GEL  4 times daily        11/02/21 1533  Mickie Hillier, PA-C 11/02/21 1603    Drenda Freeze, MD 11/02/21 2242

## 2021-11-02 NOTE — ED Triage Notes (Signed)
Patient fell two days ago, tried to catch self, injured right wrist. Rom intact. Pain rated 5/10. Patient points to web of hand when asked to show where pain is.,

## 2021-11-02 NOTE — ED Provider Triage Note (Signed)
Emergency Medicine Provider Triage Evaluation Note  Meredith Ryan , a 61 y.o. female  was evaluated in triage.  Pt complains of fall that occurred 2 days ago.  Patient's daughter is at bedside.  Patient has history of Alzheimer's.  Daughter states patient had a fall 2 days ago while she was changing close.  She caught herself with her hands.  Has been complaining of right wrist pain since.  Denies hitting her head, loss of consciousness, or use of anticoagulation.  Denies any other injuries..  Review of Systems  Positive: As above Negative: As above  Physical Exam  BP (!) 145/94 (BP Location: Left Arm)   Pulse 97   Temp 98.4 F (36.9 C) (Oral)   Resp 16   SpO2 100%  Gen:   Awake, no distress   Resp:  Normal effort  MSK:   Moves extremities without difficulty  Other:  Full range of motion of the right wrist.  Mild tenderness to palpation present over the radial aspect of the wrist.  Full range of motion of all digits of the right hand.  Right elbow or forearm without tenderness to palpation.  2+ radial pulse present.  Medical Decision Making  Medically screening exam initiated at 2:02 PM.  Appropriate orders placed.  Luis Sami was informed that the remainder of the evaluation will be completed by another provider, this initial triage assessment does not replace that evaluation, and the importance of remaining in the ED until their evaluation is complete.     Marita Kansas, PA-C 11/02/21 [phone]

## 2021-11-02 NOTE — Progress Notes (Signed)
Orthopedic Tech Progress Note Patient Details:  Meredith Ryan 16-Dec-1960 [id number]  Ortho Devices Type of Ortho Device: Velcro wrist forearm splint Ortho Device/Splint Location: right Ortho Device/Splint Interventions: Application   Post Interventions Patient Tolerated: Well Instructions Provided: Care of device, Adjustment of device  Saul Fordyce 11/02/2021, 3:41 PM

## 2021-11-02 NOTE — Discharge Instructions (Addendum)
You were seen in the emergency department today for wrist pain.  You have likely sprained your wrist.  We have provided you with a splint for you to use as needed for comfort.  Additionally I am prescribing you ibuprofen and a anti-inflammatory gel for you to use.  Please ice multiple times a day.  I have given you the information for a local orthopedic.  If you continue have pain symptoms please call [facility] here in [place] to make a follow-up appointment.

## 2021-11-06 ENCOUNTER — Other Ambulatory Visit: Payer: Self-pay

## 2021-11-07 ENCOUNTER — Other Ambulatory Visit: Payer: Self-pay

## 2021-11-07 ENCOUNTER — Ambulatory Visit: Payer: Self-pay | Admitting: Physician Assistant

## 2021-11-08 ENCOUNTER — Other Ambulatory Visit: Payer: Self-pay

## 2021-11-08 ENCOUNTER — Encounter: Payer: Self-pay | Admitting: Physician Assistant

## 2021-11-08 ENCOUNTER — Ambulatory Visit: Payer: Self-pay | Admitting: Physician Assistant

## 2021-11-08 DIAGNOSIS — Z029 Encounter for administrative examinations, unspecified: Secondary | ICD-10-CM

## 2021-12-12 ENCOUNTER — Ambulatory Visit: Payer: Self-pay | Admitting: Physician Assistant

## 2021-12-16 ENCOUNTER — Other Ambulatory Visit: Payer: Self-pay

## 2022-01-02 ENCOUNTER — Other Ambulatory Visit: Payer: Self-pay

## 2022-01-02 ENCOUNTER — Other Ambulatory Visit: Payer: Self-pay | Admitting: Family Medicine

## 2022-01-02 DIAGNOSIS — E1165 Type 2 diabetes mellitus with hyperglycemia: Secondary | ICD-10-CM

## 2022-01-02 MED ORDER — METFORMIN HCL 500 MG PO TABS
ORAL_TABLET | Freq: Two times a day (BID) | ORAL | 0 refills | Status: DC
  Filled 2022-01-02 – 2022-01-09 (×2): qty 120, 30d supply, fill #0

## 2022-01-04 ENCOUNTER — Ambulatory Visit: Payer: Self-pay | Admitting: Family

## 2022-01-04 ENCOUNTER — Encounter: Payer: Self-pay | Admitting: Family

## 2022-01-04 NOTE — Patient Instructions (Signed)
1) Labs today 2) Take your prescribed medications as prescribed 3) Report new or worsening symptoms to the clinic or local ER 4) Follow up in 6 months

## 2022-01-08 ENCOUNTER — Other Ambulatory Visit: Payer: Self-pay

## 2022-01-09 ENCOUNTER — Other Ambulatory Visit: Payer: Self-pay

## 2022-02-04 ENCOUNTER — Other Ambulatory Visit: Payer: Self-pay | Admitting: Family Medicine

## 2022-02-04 ENCOUNTER — Other Ambulatory Visit: Payer: Self-pay

## 2022-02-04 DIAGNOSIS — E1165 Type 2 diabetes mellitus with hyperglycemia: Secondary | ICD-10-CM

## 2022-02-11 ENCOUNTER — Other Ambulatory Visit: Payer: Self-pay

## 2022-03-07 ENCOUNTER — Other Ambulatory Visit: Payer: Self-pay | Admitting: Family Medicine

## 2022-03-07 ENCOUNTER — Other Ambulatory Visit: Payer: Self-pay

## 2022-03-07 DIAGNOSIS — E1165 Type 2 diabetes mellitus with hyperglycemia: Secondary | ICD-10-CM

## 2022-03-07 MED ORDER — GLIPIZIDE 5 MG PO TABS
5.0000 mg | ORAL_TABLET | Freq: Two times a day (BID) | ORAL | 0 refills | Status: DC
  Filled 2022-03-07: qty 60, 30d supply, fill #0

## 2022-03-07 MED ORDER — METFORMIN HCL 500 MG PO TABS
ORAL_TABLET | Freq: Two times a day (BID) | ORAL | 0 refills | Status: DC
  Filled 2022-03-07: qty 120, 30d supply, fill #0

## 2022-03-07 MED ORDER — ATORVASTATIN CALCIUM 20 MG PO TABS
20.0000 mg | ORAL_TABLET | Freq: Every day | ORAL | 0 refills | Status: DC
  Filled 2022-03-07: qty 30, 30d supply, fill #0

## 2022-03-07 NOTE — Telephone Encounter (Signed)
Requested medication (s) are due for refill today: yes  Requested medication (s) are on the active medication list: yes  Last refill:  Glipizide 05/06/2021 #180 with 1 RF, Lipitor 05/06/2021 #30 with 6 RF, metformin 01/02/2022 #120 with 0 RF  Future visit scheduled: 03/10/22 walk in, No Show 01/04/22  Notes to clinic:  Failed protocol of labs within 12 months, CBC and lipids from 05/22/2020, has upcoming appt, please assess.       Requested Prescriptions  Pending Prescriptions Disp Refills   glipiZIDE (GLUCOTROL) 5 MG tablet 180 tablet 1    Sig: Take 1 tablet (5 mg total) by mouth 2 (two) times daily before a meal.     Endocrinology:  Diabetes - Sulfonylureas Failed - 03/07/2022  3:49 PM      Failed - HBA1C is between 0 and 7.9 and within 180 days    HbA1c, POC (controlled diabetic range)  Date Value Ref Range Status  05/06/2021 11.1 (A) 0.0 - 7.0 % Final         Failed - Valid encounter within last 6 months    Recent Outpatient Visits           2 months ago    [place], Broadus John, FNP   9 months ago Type 2 diabetes mellitus with hyperglycemia, without long-term current use of insulin [facility])   [place], [place], [place]   10 months ago Type 2 diabetes mellitus with hyperglycemia, without long-term current use of insulin ([place])   [place], [place], MD   1 year ago Type 2 diabetes mellitus with hyperglycemia, without long-term current use of insulin [facility])   [facility], Cari S, PA-C   1 year ago Type 2 diabetes mellitus with hyperglycemia, without long-term current use of insulin ([place])   [place], Cari S, [place]       Future Appointments             In 3 days [place] - Cr in normal range and within 360 days    Creat  Date Value Ref Range  Status  07/21/2016 0.77 0.50 - 1.05 mg/dL Final    Comment:      For patients > or = 63 years of age: The upper reference limit for Creatinine is approximately 13% higher for people identified as African-American.      Creatinine, Ser  Date Value Ref Range Status  06/06/2021 0.75 0.57 - 1.00 mg/dL Final   Creatinine, POC  Date Value Ref Range Status  07/15/2012 100 mg/dL Final          atorvastatin (LIPITOR) 20 MG tablet 30 tablet 6    Sig: Take 1 tablet (20 mg total) by mouth daily.     Cardiovascular:  Antilipid - Statins Failed - 03/07/2022  3:49 PM      Failed - Lipid Panel in normal range within the last 12 months    Cholesterol, Total  Date Value Ref Range Status  05/22/2020 288 (H) 100 - 199 mg/dL Final   LDL Chol Calc (NIH)  Date Value Ref Range Status  05/22/2020 202 (H) 0 - 99 mg/dL Final   HDL  Date Value Ref Range Status  05/22/2020 67 >39 mg/dL Final   Triglycerides  Date Value Ref  Range Status  05/22/2020 108 0 - 149 mg/dL Final         Passed - Patient is not pregnant      Passed - Valid encounter within last 12 months    Recent Outpatient Visits           2 months ago    [place], Broadus John, FNP   9 months ago Type 2 diabetes mellitus with hyperglycemia, without long-term current use of insulin [facility])   [place], Levada Dy M, [place]   10 months ago Type 2 diabetes mellitus with hyperglycemia, without long-term current use of insulin ([place])   [place], Charlane Ferretti, MD   1 year ago Type 2 diabetes mellitus with hyperglycemia, without long-term current use of insulin [facility])   [facility] at [facility], Cari S, PA-C   1 year ago Type 2 diabetes mellitus with hyperglycemia, without long-term current use of insulin ([place])   [place], Cari S, PA-C       Future Appointments             In  3 days [place]              metFORMIN (GLUCOPHAGE) 500 MG tablet 120 tablet 0    Sig: TAKE 2 TABLETS (1,000 MG TOTAL) BY MOUTH 2 (TWO) TIMES DAILY WITH A MEAL.     Endocrinology:  Diabetes - Biguan[place] Failed - 03/07/2022  3:49 PM      Failed - HBA1C is between 0 and 7.9 and within 180 days    HbA1c, POC (controlled diabetic range)  Date Value Ref Range Status  05/06/2021 11.1 (A) 0.0 - 7.0 % Final         Failed - B12 Level in normal range and within 720 days    Vitamin B-12  Date Value Ref Range Status  07/15/2012 648 211 - 911 pg/mL Final         Failed - Valid encounter within last 6 months    Recent Outpatient Visits           2 months ago    [place], Broadus John, FNP   9 months ago Type 2 diabetes mellitus with hyperglycemia, without long-term current use of insulin [facility])   [place], [place], [place]   10 months ago Type 2 diabetes mellitus with hyperglycemia, without long-term current use of insulin ([place])   [place], [place], MD   1 year ago Type 2 diabetes mellitus with hyperglycemia, without long-term current use of insulin [facility])   Primary Care at [facility], Cari S, PA-C   1 year ago Type 2 diabetes mellitus with hyperglycemia, without long-term current use of insulin ([place])   [place], [place]       Future Appointments             In 3 days [place]             Failed - CBC within normal limits and completed in the last 12 months    WBC  Date Value Ref Range Status  05/22/2020 7.5 3.4 - 10.8 x10E3/uL Final  07/21/2016 7.4 3.8 - 10.8 K/uL Final   RBC  Date Value  Ref Range Status  05/22/2020 5.54 (H) 3.77 - 5.28 x10E6/uL Final  07/21/2016 5.27 (H) 3.80 - 5.10 MIL/uL Final   Hemoglobin  Date Value Ref Range Status   05/22/2020 13.6 11.1 - 15.9 g/dL Final   Hematocrit  Date Value Ref Range Status  05/22/2020 43.3 34.0 - 46.6 % Final   MCHC  Date Value Ref Range Status  05/22/2020 31.4 (L) 31.5 - 35.7 g/dL Final  07/21/2016 31.9 (L) 32.0 - 36.0 g/dL Final   [facility]  Date Value Ref Range Status  05/22/2020 24.5 (L) 26.6 - 33.0 pg Final  07/21/2016 24.5 (L) 27.0 - 33.0 pg Final   MCV  Date Value Ref Range Status  05/22/2020 78 (L) 79 - 97 fL Final   No results found for: "PLTCOUNTKUC", "LABPLAT", "POCPLA" RDW  Date Value Ref Range Status  05/22/2020 15.5 (H) 11.7 - 15.4 % Final         Passed - Cr in normal range and within 360 days    Creat  Date Value Ref Range Status  07/21/2016 0.77 0.50 - 1.05 mg/dL Final    Comment:      For patients > or = 63 years of age: The upper reference limit for Creatinine is approximately 13% higher for people identified as African-American.      Creatinine, Ser  Date Value Ref Range Status  06/06/2021 0.75 0.57 - 1.00 mg/dL Final   Creatinine, POC  Date Value Ref Range Status  07/15/2012 100 mg/dL Final         Passed - eGFR in normal range and within 360 days    GFR, Est African American  Date Value Ref Range Status  08/22/2014 >89 mL/min Final   GFR calc Af Amer  Date Value Ref Range Status  05/22/2020 89 >59 mL/min/1.73 Final    Comment:    **In accordance with recommendations from the NKF-ASN Task force,**   [facility] is in the process of updating its eGFR calculation to the   2021 CKD-EPI creatinine equation that estimates kidney function   without a race variable.    GFR, Est Non African American  Date Value Ref Range Status  08/22/2014 >89 mL/min Final    Comment:      The estimated GFR is a calculation valid for adults (>=9 years old) that uses the CKD-EPI algorithm to adjust for age and sex. It is   not to be used for children, pregnant women, hospitalized patients,    patients on dialysis, or with rapidly changing kidney  function. According to the NKDEP, eGFR >89 is normal, 60-89 shows mild impairment, 30-59 shows moderate impairment, 15-29 shows severe impairment and <15 is ESRD.      GFR calc non Af Amer  Date Value Ref Range Status  05/22/2020 77 >59 mL/min/1.73 Final   eGFR  Date Value Ref Range Status  06/06/2021 90 >59 mL/min/1.73 Final

## 2022-03-10 ENCOUNTER — Ambulatory Visit: Payer: Self-pay | Attending: Family Medicine | Admitting: Family Medicine

## 2022-03-10 ENCOUNTER — Other Ambulatory Visit: Payer: Self-pay

## 2022-03-10 ENCOUNTER — Encounter: Payer: Self-pay | Admitting: Family Medicine

## 2022-03-10 ENCOUNTER — Other Ambulatory Visit: Payer: Self-pay | Admitting: Pharmacist

## 2022-03-10 VITALS — BP 133/80 | HR 90 | Temp 98.2°F | Ht 64.0 in | Wt 153.8 lb

## 2022-03-10 DIAGNOSIS — Z1231 Encounter for screening mammogram for malignant neoplasm of breast: Secondary | ICD-10-CM

## 2022-03-10 DIAGNOSIS — G300 Alzheimer's disease with early onset: Secondary | ICD-10-CM

## 2022-03-10 DIAGNOSIS — E785 Hyperlipidemia, unspecified: Secondary | ICD-10-CM

## 2022-03-10 DIAGNOSIS — F02B0 Dementia in other diseases classified elsewhere, moderate, without behavioral disturbance, psychotic disturbance, mood disturbance, and anxiety: Secondary | ICD-10-CM

## 2022-03-10 DIAGNOSIS — E1165 Type 2 diabetes mellitus with hyperglycemia: Secondary | ICD-10-CM

## 2022-03-10 LAB — GLUCOSE, POCT (MANUAL RESULT ENTRY)
POC Glucose: 410 mg/dl — AB (ref 70–99)
POC Glucose: 409 mg/dl — AB (ref 70–99)

## 2022-03-10 LAB — POCT URINALYSIS DIP (CLINITEK)
Bilirubin, UA: NEGATIVE
Blood, UA: NEGATIVE
Glucose, UA: >=1000 mg/dL — AB
Leukocytes, UA: NEGATIVE
Nitrite, UA: NEGATIVE
POC PROTEIN,UA: NEGATIVE
Spec Grav, UA: 1.015 (ref 1.010–1.025)
Urobilinogen, UA: 0.2 E.U./dL
pH, UA: 6 (ref 5.0–8.0)

## 2022-03-10 LAB — POCT GLYCOSYLATED HEMOGLOBIN (HGB A1C): HbA1c, POC (controlled diabetic range): 12 % — AB (ref 0.0–7.0)

## 2022-03-10 MED ORDER — LANTUS SOLOSTAR 100 UNIT/ML ~~LOC~~ SOPN
10.0000 [IU] | PEN_INJECTOR | Freq: Every day | SUBCUTANEOUS | 6 refills | Status: DC
  Filled 2022-03-10: qty 3, 30d supply, fill #0
  Filled 2022-04-07: qty 3, 30d supply, fill #1

## 2022-03-10 MED ORDER — INSULIN PEN NEEDLE 31G X 5 MM MISC
1.0000 | Freq: Every day | 5 refills | Status: DC
  Filled 2022-03-10: qty 100, 100d supply, fill #0

## 2022-03-10 MED ORDER — TRUEPLUS LANCETS 28G MISC
1.0000 | Freq: Three times a day (TID) | 12 refills | Status: DC
  Filled 2022-03-10: qty 100, 33d supply, fill #0

## 2022-03-10 MED ORDER — TRUE METRIX METER W/DEVICE KIT
PACK | 0 refills | Status: DC
  Filled 2022-03-10: qty 1, 28d supply, fill #0

## 2022-03-10 MED ORDER — INSULIN ASPART 100 UNIT/ML IJ SOLN
8.0000 [IU] | Freq: Once | INTRAMUSCULAR | Status: AC
  Administered 2022-03-10: 8 [IU] via SUBCUTANEOUS

## 2022-03-10 MED ORDER — METFORMIN HCL 500 MG PO TABS
ORAL_TABLET | Freq: Two times a day (BID) | ORAL | 6 refills | Status: DC
  Filled 2022-04-07: qty 120, 30d supply, fill #0
  Filled 2022-07-23: qty 360, 90d supply, fill #1

## 2022-03-10 MED ORDER — ATORVASTATIN CALCIUM 20 MG PO TABS
20.0000 mg | ORAL_TABLET | Freq: Every day | ORAL | 6 refills | Status: DC
  Filled 2022-04-07: qty 30, 30d supply, fill #0
  Filled 2022-05-12: qty 30, 30d supply, fill #1
  Filled 2022-06-12: qty 30, 30d supply, fill #2
  Filled 2022-07-07: qty 30, 30d supply, fill #3
  Filled 2022-07-23: qty 90, 90d supply, fill #4

## 2022-03-10 MED ORDER — GLUCOSE BLOOD VI STRP
ORAL_STRIP | 12 refills | Status: AC
  Filled 2022-03-10: qty 100, 25d supply, fill #0

## 2022-03-10 MED ORDER — GLIPIZIDE 5 MG PO TABS
5.0000 mg | ORAL_TABLET | Freq: Two times a day (BID) | ORAL | 6 refills | Status: DC
  Filled 2022-04-07: qty 60, 30d supply, fill #0
  Filled 2022-07-23: qty 180, 90d supply, fill #1

## 2022-03-10 NOTE — Patient Instructions (Signed)
Insulin Glargine Injection What is this medication? INSULIN GLARGINE (IN su lin GLAR geen) treats diabetes. It works by increasing insulin levels in your body, which decreases your blood sugar (glucose). It belongs to a group of medications called long-acting insulins or basal insulins. Changes to diet and exercise are often combined with this medication. This medicine may be used for other purposes; ask your health care provider or pharmacist if you have questions. COMMON BRAND NAME(S): BASAGLAR, Basaglar Tempo, Lantus, Lantus SoloStar, REZVOGLAR, Semglee, Toujeo Max SoloStar, [employer] [facility] What should I tell my care team before I take this medication? They need to know if you have any of these conditions: Episodes of low blood sugar Eye disease, vision problems Kidney disease Liver disease An unusual or allergic reaction to insulin, metacresol, other medications, foods, dyes, or preservatives Pregnant or trying to get pregnant Breast-feeding How should I use this medication? This medication is for injection under the skin. Use this medication at the same time each day. Use exactly as directed. This insulin should never be mixed in the same syringe with other insulins before injection. Do not vigorously shake before use. You will be taught how to use this medication and how to adjust doses for activities and illness. Do not use more insulin than prescribed. Always check the appearance of your insulin before using it. This medication should be clear and colorless like water. Do not use it if it is cloudy, thickened, colored, or has solid particles in it. If you use an insulin pen, be sure to take off the outer needle cover before using the dose. It is important that you put your used needles and syringes in a special sharps container. Do not put them in a trash can. If you do not have a sharps container, call your pharmacist or care team to get one. This medication comes with INSTRUCTIONS FOR  USE. Ask your pharmacist for directions on how to use this medication. Read the information carefully. Talk to your pharmacist or care team if you have questions. Talk to your care team regarding the use of this medication in children. While this medication may be prescribed for children as young as 6 years for selected conditions, precautions do apply. Overdosage: If you think you have taken too much of this medicine contact a poison control center or emergency room at once. NOTE: This medicine is only for you. Do not share this medicine with others. What if I miss a dose? It is important not to miss a dose. Your care team should discuss a plan for missed doses with you. If you do miss a dose, follow their plan. Do not take double doses. What may interact with this medication? Alcohol containing beverages Antiviral medications for HIV or AIDS Aspirin and aspirin-like medications Beta-blockers like atenolol, metoprolol, propranolol Certain medications for blood pressure, heart disease, irregular heart beat Chromium Clonidine Diuretics Female hormones, such as estrogens or progestins, birth control pills Fenofibrate Gemfibrozil Guanethidine Isoniazid Lanreotide Female hormones or anabolic steroids MAOIs like Carbex, Eldepryl, Marplan, Nardil, and Parnate Medications for weight loss Medications for allergies, asthma, cold, or cough Medications for depression, anxiety, or psychotic disturbances Niacin Nicotine NSAIDs, medications for pain and inflammation, like ibuprofen or naproxen Octreotide Other medications for diabetes, like glyburide, glipizide, or glimepiride Pasireotide Pentamidine Phenytoin Probenecid Quinolone antibiotics such as ciprofloxacin, levofloxacin, ofloxacin Reserpine Some herbal dietary supplements Steroid medications such as prednisone or cortisone Sulfamethoxazole; trimethoprim Thyroid hormones This list may not describe all possible interactions. Give your  health care provider a list of all the medicines, herbs, non-prescription drugs, or dietary supplements you use. Also tell them if you smoke, drink alcohol, or use illegal drugs. Some items may interact with your medicine. What should I watch for while using this medication? Visit your care team for regular checks on your progress. Do not drive, use machinery, or do anything that needs mental alertness until you know how this medication affects you. Alcohol may interfere with the effect of this medication. Avoid alcoholic drinks. A test called the HbA1C (A1C) will be monitored. This is a simple blood test. It measures your blood sugar control over the last 2 to 3 months. You will receive this test every 3 to 6 months. Learn how to check your blood sugar. Learn the symptoms of low and high blood sugar and how to manage them. Always carry a quick-source of sugar with you in case you have symptoms of low blood sugar. Examples include hard sugar candy or glucose tablets. Make sure others know that you can choke if you eat or drink when you develop serious symptoms of low blood sugar, such as seizures or unconsciousness. They must get medical help at once. Tell your care team if you have high blood sugar. You might need to change the dose of your medication. If you are sick or exercising more than usual, you might need to change the dose of your medication. Do not skip meals. Ask your care team if you should avoid alcohol. Many nonprescription cough and cold products contain sugar or alcohol. These can affect blood sugar. Make sure that you have the right kind of syringe for the type of insulin you use. Try not to change the brand and type of insulin or syringe unless your care team tells you to. Switching insulin brand or type can cause dangerously high or low blood sugar. Always keep an extra supply of insulin, syringes, and needles on hand. Use a syringe one time only. Throw away syringe and needle in a  closed container to prevent accidental needle sticks. Insulin pens and cartridges should never be shared. Even if the needle is changed, sharing may result in passing of viruses like hepatitis or HIV. Each time you get a new box of pen needles, check to see if they are the same type as the ones you were trained to use. If not, ask your care team to show you how to use this new type properly. Wear a medical ID bracelet or chain, and carry a card that describes your disease and details of your medication and dosage times. What side effects may I notice from receiving this medication? Side effects that you should report to your care team as soon as possible: Allergic reactions--skin rash, itching, hives, swelling of the face, lips, tongue, or throat Low blood sugar (hypoglycemia)--tremors or shaking, anxiety, sweating, cold or clammy skin, confusion, dizziness, rapid heartbeat Low potassium level--muscle pain or cramps, unusual weakness or fatigue, fast or irregular heartbeat, constipation Side effects that usually do not require medical attention (report to your care team if they continue or are bothersome): Lipodystrophy--hardening or scarring of tissue at injection site Pain, redness, or irritation at injection site Weight gain This list may not describe all possible side effects. Call your doctor for medical advice about side effects. You may report side effects to FDA at 1-800-FDA-1088. Where should I keep my medication? Keep out of the reach of children and pets. Unopened Vials: Lantus vials: Store in a refrigerator  between 2 and 8 degrees C (36 and 46 degrees F) or at room temperature below 30 degrees C (86 degrees F). Do not freeze or use if the insulin has been frozen. Protect from light and excessive heat. If stored at room temperature, the vial must be discarded after 28 days. Throw away any unopened and unused medication that has been stored in the refrigerator after the expiration  date. Unopened Pens: Neurosurgeon: Store in a refrigerator between 2 and 8 degrees C (36 and 46 degrees F) or at room temperature below 30 degrees C (86 degrees F). Do not freeze or use if the insulin has been frozen. Protect from light and excessive heat. If stored at room temperature, the pen must be discarded after 28 days. Throw away any unopened and unused medication that has been stored in the refrigerator after the expiration date. Lantus Solostar Pens: Store in a refrigerator between 2 and 8 degrees C (36 and 46 degrees F) or at room temperature below 30 degrees C (86 degrees F). Do not freeze or use if the insulin has been frozen. Protect from light and excessive heat. If stored at room temperature, the pen must be discarded after 28 days. Throw away any unopened and unused medication that has been stored in the refrigerator after the expiration date. Semglee Pens: Store in a refrigerator between 2 and 8 degrees C (36 and 46 degrees F) or at room temperature below 30 degrees C (86 degrees F). Do not freeze or use if the insulin has been frozen. Protect from light and excessive heat. If stored at room temperature, the pen must be discarded after 28 days. Throw away any unopened and unused medication that has been stored in the refrigerator after the expiration date. Toujeo Solostar Pens or Toujeo Max [employer] Pens: Store in a refrigerator between 2 and 8 degrees C (36 and 46 degrees F). Do not freeze or use if the insulin has been frozen. Protect from light and excessive heat. Throw away any unopened and unused medication that has been stored in the refrigerator after the expiration date. Vials that you are using: Lantus vials: Store in a refrigerator or at room temperature below 30 degrees C (86 degrees F). Do not freeze. Keep away from heat and light. Throw the opened vial away after 28 days. Semglee vials: Store in a refrigerator or at room temperature below 30 degrees C (86 degrees F). Do  not freeze. Keep away from heat and light. Throw the opened vial away after 28 days. Pens that you are using: Basaglar KwikPens: Store at room temperature below 30 degrees C (86 degrees F). Do not refrigerate or freeze. Keep away from heat and light. Throw the pen away after 28 days, even if it still has insulin left in it. Lantus Solostar Pens: Store at room temperature below 30 degrees C (86 degrees F). Do not refrigerate or freeze. Keep away from heat and light. Throw the pen away after 28 days, even if it still has insulin left in it. Semglee Pens: Store at room temperature below 30 degrees C (86 degrees F). Do not refrigerate or freeze. Keep away from heat and light. Throw the pen away after 28 days, even if it still has insulin left in it. Toujeo Solostar Pens or Toujeo Max [employer] Pens: Store at room temperature below 30 degrees C (86 degrees F). Do not refrigerate or freeze. Keep away from heat and light. Throw the pen away after 56 days, even if it  still has insulin left in it. NOTE: This sheet is a summary. It may not cover all possible information. If you have questions about this medicine, talk to your doctor, pharmacist, or health care provider.  2023 Elsevier/Gold Standard (2020-08-18 00:00:00)

## 2022-03-10 NOTE — Progress Notes (Signed)
Subjective:  Patient ID: Meredith Ryan, female    DOB: [birth year]  Age: 61 y.o. MRN: [id number]  CC: Diabetes (Pt needs med refill. )   HPI Shamaine Mulkern is a 61 y.o. year old female with a history of type 2 diabetes mellitus (A1c 12.0), hyperlipidemia seen for follow-up visit Complaining by husband who provides most of the history.  Interval History:  She is currently under the care of [facility] for early onset Alzheimer's with last visit in 05/2021.  She is currently on memantine and was recommended to follow-up in 6 months. Her memory problems seem to be worsening.  Husband states she sometimes cooks and sometimes forgets to turn off the stove.  Last visit to the clinic was 9 months ago as she did not have any family member to bring her in for her appointment. A1c is 12.0 from 11.1 previously and she endorses adherence with her medications Denies additional concerns today.  Past Medical History:  Diagnosis Date   Anemia    DM type 2 (diabetes mellitus, type 2) ([place])    HLD (hyperlipidemia)     No past surgical history on file.  Family History  Problem Relation Age of Onset   Diabetes Mother    Diabetes Father     Social History   Socioeconomic History   Marital status: Married    Spouse name: Not on file   Number of children: Not on file   Years of education: Not on file   Highest education level: Not on file  Occupational History   Not on file  Tobacco Use   Smoking status: Never   Smokeless tobacco: Never  Vaping Use   Vaping Use: Never used  Substance and Sexual Activity   Alcohol use: No   Drug use: No   Sexual activity: Yes  Other Topics Concern   Not on file  Social History Narrative   Lives with husband, 3 children. (23, 33, 69) Oldest daughter married.    Was working at [employer], but laid off.   Right hand   Social Determinants of Health   Financial Resource Strain: Not on file  Food Insecurity: Not on file  Transportation  Needs: Not on file  Physical Activity: Not on file  Stress: Not on file  Social Connections: Not on file    No Known Allergies  Outpatient Medications Prior to Visit  Medication Sig Dispense Refill   Blood Glucose Monitoring Suppl (TRUE METRIX GO GLUCOSE METER) w/Device KIT USE AS DIRECTED IN THE MORNING AND AT BEDTIME. 1 kit 0   diclofenac Sodium (VOLTAREN) 1 % GEL Apply 2 g topically 4 (four) times daily. 150 g 0   ibuprofen (ADVIL) 600 MG tablet Take 1 tablet (600 mg total) by mouth every 6 (six) hours as needed. 30 tablet 0   memantine (NAMENDA) 10 MG tablet Take 1 tablet (10 mg total) by mouth 2 (two) times daily. 60 tablet 11   mirtazapine (REMERON) 30 MG tablet TAKE 1 TABLET (30 MG TOTAL) BY MOUTH AT BEDTIME. 30 tablet 6   atorvastatin (LIPITOR) 20 MG tablet Take 1 tablet (20 mg total) by mouth daily. 30 tablet 0   glipiZIDE (GLUCOTROL) 5 MG tablet Take 1 tablet (5 mg total) by mouth 2 (two) times daily before a meal. 60 tablet 0   glucose blood test strip USE AS INSTRUCTED 100 strip 12   metFORMIN (GLUCOPHAGE) 500 MG tablet TAKE 2 TABLETS (1,000 MG TOTAL) BY MOUTH 2 (TWO) TIMES DAILY  WITH A MEAL. 120 tablet 0   No facility-administered medications prior to visit.     ROS Review of Systems  Constitutional:  Negative for activity change and appetite change.  HENT:  Negative for sinus pressure and sore throat.   Respiratory:  Negative for chest tightness, shortness of breath and wheezing.   Cardiovascular:  Negative for chest pain and palpitations.  Gastrointestinal:  Negative for abdominal distention, abdominal pain and constipation.  Genitourinary: Negative.   Musculoskeletal: Negative.   Psychiatric/Behavioral:  Negative for behavioral problems and dysphoric mood.     Objective:  BP 133/80 (BP Location: Left Arm, Patient Position: Sitting, Cuff Size: Normal)   Pulse 90   Temp 98.2 F (36.8 C) (Oral)   Ht 5' 4"  (1.626 m)   Wt 153 lb 12.8 oz (69.8 kg)   SpO2 98%    BMI 26.40 kg/m      03/10/2022    9:11 AM 11/02/2021    2:07 PM 11/02/2021    1:44 PM  BP/Weight  Systolic BP 923  300  Diastolic BP 80  94  Wt. (Lbs) 153.8 145   BMI 26.4 kg/m2 24.89 kg/m2       Physical Exam Constitutional:      Appearance: She is well-developed.  Cardiovascular:     Rate and Rhythm: Normal rate.     Heart sounds: Normal heart sounds. No murmur heard. Pulmonary:     Effort: Pulmonary effort is normal.     Breath sounds: Normal breath sounds. No wheezing or rales.  Chest:     Chest wall: No tenderness.  Abdominal:     General: Bowel sounds are normal. There is no distension.     Palpations: Abdomen is soft. There is no mass.     Tenderness: There is no abdominal tenderness.  Musculoskeletal:        General: Normal range of motion.     Right lower leg: No edema.     Left lower leg: No edema.  Neurological:     Mental Status: She is alert.     Comments: Unable to recall her age, does not know the year, does not know the name of the president of the [place].  Psychiatric:        Mood and Affect: Mood normal.        Latest Ref Rng & Units 06/06/2021    9:25 AM 05/06/2021    4:17 PM 05/22/2020    9:06 AM  CMP  Glucose 70 - 99 mg/dL 217  398  241   BUN 8 - 27 mg/dL 9  10  10    Creatinine 0.57 - 1.00 mg/dL 0.75  0.77  0.82   Sodium 134 - 144 mmol/L 137  131  136   Potassium 3.5 - 5.2 mmol/L 5.1  5.0  4.5   Chloride 96 - 106 mmol/L 101  94  99   CO2 20 - 29 mmol/L 22  24    Calcium 8.7 - 10.3 mg/dL 9.8  9.4  9.5   Total Protein 6.0 - 8.5 g/dL  7.9  7.5   Total Bilirubin 0.0 - 1.2 mg/dL  <0.2  0.4   Alkaline Phos 44 - 121 IU/L  118  95   AST 0 - 40 IU/L  20  20   ALT 0 - 32 IU/L  15      Lipid Panel     Component Value Date/Time   CHOL 288 (H) 05/22/2020 0906   TRIG 108  05/22/2020 0906   HDL 67 05/22/2020 0906   CHOLHDL 4.3 05/22/2020 0906   CHOLHDL 3.1 10/01/2015 1505   VLDL 34 (H) 10/01/2015 1505   LDLCALC 202 (H) 05/22/2020 0906    CBC     Component Value Date/Time   WBC 7.5 05/22/2020 0906   WBC 7.4 07/21/2016 1606   RBC 5.54 (H) 05/22/2020 0906   RBC 5.27 (H) 07/21/2016 1606   HGB 13.6 05/22/2020 0906   HCT 43.3 05/22/2020 0906   PLT 350 05/22/2020 0906   MCV 78 (L) 05/22/2020 0906   MCH 24.5 (L) 05/22/2020 0906   MCH 24.5 (L) 07/21/2016 1606   MCHC 31.4 (L) 05/22/2020 0906   MCHC 31.9 (L) 07/21/2016 1606   RDW 15.5 (H) 05/22/2020 0906   LYMPHSABS 3.0 05/22/2020 0906   MONOABS 518 07/21/2016 1606   EOSABS 0.3 05/22/2020 0906   BASOSABS 0.1 05/22/2020 0906    Lab Results  Component Value Date   HGBA1C 12.0 (A) 03/10/2022    Assessment & Plan:  1. Type 2 diabetes mellitus with hyperglycemia, without long-term current use of insulin (HCC) Uncontrolled with A1c of 12.0 NovoLog 8 units administered due to CBG of 410 and blood sugar repeated after UA positive for trace ketones We will initiate Lantus Clinical pharmacist called in to provide education on administration - POCT glucose (manual entry) - POCT glycosylated hemoglobin (Hb A1C) - CMP14+EGFR - LP+Non-HDL Cholesterol - glipiZIDE (GLUCOTROL) 5 MG tablet; Take 1 tablet (5 mg total) by mouth 2 (two) times daily before a meal.  Dispense: 60 tablet; Refill: 6 - metFORMIN (GLUCOPHAGE) 500 MG tablet; TAKE 2 TABLETS (1,000 MG TOTAL) BY MOUTH 2 (TWO) TIMES DAILY WITH A MEAL.  Dispense: 120 tablet; Refill: 6 - insulin glargine (LANTUS SOLOSTAR) 100 UNIT/ML Solostar Pen; Inject 10 Units into the skin daily.  Dispense: 30 mL; Refill: 6 - Insulin Pen Needle 31G X 5 MM MISC; Use once daily at bedtime with insulin  Dispense: 30 each; Refill: 5 - TRUEplus Lancets 28G MISC; Use three times daily with meals  Dispense: 100 each; Refill: 12 - glucose blood test strip; USE AS INSTRUCTED  Dispense: 100 strip; Refill: 12 - insulin aspart (novoLOG) injection 8 Units - POCT URINALYSIS DIP (CLINITEK) - POCT glucose (manual entry)  2. Encounter for screening mammogram for  malignant neoplasm of breast - [place] DIGITAL SCREENING TOMO BILATERAL; Future  3. Hyperlipidemia, unspecified hyperlipidemia type Uncontrolled We will send of lipid panel and adjust regimen accordingly Low-cholesterol diet - atorvastatin (LIPITOR) 20 MG tablet; Take 1 tablet (20 mg total) by mouth daily.  Dispense: 30 tablet; Refill: 6   4.  Moderate early onset Alzheimer's dementia Currently on Namenda Follow-up with neurology Patient and spouse counseled on games to stimulate brain function and memory  Meds ordered this encounter  Medications   atorvastatin (LIPITOR) 20 MG tablet    Sig: Take 1 tablet (20 mg total) by mouth daily.    Dispense:  30 tablet    Refill:  6   glipiZIDE (GLUCOTROL) 5 MG tablet    Sig: Take 1 tablet (5 mg total) by mouth 2 (two) times daily before a meal.    Dispense:  60 tablet    Refill:  6   metFORMIN (GLUCOPHAGE) 500 MG tablet    Sig: TAKE 2 TABLETS (1,000 MG TOTAL) BY MOUTH 2 (TWO) TIMES DAILY WITH A MEAL.    Dispense:  120 tablet    Refill:  6   insulin glargine (LANTUS SOLOSTAR)  100 UNIT/ML Solostar Pen    Sig: Inject 10 Units into the skin daily.    Dispense:  30 mL    Refill:  6   Insulin Pen Needle 31G X 5 MM MISC    Sig: Use once daily at bedtime with insulin    Dispense:  30 each    Refill:  5   TRUEplus Lancets 28G MISC    Sig: Use three times daily with meals    Dispense:  100 each    Refill:  12   glucose blood test strip    Sig: USE AS INSTRUCTED    Dispense:  100 strip    Refill:  12   insulin aspart (novoLOG) injection 8 Units    Follow-up: Return in about 1 month (around 04/09/2022) for Diabetes follow-up.       Charlott Rakes, MD, FAAFP. [facility] [place], [place]   03/10/2022, 10:51 AM

## 2022-03-11 LAB — CMP14+EGFR
ALT: 15 IU/L (ref 0–32)
AST: 18 IU/L (ref 0–40)
Albumin/Globulin Ratio: 1.4 (ref 1.2–2.2)
Albumin: 4.4 g/dL (ref 3.9–4.9)
Alkaline Phosphatase: 118 IU/L (ref 44–121)
BUN/Creatinine Ratio: 12 (ref 12–28)
BUN: 12 mg/dL (ref 8–27)
Bilirubin Total: 0.3 mg/dL (ref 0.0–1.2)
CO2: 22 mmol/L (ref 20–29)
Calcium: 9.8 mg/dL (ref 8.7–10.3)
Chloride: 95 mmol/L — ABNORMAL LOW (ref 96–106)
Creatinine, Ser: 0.97 mg/dL (ref 0.57–1.00)
Globulin, Total: 3.2 g/dL (ref 1.5–4.5)
Glucose: 400 mg/dL — ABNORMAL HIGH (ref 70–99)
Potassium: 4.7 mmol/L (ref 3.5–5.2)
Sodium: 132 mmol/L — ABNORMAL LOW (ref 134–144)
Total Protein: 7.6 g/dL (ref 6.0–8.5)
eGFR: 66 mL/min/{1.73_m2} (ref >59)

## 2022-03-11 LAB — LP+NON-HDL CHOLESTEROL
Cholesterol, Total: 174 mg/dL (ref 100–199)
HDL: 61 mg/dL (ref >39)
LDL Chol Calc (NIH): 97 mg/dL (ref 0–99)
Total Non-HDL-Chol (LDL+VLDL): 113 mg/dL (ref 0–129)
Triglycerides: 88 mg/dL (ref 0–149)
VLDL Cholesterol Cal: 16 mg/dL (ref 5–40)

## 2022-03-12 DIAGNOSIS — F0280 Dementia in other diseases classified elsewhere without behavioral disturbance: Secondary | ICD-10-CM | POA: Insufficient documentation

## 2022-03-14 ENCOUNTER — Other Ambulatory Visit: Payer: Self-pay

## 2022-03-17 ENCOUNTER — Telehealth: Payer: Self-pay

## 2022-03-17 NOTE — Telephone Encounter (Signed)
Completed CAP application faxed to [facility] [employer].

## 2022-04-07 ENCOUNTER — Other Ambulatory Visit: Payer: Self-pay

## 2022-04-21 ENCOUNTER — Other Ambulatory Visit: Payer: Self-pay

## 2022-04-21 ENCOUNTER — Ambulatory Visit: Payer: Self-pay | Attending: Family Medicine | Admitting: Family Medicine

## 2022-04-21 ENCOUNTER — Encounter: Payer: Self-pay | Admitting: Family Medicine

## 2022-04-21 VITALS — BP 107/73 | HR 84 | Temp 98.2°F | Ht 64.0 in | Wt 153.0 lb

## 2022-04-21 DIAGNOSIS — F02B0 Dementia in other diseases classified elsewhere, moderate, without behavioral disturbance, psychotic disturbance, mood disturbance, and anxiety: Secondary | ICD-10-CM

## 2022-04-21 DIAGNOSIS — G300 Alzheimer's disease with early onset: Secondary | ICD-10-CM

## 2022-04-21 DIAGNOSIS — E1165 Type 2 diabetes mellitus with hyperglycemia: Secondary | ICD-10-CM

## 2022-04-21 LAB — GLUCOSE, POCT (MANUAL RESULT ENTRY): POC Glucose: 333 mg/dl — AB (ref 70–99)

## 2022-04-21 MED ORDER — LANTUS SOLOSTAR 100 UNIT/ML ~~LOC~~ SOPN
15.0000 [IU] | PEN_INJECTOR | Freq: Every day | SUBCUTANEOUS | 6 refills | Status: DC
  Filled 2022-04-21: qty 3, 20d supply, fill #0
  Filled 2022-06-12: qty 3, 20d supply, fill #1
  Filled 2022-07-23: qty 12, 80d supply, fill #2

## 2022-04-21 NOTE — Progress Notes (Signed)
Subjective:  Patient ID: Meredith Ryan, female    DOB: August 22, 1960  Age: 61 y.o. MRN: [id number]  CC: No chief complaint on file.   HPI Meredith Ryan is a 61 y.o. year old female with a history of type 2 diabetes mellitus (A1c 12.0), hyperlipidemia seen for follow-up visit  She is accompanied by her husband who provides the history.  Interval History: Lantus was initiated at her last visit due to hyperglycemia. Random sugars are 140-150. Lowest sugar was 125 but she is unable to provide her highest random sugar and she denies hypoglycemia. She administers 10-15 units of Lantus qhs depending on her evening sugars.  Spouse is unable to give me any particular formula he goes by to determine how much of insulin he will be administering. CBG is 333 in the clinic and she just had her breakfast just prior to her appointment. She  exercises regularly.  With regards to her memory she remains on Namenda and husband has been working with her to keep her busy and active and she has been participating in brain stimulating games.  With regards to stability or progression of her dementia spouse goes off in tangents when asked about specific memory lapses. Denies additional concerns today.  Past Medical History:  Diagnosis Date   Anemia    DM type 2 (diabetes mellitus, type 2) ([place])    HLD (hyperlipidemia)     No past surgical history on file.  Family History  Problem Relation Age of Onset   Diabetes Mother    Diabetes Father     Social History   Socioeconomic History   Marital status: Married    Spouse name: Not on file   Number of children: Not on file   Years of education: Not on file   Highest education level: Not on file  Occupational History   Not on file  Tobacco Use   Smoking status: Never   Smokeless tobacco: Never  Vaping Use   Vaping Use: Never used  Substance and Sexual Activity   Alcohol use: No   Drug use: No   Sexual activity: Yes  Other Topics Concern   Not on  file  Social History Narrative   Lives with husband, 3 children. (23, 52, 19) Oldest daughter married.    Was working at [employer], but laid off.   Right hand   Social Determinants of Health   Financial Resource Strain: Not on file  Food Insecurity: Not on file  Transportation Needs: Not on file  Physical Activity: Not on file  Stress: Not on file  Social Connections: Not on file    No Known Allergies  Outpatient Medications Prior to Visit  Medication Sig Dispense Refill   atorvastatin (LIPITOR) 20 MG tablet Take 1 tablet (20 mg total) by mouth daily. 30 tablet 6   Blood Glucose Monitoring Suppl (TRUE METRIX METER) w/Device KIT Use as instructed to check blood sugar at home. 1 kit 0   diclofenac Sodium (VOLTAREN) 1 % GEL Apply 2 g topically 4 (four) times daily. 150 g 0   glipiZIDE (GLUCOTROL) 5 MG tablet Take 1 tablet (5 mg total) by mouth 2 (two) times daily before a meal. 60 tablet 6   glucose blood test strip USE AS INSTRUCTED 100 strip 12   ibuprofen (ADVIL) 600 MG tablet Take 1 tablet (600 mg total) by mouth every 6 (six) hours as needed. 30 tablet 0   Insulin Pen Needle 31G X 5 MM MISC Use once daily  at bedtime with insulin 30 each 5   memantine (NAMENDA) 10 MG tablet Take 1 tablet (10 mg total) by mouth 2 (two) times daily. 60 tablet 11   metFORMIN (GLUCOPHAGE) 500 MG tablet TAKE 2 TABLETS (1,000 MG TOTAL) BY MOUTH 2 (TWO) TIMES DAILY WITH A MEAL. 120 tablet 6   mirtazapine (REMERON) 30 MG tablet TAKE 1 TABLET (30 MG TOTAL) BY MOUTH AT BEDTIME. 30 tablet 6   TRUEplus Lancets 28G MISC Use three times daily with meals 100 each 12   insulin glargine (LANTUS SOLOSTAR) 100 UNIT/ML Solostar Pen Inject 10 Units into the skin daily. 30 mL 6   No facility-administered medications prior to visit.     ROS Review of Systems  Constitutional:  Negative for activity change and appetite change.  HENT:  Negative for sinus pressure and sore throat.   Respiratory:  Negative for  chest tightness, shortness of breath and wheezing.   Cardiovascular:  Negative for chest pain and palpitations.  Gastrointestinal:  Negative for abdominal distention, abdominal pain and constipation.  Genitourinary: Negative.   Musculoskeletal: Negative.   Psychiatric/Behavioral:  Negative for behavioral problems and dysphoric mood.     Objective:  BP 107/73   Pulse 84   Temp 98.2 F (36.8 C) (Oral)   Ht 5' 4" (1.626 m)   Wt 153 lb (69.4 kg)   SpO2 100%   BMI 26.26 kg/m      04/21/2022    9:37 AM 03/10/2022    9:11 AM 11/02/2021    2:07 PM  BP/Weight  Systolic BP 254 270   Diastolic BP 73 80   Wt. (Lbs) 153 153.8 145  BMI 26.26 kg/m2 26.4 kg/m2 24.89 kg/m2      Physical Exam Constitutional:      Appearance: She is well-developed.  Cardiovascular:     Rate and Rhythm: Normal rate.     Heart sounds: Normal heart sounds. No murmur heard. Pulmonary:     Effort: Pulmonary effort is normal.     Breath sounds: Normal breath sounds. No wheezing or rales.  Chest:     Chest wall: No tenderness.  Abdominal:     General: Bowel sounds are normal. There is no distension.     Palpations: Abdomen is soft. There is no mass.     Tenderness: There is no abdominal tenderness.  Musculoskeletal:        General: Normal range of motion.     Right lower leg: No edema.     Left lower leg: No edema.  Neurological:     Mental Status: She is alert and oriented to person, place, and time.  Psychiatric:        Mood and Affect: Mood normal.        Latest Ref Rng & Units 03/10/2022   11:15 AM 06/06/2021    9:25 AM 05/06/2021    4:17 PM  CMP  Glucose 70 - 99 mg/dL 400  217  398   BUN 8 - 27 mg/dL _0 Creatinine 0.57 - 1.00 mg/dL 0.97  0.75  0.77   Sodium 134 - 144 mmol/L 132  137  131   Potassium 3.5 - 5.2 mmol/L 4.7  5.1  5.0   Chloride 96 - 106 mmol/L 95  101  94   CO2 20 - 29 mmol/L _1 Calcium 8.7 - 10.3 mg/dL 9.8  9.8  9.4   Total Protein 6.0 - 8.5 g/dL  7.6    7.9   Total Bilirubin 0.0 - 1.2 mg/dL 0.3   <0.2   Alkaline Phos 44 - 121 IU/L 118   118   AST 0 - 40 IU/L 18   20   ALT 0 - 32 IU/L 15   15     Lipid Panel     Component Value Date/Time   CHOL 174 03/10/2022 1115   TRIG 88 03/10/2022 1115   HDL 61 03/10/2022 1115   CHOLHDL 4.3 05/22/2020 0906   CHOLHDL 3.1 10/01/2015 1505   VLDL 34 (H) 10/01/2015 1505   LDLCALC 97 03/10/2022 1115    CBC    Component Value Date/Time   WBC 7.5 05/22/2020 0906   WBC 7.4 07/21/2016 1606   RBC 5.54 (H) 05/22/2020 0906   RBC 5.27 (H) 07/21/2016 1606   HGB 13.6 05/22/2020 0906   HCT 43.3 05/22/2020 0906   PLT 350 05/22/2020 0906   MCV 78 (L) 05/22/2020 0906   MCH 24.5 (L) 05/22/2020 0906   MCH 24.5 (L) 07/21/2016 1606   MCHC 31.4 (L) 05/22/2020 0906   MCHC 31.9 (L) 07/21/2016 1606   RDW 15.5 (H) 05/22/2020 0906   LYMPHSABS 3.0 05/22/2020 0906   MONOABS 518 07/21/2016 1606   EOSABS 0.3 05/22/2020 0906   BASOSABS 0.1 05/22/2020 0906    Lab Results  Component Value Date   HGBA1C 12.0 (A) 03/10/2022    Assessment & Plan:  1. Type 2 diabetes mellitus with hyperglycemia, without long-term current use of insulin (HCC) Uncontrolled with A1c of 12.6 Blood sugar log reveals improvement Increase Lantus from 10 units to 15 units and counseled that if hypoglycemia occurs, she can decrease by 2 units Continue metformin Counseled on Diabetic diet, my plate method, 150 minutes of moderate intensity exercise/week Blood sugar logs with fasting goals of 80-120 mg/dl, random of less than 180 and in the event of sugars less than 60 mg/dl or greater than 400 mg/dl encouraged to notify the clinic. Advised on the need for annual eye exams, annual foot exams, Pneumonia vaccine. - POCT glucose (manual entry) - insulin glargine (LANTUS SOLOSTAR) 100 UNIT/ML Solostar Pen; Inject 15 Units into the skin daily.  Dispense: 30 mL; Refill: 6 - Microalbumin/Creatinine Ratio, Urine  2. 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    Dispense:  30 mL    Refill:  6    Follow-up: Return in about 2 months (around 06/21/2022) for Diabetes follow-up.        , MD, FAAFP. [facility] [place], [place] [phone]   04/21/2022, 11:08 AM 

## 2022-04-21 NOTE — Patient Instructions (Signed)
Alzheimer's Disease Alzheimer's disease is a brain disease that affects memory, thinking, language, and behavior. People with Alzheimer's disease lose mental abilities, and the disease gets worse over time. Alzheimer's disease is a form of dementia. What are the causes? This condition develops when a protein called beta-amyloid forms deposits in the brain. It is not known what causes these deposits to form. Alzheimer's disease may also be caused by a gene mutation that is inherited from one parent or both parents. A gene mutation is a harmful change in a gene. Not everyone who inherits the genetic mutation will get the disease. What increases the risk? You are more likely to develop this condition if you: Are older than age 65. Are female. Have any of these medical conditions: High blood pressure. Diabetes. Heart or blood vessel disease. Smoke. Have obesity. Have had a brain injury. Have had a stroke. Have a family history of dementia. What are the signs or symptoms? Symptoms of this condition may happen in three stages, which often overlap. Early stage In this stage, you may continue to be independent. You may still be able to drive, work, and be social. Symptoms in this stage include: Minor memory problems, such as forgetting a name, words, or what you did recently. Difficulty with: Paying attention. Communicating. Doing familiar tasks. Problem solving or doing calculations. Following instructions. Learning new things. Anxiety. Social withdrawal. Loss of motivation. Moderate stage In this stage, you will start to need care. Symptoms in this stage include: Difficulty with expressing thoughts. Memory loss that affects daily life. This can include forgetting: Recent events that have happened. If you have taken medicines or eaten. Familiar places. You may get lost while walking or driving. To pay bills or manage finances. Personal hygiene such as bathing or using the  bathroom. Confusion about where you are or what time it is. Difficulty in judging distance. Changes in personality, mood, and behavior. You may be moody, irritable, angry, frustrated, fearful, anxious, or suspicious. Poor reasoning and judgment. Delusions or hallucinations. Changes in sleep patterns. Severe stage In the final stage, you will need help with your personal care and daily activities. Symptoms in this stage include: Worsening memory loss. Personality changes. Loss of awareness of your surroundings. Changes in physical abilities, including the ability to walk, sit, and swallow. Difficulty in communicating. Inability to control your bladder and bowels. Increasing confusion. Increasing behavior changes. How is this diagnosed?  This condition is diagnosed by a health care provider who specializes in diseases of the nervous system (neurologist) or one who specializes in care of the elderly (geriatrician or geriatric psychiatrist). Other causes of dementia may also be ruled out. Your health care provider will talk with you and your family, friends, or caregivers about your history and symptoms. A thorough medical history will be taken, and you will have a physical exam and tests. Tests may include: Lab tests, such as blood or urine tests. Imaging tests, such as a CT scan, a PET scan, or an MRI. A lumbar puncture. This test involves removing and testing a small amount of the fluid that surrounds the brain and spinal cord. An electroencephalogram (EEG). In this test, small metal discs are used to measure electrical activity in the brain. Memory tests, cognitive tests, and neuropsychological tests. These tests evaluate brain function. Genetic testing. This may be done if you have early onset of the disease (before age 60) or if other family members have the disease. How is this treated? At this time, there is   no treatment to cure Alzheimer's disease or stop it from getting worse. The  goals of treatment are: To manage behavioral changes. To provide you with a safe environment. To help manage daily life for you and your caregivers. The following treatment options are available: Medicines. Medicines may help the memory work better and manage behavioral symptoms. Cognitive therapy. Cognitive therapy provides you with education, support, and memory aids. It is most helpful in the early stages of the condition. Counseling or spiritual guidance. It is normal to have a lot of feelings, including anger, relief, fear, and isolation. Counseling and guidance can help you deal with these feelings. Caregiving. This involves having caregivers help you with your daily activities. Family support groups. These provide education, emotional support, and information about community resources to family members who are taking care of you. Follow these instructions at home:  Medicines Take over-the-counter and prescription medicines only as told by your health care provider. Use a pill organizer or pill reminder to help you manage your medicines. Avoid taking medicines that can affect thinking, such as pain medicines or sleeping medicines. Lifestyle Make healthy lifestyle choices: Be physically active as told by your health care provider. Regular exercise may help improve symptoms. Do not use any products that contain nicotine or tobacco, such as cigarettes, e-cigarettes, and chewing tobacco. If you need help quitting, ask your health care provider. Do not drink alcohol. Eat a healthy diet. Practice stress-management techniques when you get stressed. Stay social. Drink enough fluid to keep your urine pale yellow. Make sure to get quality sleep. Avoid taking long naps during the day. Take short naps of 30 minutes or less if needed. Keep your sleeping area dark and cool. Avoid exercising during the few hours before you go to bed. Avoid caffeine products in the afternoon and evening. General  instructions Work with your health care provider to determine what you need help with and what your safety needs are. If you were given a bracelet that identifies you as a person with memory loss or tracks your location, make sure to wear it at all times. Talk with your health care provider about whether it is safe for you to drive. Work with your family to make important decisions, such as advance directives, medical power of attorney, or a living will. Keep all follow-up visits. This is important. Where to find more information The Alzheimer's Association: Call the 24-hour helpline at 1-800-272-3900, or visit www.alz.org Contact a health care provider if: You have nausea, vomiting, or trouble with eating related to a medicine. You have worsening mood or behavior changes, such as depression, anxiety, or hallucinations. You or your family members become concerned for your safety. Get help right away if: You become less responsive or are difficult to wake up. Your memory suddenly gets worse. You feel that you want to harm yourself. If you ever feel like you may hurt yourself or others, or have thoughts about taking your own life, get help right away. Go to your nearest emergency department or: Call your local emergency services (911 in the [place] Call a suicide crisis helpline, such as the National Suicide Prevention Lifeline at 1-800-273-8255 or 988 in the [place] This is open 24 hours a day in the [place] Text the Crisis Text Line at 741741 (in the [place] Summary Alzheimer's disease is a brain disease that affects memory, thinking, language, and behavior. Alzheimer's disease is a form of dementia. This condition is diagnosed by a specialist in diseases of the nervous   system (neurologist) or one who specializes in care of the elderly. At this time, there is no treatment to cure Alzheimer's disease or stop it from getting worse. The goal of treatment is to help you manage any symptoms. Work with  your family to make important decisions, such as advance directives, medical power of attorney, or a living will. This information is not intended to replace advice given to you by your health care provider. Make sure you discuss any questions you have with your health care provider. Document Revised: 12/26/2020 Document Reviewed: 09/19/2019 Elsevier Patient Education  2023 Elsevier Inc.  

## 2022-04-25 ENCOUNTER — Ambulatory Visit: Payer: Self-pay

## 2022-05-12 ENCOUNTER — Other Ambulatory Visit: Payer: Self-pay

## 2022-06-12 ENCOUNTER — Other Ambulatory Visit: Payer: Self-pay

## 2022-06-23 ENCOUNTER — Ambulatory Visit: Payer: Medicaid Other | Admitting: Family Medicine

## 2022-07-07 ENCOUNTER — Other Ambulatory Visit: Payer: Self-pay | Admitting: Physician Assistant

## 2022-07-07 ENCOUNTER — Other Ambulatory Visit: Payer: Self-pay | Admitting: Family Medicine

## 2022-07-07 ENCOUNTER — Other Ambulatory Visit: Payer: Self-pay

## 2022-07-08 ENCOUNTER — Other Ambulatory Visit: Payer: Self-pay

## 2022-07-23 ENCOUNTER — Other Ambulatory Visit: Payer: Self-pay

## 2022-07-23 DIAGNOSIS — E1165 Type 2 diabetes mellitus with hyperglycemia: Secondary | ICD-10-CM

## 2022-07-23 DIAGNOSIS — E785 Hyperlipidemia, unspecified: Secondary | ICD-10-CM

## 2022-07-23 MED ORDER — ATORVASTATIN CALCIUM 20 MG PO TABS
20.0000 mg | ORAL_TABLET | Freq: Every day | ORAL | 1 refills | Status: DC
  Filled 2022-07-23 – 2022-07-29 (×3): qty 90, 90d supply, fill #0

## 2022-07-23 MED ORDER — GLIPIZIDE 5 MG PO TABS
5.0000 mg | ORAL_TABLET | Freq: Two times a day (BID) | ORAL | 1 refills | Status: DC
  Filled 2022-07-23: qty 180, 90d supply, fill #0

## 2022-07-23 MED ORDER — METFORMIN HCL 500 MG PO TABS
1000.0000 mg | ORAL_TABLET | Freq: Two times a day (BID) | ORAL | 1 refills | Status: DC
  Filled 2022-07-23: qty 360, 90d supply, fill #0

## 2022-07-23 MED ORDER — LANTUS SOLOSTAR 100 UNIT/ML ~~LOC~~ SOPN
15.0000 [IU] | PEN_INJECTOR | Freq: Every day | SUBCUTANEOUS | 6 refills | Status: DC
  Filled 2022-07-23: qty 12, 80d supply, fill #0

## 2022-07-24 ENCOUNTER — Other Ambulatory Visit: Payer: Self-pay

## 2022-07-28 ENCOUNTER — Ambulatory Visit (HOSPITAL_COMMUNITY)
Admission: EM | Admit: 2022-07-28 | Discharge: 2022-07-28 | Disposition: A | Discharge status: Home / Self Care | Payer: Medicaid Other | Attending: Internal Medicine | Admitting: Internal Medicine

## 2022-07-28 ENCOUNTER — Other Ambulatory Visit: Payer: Self-pay

## 2022-07-28 ENCOUNTER — Encounter (HOSPITAL_COMMUNITY): Payer: Self-pay | Admitting: *Deleted

## 2022-07-28 DIAGNOSIS — J069 Acute upper respiratory infection, unspecified: Secondary | ICD-10-CM

## 2022-07-28 MED ORDER — PREDNISONE 20 MG PO TABS
20.0000 mg | ORAL_TABLET | Freq: Every day | ORAL | 0 refills | Status: AC
  Filled 2022-07-28: qty 3, 3d supply, fill #0

## 2022-07-28 MED ORDER — BENZONATATE 100 MG PO CAPS
200.0000 mg | ORAL_CAPSULE | Freq: Three times a day (TID) | ORAL | 0 refills | Status: DC | PRN
  Filled 2022-07-28: qty 21, 4d supply, fill #0

## 2022-07-28 NOTE — Discharge Instructions (Signed)
Please maintain adequate hydration Take medications as prescribed Chest x-ray not indicated at this time Please return to urgent care if you have worsening symptoms.

## 2022-07-28 NOTE — ED Triage Notes (Signed)
Pt reports cough for unknown number of days. Pt has no other problems.

## 2022-07-29 ENCOUNTER — Other Ambulatory Visit: Payer: Self-pay

## 2022-07-30 NOTE — ED Provider Notes (Signed)
[facility]    CSN: [id number] Arrival date & time: 07/28/22  1219      History   Chief Complaint Chief Complaint  Patient presents with   Cough    HPI Meredith Ryan is a 62 y.o. female comes to the urgent care with several days history of cough.  Patient says the cough started insidiously and has been persistent.  Patient endorses some nasal congestion and runny nose.  She denies any sore throat.  Cough is not productive of sputum.  She denies any fever, chills, chest tightness or wheezing.  No chest pain or chest pressure.  No orthopnea, paroxysmal nocturnal dyspnea or lower extremity swelling.  Patient denies any sick contacts.  No dizziness.   HPI  Past Medical History:  Diagnosis Date   Anemia    DM type 2 (diabetes mellitus, type 2) ([place])    HLD (hyperlipidemia)     Patient Active Problem List   Diagnosis Date Noted   Early onset Alzheimer's dementia ([place]) 03/12/2022   Neuropathy 07/30/2020   Palpitations 03/21/2013   Pain in throat 10/12/2012   Insomnia 08/26/2010   ARTHRITIS, LEFT KNEE 01/25/2009   ARTHRITIS, LEFT HIP 01/15/2009   Hyperlipidemia 03/25/2006   IRON DEFIC ANEMIA [place] DIET IRON INTAKE 03/25/2006   Type 2 diabetes mellitus ([place]) 07/01/2005    History reviewed. No pertinent surgical history.  OB History     Gravida  5   Para      Term      Preterm      AB  1   Living  4      SAB  1   IAB      Ectopic      Multiple      Live Births               Home Medications    Prior to Admission medications   Medication Sig Start Date End Date Taking? Authorizing Provider  benzonatate (TESSALON) 100 MG capsule Take 2 capsules (200 mg total) by mouth 3 (three) times daily as needed for cough. 07/28/22  Yes Elverna Caffee, Myrene Galas, MD  predniSONE (DELTASONE) 20 MG tablet Take 1 tablet (20 mg total) by mouth daily for 3 days. 07/28/22 07/31/22 Yes England Greb, Myrene Galas, MD  atorvastatin (LIPITOR) 20 MG tablet Take 1 tablet (20 mg  total) by mouth daily. 07/23/22   Charlott Rakes, MD  Blood Glucose Monitoring Suppl (TRUE METRIX METER) w/Device KIT Use as instructed to check blood sugar at home. 03/10/22   Charlott Rakes, MD  diclofenac Sodium (VOLTAREN) 1 % GEL Apply 2 g topically 4 (four) times daily. 11/02/21   Mickie Hillier, PA-C  glipiZIDE (GLUCOTROL) 5 MG tablet Take 1 tablet (5 mg total) by mouth 2 (two) times daily before a meal. 07/23/22   Charlott Rakes, MD  glucose blood test strip USE AS INSTRUCTED 03/10/22 03/10/23  Charlott Rakes, MD  ibuprofen (ADVIL) 600 MG tablet Take 1 tablet (600 mg total) by mouth every 6 (six) hours as needed. 11/02/21   Mickie Hillier, PA-C  insulin glargine (LANTUS SOLOSTAR) 100 UNIT/ML Solostar Pen Inject 15 Units into the skin daily. 07/23/22   Charlott Rakes, MD  Insulin Pen Needle 31G X 5 MM MISC Use once daily at bedtime with insulin 03/10/22   Charlott Rakes, MD  memantine (NAMENDA) 10 MG tablet Take 1 tablet (10 mg total) by mouth 2 (two) times daily. 06/13/21 06/13/22  Rondel Jumbo, PA-C  metFORMIN (  GLUCOPHAGE) 500 MG tablet Take 2 tablets (1,000 mg total) by mouth 2 (two) times daily with a meal. 07/23/22   Charlott Rakes, MD  mirtazapine (REMERON) 30 MG tablet TAKE 1 TABLET (30 MG TOTAL) BY MOUTH AT BEDTIME. 05/06/21 05/06/22  Charlott Rakes, MD  TRUEplus Lancets 28G MISC Use three times daily with meals 03/10/22   Charlott Rakes, MD    Family History Family History  Problem Relation Age of Onset   Diabetes Mother    Diabetes Father     Social History Social History   Tobacco Use   Smoking status: Never   Smokeless tobacco: Never  Vaping Use   Vaping Use: Never used  Substance Use Topics   Alcohol use: No   Drug use: No     Allergies   Patient has no known allergies.   Review of Systems Review of Systems As per HPI  Physical Exam Triage Vital Signs ED Triage Vitals  Enc Vitals Group     BP 07/28/22 1438 130/83     Pulse Rate 07/28/22 1440 88      Resp 07/28/22 1438 18     Temp 07/28/22 1438 98.2 F (36.8 C)     Temp src --      SpO2 07/28/22 1438 99 %     Weight --      Height --      Head Circumference --      Peak Flow --      Pain Score --      Pain Loc --      Pain Edu? --      Excl. in [place]? --    No data found.  Updated Vital Signs BP 130/83   Pulse 88   Temp 98.2 F (36.8 C)   Resp 18   SpO2 99%   Visual Acuity Right Eye Distance:   Left Eye Distance:   Bilateral Distance:    Right Eye Near:   Left Eye Near:    Bilateral Near:     Physical Exam Vitals and nursing note reviewed.  Constitutional:      General: She is not in acute distress.    Appearance: She is not ill-appearing.  Cardiovascular:     Rate and Rhythm: Normal rate and regular rhythm.     Pulses: Normal pulses.     Heart sounds: Normal heart sounds.  Pulmonary:     Effort: Pulmonary effort is normal.     Breath sounds: Wheezing present.  Abdominal:     General: Abdomen is flat. Bowel sounds are normal.  Neurological:     Mental Status: She is alert.      UC Treatments / Results  Labs (all labs ordered are listed, but only abnormal results are displayed) Labs Reviewed - No data to display  EKG   Radiology No results found.  Procedures Procedures (including critical care time)  Medications Ordered in UC Medications - No data to display  Initial Impression / Assessment and Plan / UC Course  I have reviewed the triage vital signs and the nursing notes.  Pertinent labs & imaging results that were available during my care of the patient were reviewed by me and considered in my medical decision making (see chart for details).     1.  Viral URI with cough Patient advised to maintain adequate hydration Tessalon Perles as needed for cough Short course of prednisone given on account of chest tightness and wheezing in the lung bases Return precautions  given No chest x-ray indicated this time. Final Clinical Impressions(s)  / UC Diagnoses   Final diagnoses:  Viral URI with cough     Discharge Instructions      Please maintain adequate hydration Take medications as prescribed Chest x-ray not indicated at this time Please return to urgent care if you have worsening symptoms.   ED Prescriptions     Medication Sig Dispense Auth. Provider   predniSONE (DELTASONE) 20 MG tablet Take 1 tablet (20 mg total) by mouth daily for 3 days. 3 tablet Bentleigh Waren, Myrene Galas, MD   benzonatate (TESSALON) 100 MG capsule Take 2 capsules (200 mg total) by mouth 3 (three) times daily as needed for cough. 21 capsule Jwan Hornbaker, Myrene Galas, MD      PDMP not reviewed this encounter.   Chase Picket, MD 07/30/22 2223

## 2022-08-07 ENCOUNTER — Ambulatory Visit: Payer: Medicaid Other | Admitting: Physician Assistant

## 2022-08-08 ENCOUNTER — Other Ambulatory Visit: Payer: Self-pay

## 2023-10-09 ENCOUNTER — Other Ambulatory Visit: Payer: Self-pay

## 2023-10-09 ENCOUNTER — Encounter (HOSPITAL_COMMUNITY): Payer: Self-pay | Admitting: *Deleted

## 2023-10-09 ENCOUNTER — Ambulatory Visit (HOSPITAL_COMMUNITY)
Admission: EM | Admit: 2023-10-09 | Discharge: 2023-10-09 | Disposition: A | Discharge status: Home / Self Care | Attending: Family Medicine | Admitting: Family Medicine

## 2023-10-09 DIAGNOSIS — Z794 Long term (current) use of insulin: Secondary | ICD-10-CM | POA: Diagnosis present

## 2023-10-09 DIAGNOSIS — E1165 Type 2 diabetes mellitus with hyperglycemia: Secondary | ICD-10-CM | POA: Insufficient documentation

## 2023-10-09 DIAGNOSIS — E119 Type 2 diabetes mellitus without complications: Secondary | ICD-10-CM | POA: Diagnosis present

## 2023-10-09 DIAGNOSIS — E785 Hyperlipidemia, unspecified: Secondary | ICD-10-CM

## 2023-10-09 DIAGNOSIS — R413 Other amnesia: Secondary | ICD-10-CM | POA: Diagnosis present

## 2023-10-09 DIAGNOSIS — G4709 Other insomnia: Secondary | ICD-10-CM | POA: Insufficient documentation

## 2023-10-09 LAB — CBC WITH DIFFERENTIAL/PLATELET
Abs Immature Granulocytes: 0.02 10*3/uL (ref 0.00–0.07)
Basophils Absolute: 0 10*3/uL (ref 0.0–0.1)
Basophils Relative: 1 %
Eosinophils Absolute: 0.3 10*3/uL (ref 0.0–0.5)
Eosinophils Relative: 4 %
HCT: 38.6 % (ref 36.0–46.0)
Hemoglobin: 12.2 g/dL (ref 12.0–15.0)
Immature Granulocytes: 0 %
Lymphocytes Relative: 44 %
Lymphs Abs: 3.1 10*3/uL (ref 0.7–4.0)
MCH: 23.8 pg — ABNORMAL LOW (ref 26.0–34.0)
MCHC: 31.6 g/dL (ref 30.0–36.0)
MCV: 75.4 fL — ABNORMAL LOW (ref 80.0–100.0)
Monocytes Absolute: 0.4 10*3/uL (ref 0.1–1.0)
Monocytes Relative: 6 %
Neutro Abs: 3.2 10*3/uL (ref 1.7–7.7)
Neutrophils Relative %: 45 %
Platelets: 335 10*3/uL (ref 150–400)
RBC: 5.12 MIL/uL — ABNORMAL HIGH (ref 3.87–5.11)
RDW: 15.9 % — ABNORMAL HIGH (ref 11.5–15.5)
WBC: 7.1 10*3/uL (ref 4.0–10.5)
nRBC: 0 % (ref 0.0–0.2)

## 2023-10-09 LAB — LIPID PANEL
Cholesterol: 236 mg/dL — ABNORMAL HIGH (ref 0–200)
HDL: 54 mg/dL (ref >40)
LDL Cholesterol: 158 mg/dL — ABNORMAL HIGH (ref 0–99)
Total CHOL/HDL Ratio: 4.4 ratio
Triglycerides: 121 mg/dL (ref <150)
VLDL: 24 mg/dL (ref 0–40)

## 2023-10-09 LAB — COMPREHENSIVE METABOLIC PANEL WITH GFR
ALT: 16 U/L (ref 0–44)
AST: 22 U/L (ref 15–41)
Albumin: 3.7 g/dL (ref 3.5–5.0)
Alkaline Phosphatase: 81 U/L (ref 38–126)
Anion gap: 8 (ref 5–15)
BUN: 13 mg/dL (ref 8–23)
CO2: 25 mmol/L (ref 22–32)
Calcium: 9.2 mg/dL (ref 8.9–10.3)
Chloride: 102 mmol/L (ref 98–111)
Creatinine, Ser: 0.68 mg/dL (ref 0.44–1.00)
GFR, Estimated: >60 mL/min (ref >60)
Glucose, Bld: 235 mg/dL — ABNORMAL HIGH (ref 70–99)
Potassium: 4 mmol/L (ref 3.5–5.1)
Sodium: 135 mmol/L (ref 135–145)
Total Bilirubin: 0.6 mg/dL (ref 0.0–1.2)
Total Protein: 8.1 g/dL (ref 6.5–8.1)

## 2023-10-09 LAB — HEMOGLOBIN A1C
Hgb A1c MFr Bld: 9.6 % — ABNORMAL HIGH (ref 4.8–5.6)
Mean Plasma Glucose: 228.82 mg/dL

## 2023-10-09 MED ORDER — MEMANTINE HCL 10 MG PO TABS
10.0000 mg | ORAL_TABLET | Freq: Two times a day (BID) | ORAL | 1 refills | Status: DC
  Filled 2023-10-09: qty 60, 30d supply, fill #0

## 2023-10-09 MED ORDER — BLOOD GLUCOSE TEST VI STRP
1.0000 | ORAL_STRIP | Freq: Three times a day (TID) | 1 refills | Status: AC
  Filled 2023-10-09: qty 100, 30d supply, fill #0

## 2023-10-09 MED ORDER — GLIPIZIDE 5 MG PO TABS
5.0000 mg | ORAL_TABLET | Freq: Two times a day (BID) | ORAL | 1 refills | Status: DC
  Filled 2023-10-09: qty 60, 30d supply, fill #0

## 2023-10-09 MED ORDER — METFORMIN HCL 500 MG PO TABS
1000.0000 mg | ORAL_TABLET | Freq: Two times a day (BID) | ORAL | 1 refills | Status: DC
  Filled 2023-10-09: qty 120, 30d supply, fill #0

## 2023-10-09 MED ORDER — ATORVASTATIN CALCIUM 20 MG PO TABS
20.0000 mg | ORAL_TABLET | Freq: Every day | ORAL | 1 refills | Status: DC
  Filled 2023-10-09: qty 30, 30d supply, fill #0

## 2023-10-09 MED ORDER — INSULIN PEN NEEDLE 31G X 5 MM MISC
1.0000 | Freq: Every day | 1 refills | Status: AC
  Filled 2023-10-09: qty 30, 30d supply, fill #0

## 2023-10-09 MED ORDER — ACCU-CHEK SOFTCLIX LANCETS MISC
1.0000 | Freq: Three times a day (TID) | 1 refills | Status: AC
  Filled 2023-10-09: qty 100, 33d supply, fill #0

## 2023-10-09 MED ORDER — MIRTAZAPINE 30 MG PO TABS
ORAL_TABLET | ORAL | 1 refills | Status: DC
  Filled 2023-10-09: qty 30, 30d supply, fill #0

## 2023-10-09 MED ORDER — DICLOFENAC SODIUM 1 % EX GEL
2.0000 g | Freq: Four times a day (QID) | CUTANEOUS | 0 refills | Status: AC
  Filled 2023-10-09: qty 100, 13d supply, fill #0

## 2023-10-09 MED ORDER — LANTUS SOLOSTAR 100 UNIT/ML ~~LOC~~ SOPN
15.0000 [IU] | PEN_INJECTOR | Freq: Every day | SUBCUTANEOUS | 1 refills | Status: DC
  Filled 2023-10-09: qty 30, 200d supply, fill #0

## 2023-10-09 MED ORDER — IBUPROFEN 600 MG PO TABS
600.0000 mg | ORAL_TABLET | Freq: Four times a day (QID) | ORAL | 0 refills | Status: DC | PRN
  Filled 2023-10-09: qty 30, 8d supply, fill #0

## 2023-10-09 MED ORDER — BLOOD GLUCOSE MONITOR SYSTEM W/DEVICE KIT
1.0000 | PACK | Freq: Three times a day (TID) | 0 refills | Status: AC
  Filled 2023-10-09: qty 1, 30d supply, fill #0

## 2023-10-09 NOTE — ED Provider Notes (Signed)
 [facility]    CSN: [id number] Arrival date & time: 10/09/23  1348      History   Chief Complaint Chief Complaint  Patient presents with   Medication Refill    HPI Meredith Ryan is a 63 y.o. female.   Pts husband states they left the country to visit family and were stuck in [place] due to war. They have been without all meds for 2 months. They called PCP but can't be seen until 10/22/2023, they already have an appt set for that date.  They both deny any acute symptoms.     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 No pertinent surgical history.  OB History     Gravida  5   Para      Term      Preterm      AB  1   Living  4      SAB  1   IAB      Ectopic      Multiple      Live Births               Home Medications    Prior to Admission medications   Medication Sig Start Date End Date Taking? Authorizing Provider  Blood Glucose Monitoring Suppl (BLOOD GLUCOSE MONITOR SYSTEM) w/Device KIT Use to test once in the morning, at noon, and at bedtime. 10/09/23  Yes Guss Legacy, FNP  Glucose Blood (BLOOD GLUCOSE TEST STRIPS) STRP Use in the morning, at noon, and at bedtime. May substitute to any manufacturer covered by patient's insurance. 10/09/23  Yes Guss Legacy, FNP  atorvastatin  (LIPITOR) 20 MG tablet Take 1 tablet (20 mg total) by mouth daily. 10/09/23   Guss Legacy, FNP  diclofenac  Sodium  (VOLTAREN ) 1 % GEL Apply 2 g topically 4 (four) times daily. 10/09/23   Guss Legacy, FNP  glipiZIDE  (GLUCOTROL ) 5 MG tablet Take 1 tablet (5 mg total) by mouth 2 (two) times daily before a meal. 10/09/23   Guss Legacy, FNP  ibuprofen  (ADVIL ) 600 MG tablet Take 1 tablet (600 mg total) by mouth every 6 (six) hours as needed. 10/09/23   Guss Legacy, FNP  insulin  glargine (LANTUS  SOLOSTAR) 100 UNIT/ML Solostar Pen Inject 15 Units into the skin daily. 10/09/23   Guss Legacy, FNP  Insulin  Pen Needle 31G X 5 MM MISC Use once daily at bedtime with insulin  10/09/23   Guss Legacy, FNP  memantine  (NAMENDA ) 10 MG tablet Take 1 tablet (10 mg total) by mouth 2 (two) times daily. 10/09/23 10/08/24  Guss Legacy, FNP  metFORMIN  (GLUCOPHAGE ) 500 MG tablet Take 2 tablets (1,000 mg total) by mouth 2 (two) times daily with a meal. 10/09/23   Guss Legacy, FNP  mirtazapine  (REMERON ) 30 MG tablet TAKE 1 TABLET (30 MG TOTAL) BY MOUTH AT BEDTIME. 10/09/23 10/08/24  Guss Legacy, FNP  Accu-Chek Softclix Lancets lancets 1  each 3 (three) times daily before meals. 10/09/23   Guss Legacy, FNP    Family History Family History  Problem Relation Age of Onset   Diabetes Mother    Diabetes Father     Social History Social History   Tobacco Use   Smoking status: Never   Smokeless tobacco: Never  Vaping Use   Vaping status: Never Used  Substance Use Topics   Alcohol use: No   Drug use: No     Allergies   Patient has no known allergies.   Review of Systems Review of Systems  Constitutional:  Negative for fever.  Respiratory:  Negative for cough.   Cardiovascular:  Negative for chest pain.  Gastrointestinal:  Negative for abdominal pain, constipation, diarrhea, nausea and vomiting.  Musculoskeletal:  Negative for arthralgias and back pain.  Skin:  Negative for color change and rash.  Neurological:  Negative for syncope.  All other systems reviewed and are negative.    Physical Exam Triage Vital  Signs ED Triage Vitals  Encounter Vitals Group     BP 10/09/23 1517 (!) 155/95     Systolic BP Percentile --      Diastolic BP Percentile --      Pulse Rate 10/09/23 1517 89     Resp 10/09/23 1517 18     Temp 10/09/23 1517 98.5 F (36.9 C)     Temp Source 10/09/23 1517 Oral     SpO2 10/09/23 1517 100 %     Weight --      Height --      Head Circumference --      Peak Flow --      Pain Score 10/09/23 1516 0     Pain Loc --      Pain Education --      Exclude from Growth Chart --    No data found.  Updated Vital Signs BP (!) 155/95 (BP Location: Right Arm)   Pulse 89   Temp 98.5 F (36.9 C) (Oral)   Resp 18   SpO2 100%   Visual Acuity Right Eye Distance:   Left Eye Distance:   Bilateral Distance:    Right Eye Near:   Left Eye Near:    Bilateral Near:     Physical Exam Vitals and nursing note reviewed.  Constitutional:      General: She is not in acute distress.    Appearance: She is well-developed. She is not ill-appearing or toxic-appearing.  HENT:     Head: Normocephalic and atraumatic.     Right Ear: Hearing, tympanic membrane, ear canal and external ear normal.     Left Ear: Hearing, tympanic membrane, ear canal and external ear normal.     Nose: No congestion or rhinorrhea.     Right Sinus: No maxillary sinus tenderness or frontal sinus tenderness.     Left Sinus: No maxillary sinus tenderness or frontal sinus tenderness.     Mouth/Throat:     Lips: Pink.     Mouth: Mucous membranes are moist.     Pharynx: Uvula midline. No oropharyngeal exudate or posterior oropharyngeal erythema.     Tonsils: No tonsillar exudate.  Eyes:     Conjunctiva/sclera: Conjunctivae normal.     Pupils: Pupils are equal, round, and reactive to light.  Cardiovascular:     Rate and Rhythm: Normal rate and regular rhythm.     Heart sounds: S1 normal and S2 normal. No murmur heard. Pulmonary:     Effort: Pulmonary  effort is normal. No respiratory distress.     Breath sounds:  Normal breath sounds. No decreased breath sounds, wheezing, rhonchi or rales.  Abdominal:     General: Bowel sounds are normal.     Palpations: Abdomen is soft.     Tenderness: There is no abdominal tenderness.  Musculoskeletal:        General: No swelling.     Cervical back: Neck supple.  Lymphadenopathy:     Head:     Right side of head: No submental, submandibular, tonsillar, preauricular or posterior auricular adenopathy.     Left side of head: No submental, submandibular, tonsillar, preauricular or posterior auricular adenopathy.     Cervical: No cervical adenopathy.     Right cervical: No superficial cervical adenopathy.    Left cervical: No superficial cervical adenopathy.  Skin:    General: Skin is warm and dry.     Capillary Refill: Capillary refill takes less than 2 seconds.     Findings: No rash.  Neurological:     Mental Status: She is alert and oriented to person, place, and time.  Psychiatric:        Mood and Affect: Mood normal.      UC Treatments / Results  Labs (all labs ordered are listed, but only abnormal results are displayed) Labs Reviewed  CBC WITH DIFFERENTIAL/PLATELET  COMPREHENSIVE METABOLIC PANEL WITH GFR  LIPID PANEL  HEMOGLOBIN A1C    EKG   Radiology No results found.  Procedures Procedures (including critical care time)  Medications Ordered in UC Medications - No data to display  Initial Impression / Assessment and Plan / UC Course  I have reviewed the triage vital signs and the nursing notes.  Pertinent labs & imaging results that were available during my care of the patient were reviewed by me and considered in my medical decision making (see chart for details).     Plan of Care: Type 2 diabetes with insulin  use: Patient has not had blood work nor seen primary care in over a year.  He has an appointment with primary care in May but needs to get back on medication.  All medications renewed.  Labs ordered and done but patient was  nonfasting.  Will adjust his plan of care, if needed once his labs result.  Labs will be available for PCP review if needed.  Renewed all current medications. Hyperlipidemia: See above for diabetes.  Labs are pending.  Renewed blood pressure medications. Memory changes: Refilled her amantadine prescription.  Follow-up as planned.  Follow-up with primary care in May as planned, return here if needed.  Will alert patient of her test results as soon as available. Final Clinical Impressions(s) / UC Diagnoses   Final diagnoses:  Controlled type 2 diabetes mellitus with insulin  therapy (HCC)  Memory changes  Hyperlipidemia, unspecified hyperlipidemia type     Discharge Instructions      Medications reviewed.  Lab work is pending and will be available for primary care to also review.  Lab work was done nonfasting.  Follow-up with primary care in May as planned.  Return here if needed.     ED Prescriptions     Medication Sig Dispense Auth. Provider   atorvastatin  (LIPITOR) 20 MG tablet Take 1 tablet (20 mg total) by mouth daily. 30 tablet Jlon Betker, FNP   diclofenac  Sodium (VOLTAREN ) 1 % GEL Apply 2 g topically 4 (four) times daily. 150 g Guss Legacy, FNP   glipiZIDE  (GLUCOTROL ) 5 MG tablet Take  1 tablet (5 mg total) by mouth 2 (two) times daily before a meal. 60 tablet Guss Legacy, FNP   ibuprofen  (ADVIL ) 600 MG tablet Take 1 tablet (600 mg total) by mouth every 6 (six) hours as needed. 30 tablet Sandrina Heaton, FNP   insulin  glargine (LANTUS  SOLOSTAR) 100 UNIT/ML Solostar Pen Inject 15 Units into the skin daily. 30 mL Guss Legacy, FNP   Insulin  Pen Needle 31G X 5 MM MISC Use once daily at bedtime with insulin  30 each Guss Legacy, FNP   memantine  (NAMENDA ) 10 MG tablet Take 1 tablet (10 mg total) by mouth 2 (two) times daily. 60 tablet Tilak Oakley, FNP   metFORMIN  (GLUCOPHAGE ) 500 MG tablet Take 2 tablets (1,000 mg total) by mouth 2 (two) times daily with a meal. 120 tablet  Guss Legacy, FNP   mirtazapine  (REMERON ) 30 MG tablet TAKE 1 TABLET (30 MG TOTAL) BY MOUTH AT BEDTIME. 30 tablet Thresia Ramanathan, FNP   Accu-Chek Softclix Lancets lancets 1 each 3 (three) times daily before meals. 100 each Guss Legacy, FNP   Blood Glucose Monitoring Suppl (BLOOD GLUCOSE MONITOR SYSTEM) w/Device KIT Use to test once in the morning, at noon, and at bedtime. 1 kit Guss Legacy, FNP   Glucose Blood (BLOOD GLUCOSE TEST STRIPS) STRP Use in the morning, at noon, and at bedtime. May substitute to any manufacturer covered by patient's insurance. 100 strip Guss Legacy, FNP      PDMP not reviewed this encounter.   Guss Legacy, FNP 10/09/23 1655

## 2023-10-09 NOTE — ED Triage Notes (Signed)
 Pts husband states they left the country to visit family and were stuck in [place] due to war. They have been without all meds for 2 months. They called PCP but can't be seen until 10/22/2023, they already have an appt set for that date.

## 2023-10-09 NOTE — Discharge Instructions (Signed)
 Medications reviewed.  Lab work is pending and will be available for primary care to also review.  Lab work was done nonfasting.  Follow-up with primary care in May as planned.  Return here if needed.

## 2023-10-12 ENCOUNTER — Other Ambulatory Visit: Payer: Self-pay

## 2023-10-12 NOTE — Progress Notes (Signed)
 CBC with differential is essentially normal and unchanged over the last 10 years.  Hemoglobin A1c is 9.6% and last one on file was 9.5% (3 years ago).  She has been over 2-1/2 months without any medications for chronic health problems nor diabetes.  Chemistry panel or CMP was normal except her blood sugar was 235.  Lipids showed an elevated total cholesterol and elevated LDL cholesterol but her triglycerides and HDL were normal.  And she has not had any medicine for her lipids and almost 3 months.  Patient does not have great English and her husband was updated on these results over the phone.  She would like copies so she was instructed that she could come to [facility] health urgent care [facility] and request copies to take to primary care.

## 2023-10-19 ENCOUNTER — Telehealth: Payer: Self-pay

## 2023-10-19 NOTE — Telephone Encounter (Signed)
 Copied from CRM [id number]. Topic: General - Other >> Oct 19, 2023  5:12 PM Star [place] wrote: Reason for CRM: [place] [facility]- CAP Program- has a form that needs to be filled out- faxing over to office- daughter called -  [phone]

## 2023-10-20 NOTE — Telephone Encounter (Signed)
 Noted.

## 2023-10-20 NOTE — Telephone Encounter (Signed)
 FYI,  I will give you the form once its received.

## 2023-10-22 ENCOUNTER — Ambulatory Visit: Admitting: Physician Assistant

## 2023-10-27 ENCOUNTER — Telehealth: Payer: Self-pay | Admitting: Family Medicine

## 2023-10-27 NOTE — Telephone Encounter (Signed)
 A Transportation voucher has been completed for this patient for her upcoming appointment 10/28/2023 at 9:10 am. The cab is scheduled to pick the patient up at 8:50 am. The patient acknowledged.

## 2023-10-28 ENCOUNTER — Encounter: Payer: Self-pay | Admitting: Family Medicine

## 2023-10-28 ENCOUNTER — Ambulatory Visit: Attending: Family Medicine | Admitting: Family Medicine

## 2023-10-28 VITALS — BP 134/81 | HR 89 | Ht 64.0 in | Wt 141.0 lb

## 2023-10-28 DIAGNOSIS — Z794 Long term (current) use of insulin: Secondary | ICD-10-CM

## 2023-10-28 DIAGNOSIS — F02B0 Dementia in other diseases classified elsewhere, moderate, without behavioral disturbance, psychotic disturbance, mood disturbance, and anxiety: Secondary | ICD-10-CM | POA: Diagnosis not present

## 2023-10-28 DIAGNOSIS — G300 Alzheimer's disease with early onset: Secondary | ICD-10-CM | POA: Diagnosis not present

## 2023-10-28 DIAGNOSIS — E1149 Type 2 diabetes mellitus with other diabetic neurological complication: Secondary | ICD-10-CM

## 2023-10-28 DIAGNOSIS — G4709 Other insomnia: Secondary | ICD-10-CM

## 2023-10-28 DIAGNOSIS — E1165 Type 2 diabetes mellitus with hyperglycemia: Secondary | ICD-10-CM | POA: Diagnosis not present

## 2023-10-28 DIAGNOSIS — Z7984 Long term (current) use of oral hypoglycemic drugs: Secondary | ICD-10-CM

## 2023-10-28 DIAGNOSIS — E785 Hyperlipidemia, unspecified: Secondary | ICD-10-CM

## 2023-10-28 MED ORDER — METFORMIN HCL 500 MG PO TABS: 1000.0000 mg | ORAL_TABLET | Freq: Two times a day (BID) | ORAL | 1 refills | Status: DC

## 2023-10-28 MED ORDER — MEMANTINE HCL 10 MG PO TABS: 10.0000 mg | ORAL_TABLET | Freq: Two times a day (BID) | ORAL | 1 refills | Status: DC

## 2023-10-28 MED ORDER — LANTUS SOLOSTAR 100 UNIT/ML ~~LOC~~ SOPN: 10.0000 [IU] | PEN_INJECTOR | Freq: Every day | SUBCUTANEOUS | 6 refills | Status: AC

## 2023-10-28 MED ORDER — GLIPIZIDE 5 MG PO TABS: 5.0000 mg | ORAL_TABLET | Freq: Two times a day (BID) | ORAL | 1 refills | Status: AC

## 2023-10-28 MED ORDER — ATORVASTATIN CALCIUM 20 MG PO TABS: 20.0000 mg | ORAL_TABLET | Freq: Every day | ORAL | 1 refills | Status: DC

## 2023-10-28 MED ORDER — MIRTAZAPINE 30 MG PO TABS: ORAL_TABLET | ORAL | 1 refills | Status: DC

## 2023-10-28 MED ORDER — GABAPENTIN 300 MG PO CAPS
300.0000 mg | ORAL_CAPSULE | Freq: Every day | ORAL | 1 refills | Status: DC
Start: 2023-10-28 — End: 2024-01-28

## 2023-10-28 NOTE — Telephone Encounter (Signed)
 A return to home transportation voucher has been completed for this patient. The cab is scheduled to pick the patient up on 10/28/2023 at 10:23 am.

## 2023-10-28 NOTE — Telephone Encounter (Addendum)
 I met with the patient and her daughter when they were in the clinic today.  Her daughter is requesting CAP services and has the documentation with her that needs to be completed. I explained that there can be a wait for CAP services, possibly 6+ months and inquired if they would be interested in [facility] in the meantime. I explained that her daughter cannot be paid for those services as she can with CAP; but it would provide some care giving relief for her and she was in agreement to placing that referral.   The patient's CAP/DA caseworker, Armin Landing, called patient's daughter while they were in the clinic and I was able to speak with her.  Armin Landing explained that the patient had been receiving CAP services prior to leaving the country and they would like to re-start the services but need a new referral.  Armin Landing went on to explain that if the patient has a diagnosis of Alzheimer's Disease, she would be bumped up to the top of the wait list.  She said she will email a document attesting to the Alzheimer's Disease that needs to be included with the CAP application.  Meredith Ryan's contact # [phone]  Dr Newlin - I will prepare the documents for you to sign

## 2023-10-28 NOTE — Patient Instructions (Signed)
 VISIT SUMMARY:  Today, you were seen for concerns about sleep disturbances and medication management. You were accompanied by your daughter, who is also your primary caregiver. We discussed your Alzheimer's disease, diabetes management, sleep issues, and foot pain. Adjustments were made to your medications, and a statement of capability for Social Security benefits was provided.  YOUR PLAN:  -TYPE 2 DIABETES MELLITUS WITH HYPERGLYCEMIA: Your diabetes is not well controlled, as shown by your recent A1c of 9.6. This means your blood sugar levels have been too high. We have refilled your prescriptions for metformin , glipizide , and insulin . Please take metformin  2 tablets twice daily, glipizide  1 tablet twice daily, and Lantus  10 units nightly. Monitor your blood glucose levels, aiming for around 100 mg/dL before meals. If your blood glucose is below 80 mg/dL, decrease your Lantus  dose by 2 units. Consistent medication use is very important.  -DIABETIC NEUROPATHY: The numbness and 'pins and needles' pain in your feet are likely due to diabetic neuropathy, which is nerve damage caused by high blood sugar levels. We have prescribed gabapentin  to help manage the pain and aid your sleep. Please take 1 capsule at night.  -MODERATE EARLY ONSET ALZHEIMER'S DEMENTIA: Your Alzheimer's disease may be progressing, as shown by your nocturnal flashbacks and talking. We have restarted mirtazapine  at 30 mg at bedtime to help with sleep and appetite. Continue taking memantine  10 mg twice daily. If you experience agitation, we may need to adjust your medications.  -INSOMNIA: You have difficulty falling asleep and often stay awake until 4 AM. Restarting mirtazapine  at 30 mg at bedtime should help you sleep better and improve your appetite.  -GENERAL HEALTH MAINTENANCE: We have prepared and provided a statement of capability for Social Security benefits due to your inability to manage finances and  work.  INSTRUCTIONS:  Please follow the medication instructions carefully and monitor your blood glucose levels as discussed. If your blood glucose is below 80 mg/dL, decrease your Lantus  dose by 2 units. Restart mirtazapine  at 30 mg at bedtime to help with sleep and appetite. Take gabapentin  1 capsule at night for foot pain. Continue taking memantine  10 mg twice daily. If you experience any agitation, please contact us  as we may need to adjust your medications. We have provided a statement of capability for Social Security benefits. Follow up with us  if you have any concerns or if your symptoms change.

## 2023-10-28 NOTE — Progress Notes (Signed)
 Subjective:  Patient ID: Meredith Ryan, female    DOB: Dec 10, 1960  Age: 63 y.o. MRN: [id number]  CC: Medical Management of Chronic Issues (Discuss Alzheimer's/Not sleeping/Depression/Pain in feet /)     Discussed the use of AI scribe software for clinical note transcription with the patient, who gave verbal consent to proceed.  History of Present Illness Meredith Ryan is a 63 year old female with type 2 diabetes mellitus, hyperlipidemia Alzheimer's disease who presents with concerns about sleep disturbances and medication management. She is accompanied by her daughter, who is also her primary caregiver. Last visit to the clinic was in 2023 and per daughter she had traveled back to [place] where she had been for over a year.  She experiences significant sleep disturbances, with difficulty falling and staying asleep, often not sleeping until 4 AM. She has nocturnal episodes where she talks as if reliving old memories. Her behavior has changed, becoming very quiet at home.  Daughter has also noticed she sometimes gets a little bit agitated at home and is wondering if any of her medications could be causing this.  She is prescribed memantine  10 mg twice daily for Alzheimer's disease. She has not been taking mirtazapine , prescribed for insomnia and appetite stimulation, due to a lack of understanding of its purpose.  Her diabetes is poorly controlled, with a recent A1c of 9.6. She takes metformin , glipizide , and Lantus . She ran out of metformin  and has been using her husband's supply. Her appetite decreases with glipizide  and metformin .  She experiences numbness and 'pins and needles' pain in her feet. She remains active, following her daughter around the house, which may contribute to her foot pain.  She is unable to manage her finances and is requiring a letter stating this.  She is also requesting CAP Services.    Past Medical History:  Diagnosis Date   Anemia    DM type 2 (diabetes  mellitus, type 2) (HCC)    HLD (hyperlipidemia)     No past surgical history on file.  Family History  Problem Relation Age of Onset   Diabetes Mother    Diabetes Father     Social History   Socioeconomic History   Marital status: Married    Spouse name: Not on file   Number of children: Not on file   Years of education: Not on file   Highest education level: Not on file  Occupational History   Not on file  Tobacco Use   Smoking status: Never   Smokeless tobacco: Never  Vaping Use   Vaping status: Never Used  Substance and Sexual Activity   Alcohol use: No   Drug use: No   Sexual activity: Yes  Other Topics Concern   Not on file  Social History Narrative   Lives with husband, 3 children. (23, 19, 92) Oldest daughter married.    Was working at [employer], but laid off.   Right hand   Social Drivers of Health   Financial Resource Strain: Medium Risk (10/28/2023)   Overall Financial Resource Strain (CARDIA)    Difficulty of Paying Living Expenses: Somewhat hard  Food Insecurity: Food Insecurity Present (10/28/2023)   Hunger Vital Sign    Worried About Running Out of Food in the Last Year: Often true    Ran Out of Food in the Last Year: Often true  Transportation Needs: No Transportation Needs (10/28/2023)   PRAPARE - Administrator, Civil Service (Medical): No    Lack of  Transportation (Non-Medical): No  Physical Activity: Sufficiently Active (10/28/2023)   Exercise Vital Sign    Days of Exercise per Week: 7 days    Minutes of Exercise per Session: 60 min  Stress: No Stress Concern Present (10/28/2023)   [employer] of Occupational Health - Occupational Stress Questionnaire    Feeling of Stress : Not at all  Social Connections: Moderately Integrated (10/28/2023)   Social Connection and Isolation Panel [NHANES]    Frequency of Communication with Friends and Family: More than three times a week    Frequency of Social Gatherings with Friends and  Family: More than three times a week    Attends Religious Services: 1 to 4 times per year    Active Member of [employer] or Organizations: No    Attends Banker Meetings: Never    Marital Status: Married    No Known Allergies  Outpatient Medications Prior to Visit  Medication Sig Dispense Refill   Glucose Blood (BLOOD GLUCOSE TEST STRIPS) STRP Use in the morning, at noon, and at bedtime. May substitute to any manufacturer covered by patient's insurance. 100 strip 1   ibuprofen  (ADVIL ) 600 MG tablet Take 1 tablet (600 mg total) by mouth every 6 (six) hours as needed. 30 tablet 0   Insulin  Pen Needle 31G X 5 MM MISC Use once daily at bedtime with insulin  30 each 1   insulin  glargine (LANTUS  SOLOSTAR) 100 UNIT/ML Solostar Pen Inject 15 Units into the skin daily. 30 mL 1   memantine  (NAMENDA ) 10 MG tablet Take 1 tablet (10 mg total) by mouth 2 (two) times daily. 60 tablet 1   metFORMIN  (GLUCOPHAGE ) 500 MG tablet Take 2 tablets (1,000 mg total) by mouth 2 (two) times daily with a meal. 120 tablet 1   Blood Glucose Monitoring Suppl (BLOOD GLUCOSE MONITOR SYSTEM) w/Device KIT Use to test once in the morning, at noon, and at bedtime. 1 kit 0   diclofenac  Sodium (VOLTAREN ) 1 % GEL Apply 2 g topically 4 (four) times daily. (Patient not taking: Reported on 10/28/2023) 150 g 0   Accu-Chek Softclix Lancets lancets 1 each 3 (three) times daily before meals. 100 each 1   atorvastatin  (LIPITOR) 20 MG tablet Take 1 tablet (20 mg total) by mouth daily. (Patient not taking: Reported on 10/28/2023) 30 tablet 1   glipiZIDE  (GLUCOTROL ) 5 MG tablet Take 1 tablet (5 mg total) by mouth 2 (two) times daily before a meal. (Patient not taking: Reported on 10/28/2023) 60 tablet 1   mirtazapine  (REMERON ) 30 MG tablet TAKE 1 TABLET (30 MG TOTAL) BY MOUTH AT BEDTIME. (Patient not taking: Reported on 10/28/2023) 30 tablet 1   No facility-administered medications prior to visit.     ROS Review of Systems   Constitutional:  Negative for activity change and appetite change.  HENT:  Negative for sinus pressure and sore throat.   Respiratory:  Negative for chest tightness, shortness of breath and wheezing.   Cardiovascular:  Negative for chest pain and palpitations.  Gastrointestinal:  Negative for abdominal distention, abdominal pain and constipation.  Genitourinary: Negative.   Musculoskeletal: Negative.   Psychiatric/Behavioral:  Positive for dysphoric mood. Negative for behavioral problems.     Objective:  BP 134/81   Pulse 89   Ht 5\' 4"  (1.626 m)   Wt 141 lb (64 kg)   SpO2 100%   BMI 24.20 kg/m      10/28/2023    9:20 AM 10/09/2023    3:17 PM 07/28/2022  2:38 PM  BP/Weight  Systolic BP 134 155 130  Diastolic BP 81 95 83  Wt. (Lbs) 141    BMI 24.2 kg/m2        Physical Exam Constitutional:      Appearance: She is well-developed.  Cardiovascular:     Rate and Rhythm: Normal rate.     Heart sounds: Normal heart sounds. No murmur heard. Pulmonary:     Effort: Pulmonary effort is normal.     Breath sounds: Normal breath sounds. No wheezing or rales.  Chest:     Chest wall: No tenderness.  Abdominal:     General: Bowel sounds are normal. There is no distension.     Palpations: Abdomen is soft. There is no mass.     Tenderness: There is no abdominal tenderness.  Musculoskeletal:        General: Normal range of motion.     Right lower leg: No edema.     Left lower leg: No edema.  Neurological:     Mental Status: She is alert and oriented to person, place, and time.  Psychiatric:        Mood and Affect: Mood normal.        Latest Ref Rng & Units 10/09/2023    4:27 PM 03/10/2022   11:15 AM 06/06/2021    9:25 AM  CMP  Glucose 70 - 99 mg/dL 102  725  366   BUN 8 - 23 mg/dL 13  12  9    Creatinine 0.44 - 1.00 mg/dL 4.40  3.47  4.25   Sodium 135 - 145 mmol/L 135  132  137   Potassium 3.5 - 5.1 mmol/L 4.0  4.7  5.1   Chloride 98 - 111 mmol/L 102  95  101   CO2 22  - 32 mmol/L 25  22  22    Calcium  8.9 - 10.3 mg/dL 9.2  9.8  9.8   Total Protein 6.5 - 8.1 g/dL 8.1  7.6    Total Bilirubin 0.0 - 1.2 mg/dL 0.6  0.3    Alkaline Phos 38 - 126 U/L 81  118    AST 15 - 41 U/L 22  18    ALT 0 - 44 U/L 16  15      Lipid Panel     Component Value Date/Time   CHOL 236 (H) 10/09/2023 1627   CHOL 174 03/10/2022 1115   TRIG 121 10/09/2023 1627   HDL 54 10/09/2023 1627   HDL 61 03/10/2022 1115   CHOLHDL 4.4 10/09/2023 1627   VLDL 24 10/09/2023 1627   LDLCALC 158 (H) 10/09/2023 1627   LDLCALC 97 03/10/2022 1115    CBC    Component Value Date/Time   WBC 7.1 10/09/2023 1627   RBC 5.12 (H) 10/09/2023 1627   HGB 12.2 10/09/2023 1627   HGB 13.6 05/22/2020 0906   HCT 38.6 10/09/2023 1627   HCT 43.3 05/22/2020 0906   PLT 335 10/09/2023 1627   PLT 350 05/22/2020 0906   MCV 75.4 (L) 10/09/2023 1627   MCV 78 (L) 05/22/2020 0906   MCH 23.8 (L) 10/09/2023 1627   MCHC 31.6 10/09/2023 1627   RDW 15.9 (H) 10/09/2023 1627   RDW 15.5 (H) 05/22/2020 0906   LYMPHSABS 3.1 10/09/2023 1627   LYMPHSABS 3.0 05/22/2020 0906   MONOABS 0.4 10/09/2023 1627   EOSABS 0.3 10/09/2023 1627   EOSABS 0.3 05/22/2020 0906   BASOSABS 0.0 10/09/2023 1627   BASOSABS 0.1 05/22/2020 0906  Lab Results  Component Value Date   HGBA1C 9.6 (H) 10/09/2023       Assessment & Plan Type 2 diabetes mellitus with hyperglycemia Diabetes poorly controlled with A1c of 9.6 due to inconsistent medication use. Concern about decreased appetite with glipizide  affecting glucose levels. - Refilled metformin , glipizide , and insulin  prescriptions. - Instructed to take metformin  2 tablets twice daily, glipizide  1 tablet twice daily, and Lantus  10 units nightly. - Monitor blood glucose levels, target 100 +/- 20 mg/dL fasting - Decrease Lantus  by 2 units if blood glucose <80 mg/dL. - Educated on consistent medication adherence. -Counseled on Diabetic diet, my plate method, 960 minutes of moderate  intensity exercise/week Blood sugar logs with fasting goals of 80-120 mg/dl, random of less than 454 and in the event of sugars less than 60 mg/dl or greater than 098 mg/dl encouraged to notify the clinic. Advised on the need for annual eye exams, annual foot exams, Pneumonia vaccine.   Diabetic neuropathy Numbness and paresthesia in feet likely due to diabetic neuropathy. Gabapentin  expected to manage pain and aid sleep. - Prescribed gabapentin  1 capsule at night.  Moderate early onset Alzheimer's dementia Nocturnal flashbacks and talking suggest possible progression. Mirtazapine  not taken previously. Medication elimination trial planned if agitation occurs. - Restart mirtazapine  30 mg at bedtime. - Continue memantine  10 mg twice daily. - Perform medication elimination trial if agitation occurs, starting with mirtazapine .  Insomnia Difficulty falling asleep, often awake until 4 AM. Mirtazapine  expected to aid sleep and improve appetite. - Restart mirtazapine  30 mg at bedtime.  General Health Maintenance Requires statement of capability for Social Security benefits due to inability to manage finances and work. - Prepared and provided statement of capability for Social Security benefits. - Case manager to assist with care management regarding CAP Services        Meds ordered this encounter  Medications   atorvastatin  (LIPITOR) 20 MG tablet    Sig: Take 1 tablet (20 mg total) by mouth daily.    Dispense:  90 tablet    Refill:  1   glipiZIDE  (GLUCOTROL ) 5 MG tablet    Sig: Take 1 tablet (5 mg total) by mouth 2 (two) times daily before a meal.    Dispense:  180 tablet    Refill:  1   insulin  glargine (LANTUS  SOLOSTAR) 100 UNIT/ML Solostar Pen    Sig: Inject 10 Units into the skin daily.    Dispense:  30 mL    Refill:  6   memantine  (NAMENDA ) 10 MG tablet    Sig: Take 1 tablet (10 mg total) by mouth 2 (two) times daily.    Dispense:  180 tablet    Refill:  1   metFORMIN   (GLUCOPHAGE ) 500 MG tablet    Sig: Take 2 tablets (1,000 mg total) by mouth 2 (two) times daily with a meal.    Dispense:  360 tablet    Refill:  1   mirtazapine  (REMERON ) 30 MG tablet    Sig: TAKE 1 TABLET (30 MG TOTAL) BY MOUTH AT BEDTIME.    Dispense:  30 tablet    Refill:  1   gabapentin  (NEURONTIN ) 300 MG capsule    Sig: Take 1 capsule (300 mg total) by mouth at bedtime.    Dispense:  90 capsule    Refill:  1    Follow-up: Return in about 3 months (around 01/28/2024) for Chronic medical conditions.       Joaquin Mulberry, MD, FAAFP. [facility]  [facility] [place], [place] [phone]   10/28/2023, 12:55 PM

## 2023-10-29 NOTE — Telephone Encounter (Signed)
 Completed PCS and CAP referrals to Dr Newlin for signature.  Still waiting for additional document from [place], CAP/DA

## 2023-11-02 ENCOUNTER — Telehealth: Payer: Self-pay

## 2023-11-02 NOTE — Telephone Encounter (Signed)
 I spoke to patient's daughter, [name], and she was checking of the status of the referrals for PCS and CAP.  I told her that the documents are ready for Dr Adan Holms to sign and I will send them to the appropriate agencies when they are signed.

## 2023-11-02 NOTE — Telephone Encounter (Signed)
 Copied from CRM [id number]. Topic: General - Other >> Nov 02, 2023 12:51 PM Lotus Round B wrote: Reason for CRM: pt called in to see if Meredith Ryan can give her a call back . She wouldn't state what it was about but besides it was urgent . If [name] can please give the patient a call back

## 2023-11-04 NOTE — Telephone Encounter (Signed)
 Signed PCS request faxed to [employer]: [phone].  Completed CAP application and request for priority consideration faxed to [phone] and emailed to [place], MSW- Aging, Disability and [employer]  I called patient's daughter, [name] and informed her that the referrals have been faxed as noted above.   She said she will follow up with [place]. She then asked me about signing her mother up for Medicare and I provided her with the phone number for [facility] [place] counselor: [phone] x  and she said she will call right away as her mother turns 63 in 4 days.

## 2024-01-13 ENCOUNTER — Telehealth: Payer: Self-pay | Admitting: Family Medicine

## 2024-01-13 NOTE — Telephone Encounter (Signed)
 Copied from CRM [id number]. Topic: Clinical - Prescription Issue >> Jan 13, 2024  9:03 AM Selinda RAMAN wrote:  Reason for CRM: Zia with [facility] called in needing to speak with a nurse about a prescription for a commode extender. She says she never received a weight or height for the patient and they need that. Please assist as soon as possible by calling [phone]

## 2024-01-14 NOTE — Telephone Encounter (Signed)
 Meredith Ryan was called and informed of patient height and weight.

## 2024-01-27 ENCOUNTER — Telehealth: Payer: Self-pay | Admitting: Family Medicine

## 2024-01-27 NOTE — Telephone Encounter (Signed)
 Pt confirmed appt 8/13 per volunteer

## 2024-01-28 ENCOUNTER — Other Ambulatory Visit: Payer: Self-pay

## 2024-01-28 ENCOUNTER — Ambulatory Visit: Attending: Family Medicine | Admitting: Family Medicine

## 2024-01-28 ENCOUNTER — Encounter: Payer: Self-pay | Admitting: Family Medicine

## 2024-01-28 VITALS — BP 125/82 | HR 85 | Ht 64.0 in | Wt 137.4 lb

## 2024-01-28 DIAGNOSIS — R1310 Dysphagia, unspecified: Secondary | ICD-10-CM | POA: Diagnosis not present

## 2024-01-28 DIAGNOSIS — G300 Alzheimer's disease with early onset: Secondary | ICD-10-CM

## 2024-01-28 DIAGNOSIS — Z79899 Other long term (current) drug therapy: Secondary | ICD-10-CM | POA: Diagnosis not present

## 2024-01-28 DIAGNOSIS — E785 Hyperlipidemia, unspecified: Secondary | ICD-10-CM | POA: Diagnosis not present

## 2024-01-28 DIAGNOSIS — M79606 Pain in leg, unspecified: Secondary | ICD-10-CM | POA: Insufficient documentation

## 2024-01-28 DIAGNOSIS — G8929 Other chronic pain: Secondary | ICD-10-CM | POA: Insufficient documentation

## 2024-01-28 DIAGNOSIS — Z7984 Long term (current) use of oral hypoglycemic drugs: Secondary | ICD-10-CM

## 2024-01-28 DIAGNOSIS — G4700 Insomnia, unspecified: Secondary | ICD-10-CM | POA: Insufficient documentation

## 2024-01-28 DIAGNOSIS — F02B0 Dementia in other diseases classified elsewhere, moderate, without behavioral disturbance, psychotic disturbance, mood disturbance, and anxiety: Secondary | ICD-10-CM

## 2024-01-28 DIAGNOSIS — Z794 Long term (current) use of insulin: Secondary | ICD-10-CM | POA: Diagnosis not present

## 2024-01-28 DIAGNOSIS — E1140 Type 2 diabetes mellitus with diabetic neuropathy, unspecified: Secondary | ICD-10-CM

## 2024-01-28 DIAGNOSIS — E7800 Pure hypercholesterolemia, unspecified: Secondary | ICD-10-CM | POA: Diagnosis not present

## 2024-01-28 DIAGNOSIS — E2839 Other primary ovarian failure: Secondary | ICD-10-CM

## 2024-01-28 DIAGNOSIS — Z13820 Encounter for screening for osteoporosis: Secondary | ICD-10-CM | POA: Diagnosis not present

## 2024-01-28 DIAGNOSIS — G4709 Other insomnia: Secondary | ICD-10-CM | POA: Diagnosis not present

## 2024-01-28 DIAGNOSIS — Z1211 Encounter for screening for malignant neoplasm of colon: Secondary | ICD-10-CM

## 2024-01-28 LAB — POCT GLYCOSYLATED HEMOGLOBIN (HGB A1C): HbA1c, POC (controlled diabetic range): 7.7 % — AB (ref 0.0–7.0)

## 2024-01-28 MED ORDER — ATORVASTATIN CALCIUM 20 MG PO TABS
20.0000 mg | ORAL_TABLET | Freq: Every day | ORAL | 1 refills | Status: AC
  Filled 2024-01-28: qty 90, 90d supply, fill #0

## 2024-01-28 MED ORDER — MIRTAZAPINE 30 MG PO TABS
30.0000 mg | ORAL_TABLET | Freq: Every evening | ORAL | 1 refills | Status: AC
  Filled 2024-01-28: qty 90, 90d supply, fill #0
  Filled 2024-06-22: qty 90, 90d supply, fill #1

## 2024-01-28 MED ORDER — IBUPROFEN 600 MG PO TABS
600.0000 mg | ORAL_TABLET | Freq: Four times a day (QID) | ORAL | 0 refills | Status: AC | PRN
  Filled 2024-01-28: qty 30, 8d supply, fill #0

## 2024-01-28 MED ORDER — GABAPENTIN 300 MG PO CAPS
300.0000 mg | ORAL_CAPSULE | Freq: Every day | ORAL | 1 refills | Status: AC
  Filled 2024-01-28: qty 90, 90d supply, fill #0
  Filled 2024-06-22: qty 90, 90d supply, fill #1

## 2024-01-28 NOTE — Patient Instructions (Signed)
 VISIT SUMMARY:  Today, we discussed your leg pain, diabetes management, neuropathy, difficulty swallowing pills, and sleep issues. Your daughter was present to help with the discussion.  YOUR PLAN:  -TYPE 2 DIABETES MELLITUS WITH HYPERGLYCEMIA AND DIABETIC NEUROPATHY: Type 2 diabetes is a condition where your body does not use insulin  properly, leading to high blood sugar levels. Diabetic neuropathy is nerve damage caused by high blood sugar. We will switch your gabapentin  to the morning to avoid wakefulness at night, ensure you take glipizide  twice daily, and use a pill box to help with medication adherence. You can take ibuprofen  as needed for pain.  -MODERATE EARLY ONSET ALZHEIMER'S DEMENTIA: Alzheimer's dementia is a progressive brain disorder that affects memory and thinking skills. To help with swallowing difficulties, you can crush your pills and mix them with applesauce. We will also refill your mirtazapine  prescription.  -INSOMNIA: Insomnia is difficulty falling or staying asleep. Since gabapentin  caused wakefulness, you should take it in the morning. Continue taking mirtazapine  at bedtime to help with sleep.  -HYPERLIPIDEMIA: Hyperlipidemia is having high levels of fats in your blood, which can increase the risk of heart disease. You are taking atorvastatin  to manage this, and we will check your cholesterol levels today.  INSTRUCTIONS:  Please follow up with us  after your cholesterol levels are checked. Ensure you take your medications as prescribed and use a pill box to help with this. If you have any questions or concerns, do not hesitate to contact us .

## 2024-01-28 NOTE — Progress Notes (Signed)
 Subjective:  Patient ID: Revonda Rubin, female    DOB: 11-09-1960  Age: 63 y.o. MRN: [id number]  CC: Medical Management of Chronic Issues (Leg pain)     Discussed the use of AI scribe software for clinical note transcription with the patient, who gave verbal consent to proceed.  History of Present Illness Landi Biscardi is a 63 year old female with  type 2 diabetes mellitus, hyperlipidemia Alzheimer's disease   who presents with leg pain. She is accompanied by her daughter, who is her primary caregiver.  She experiences a needle-like sensation in her left foot since yesterday. Ibuprofen  was used for pain relief, and she went to bed earlier than usual due to discomfort.  Her diabetes is managed with metformin  and glipizide , though there are occasional lapses in taking the second dose of glipizide . Her A1c has improved from 9.6 to 7.7.  Gabapentin  300 mg is taken at night for neuropathy but causes wakefulness, leading to hesitancy in administration. Mirtazapine  is taken at night for sleep and appetite, but there is confusion about its timing as daughter has been administering this in the morning rather than nighttime.  She has difficulty swallowing pills, often keeping them in her mouth or attempting to spit them out. Her daughter inquires about crushing pills to aid in administration.  She is compliant with atorvastatin  for elevated cholesterol and also takes a multivitamin and melatonin.    Past Medical History:  Diagnosis Date   Anemia    DM type 2 (diabetes mellitus, type 2) (HCC)    HLD (hyperlipidemia)     No past surgical history on file.  Family History  Problem Relation Age of Onset   Diabetes Mother    Diabetes Father     Social History   Socioeconomic History   Marital status: Married    Spouse name: Not on file   Number of children: Not on file   Years of education: Not on file   Highest education level: Not on file  Occupational History   Not on file   Tobacco Use   Smoking status: Never   Smokeless tobacco: Never  Vaping Use   Vaping status: Never Used  Substance and Sexual Activity   Alcohol use: No   Drug use: No   Sexual activity: Yes  Other Topics Concern   Not on file  Social History Narrative   Lives with husband, 3 children. (23, 69, 29) Oldest daughter married.    Was working at [employer], but laid off.   Right hand   Social Drivers of Health   Financial Resource Strain: Medium Risk (10/28/2023)   Overall Financial Resource Strain (CARDIA)    Difficulty of Paying Living Expenses: Somewhat hard  Food Insecurity: Food Insecurity Present (10/28/2023)   Hunger Vital Sign    Worried About Running Out of Food in the Last Year: Often true    Ran Out of Food in the Last Year: Often true  Transportation Needs: No Transportation Needs (10/28/2023)   PRAPARE - Administrator, Civil Service (Medical): No    Lack of Transportation (Non-Medical): No  Physical Activity: Sufficiently Active (10/28/2023)   Exercise Vital Sign    Days of Exercise per Week: 7 days    Minutes of Exercise per Session: 60 min  Stress: No Stress Concern Present (10/28/2023)   [employer] of Occupational Health - Occupational Stress Questionnaire    Feeling of Stress : Not at all  Social Connections: Moderately Integrated (10/28/2023)  Social Connection and Isolation Panel    Frequency of Communication with Friends and Family: More than three times a week    Frequency of Social Gatherings with Friends and Family: More than three times a week    Attends Religious Services: 1 to 4 times per year    Active Member of [employer] or Organizations: No    Attends Banker Meetings: Never    Marital Status: Married    No Known Allergies  Outpatient Medications Prior to Visit  Medication Sig Dispense Refill   Accu-Chek Softclix Lancets lancets 1 each 3 (three) times daily before meals. 100 each 1   Blood Glucose Monitoring  Suppl (BLOOD GLUCOSE MONITOR SYSTEM) w/Device KIT Use to test once in the morning, at noon, and at bedtime. 1 kit 0   diclofenac  Sodium (VOLTAREN ) 1 % GEL Apply 2 g topically 4 (four) times daily. 150 g 0   glipiZIDE  (GLUCOTROL ) 5 MG tablet Take 1 tablet (5 mg total) by mouth 2 (two) times daily before a meal. 180 tablet 1   Glucose Blood (BLOOD GLUCOSE TEST STRIPS) STRP Use in the morning, at noon, and at bedtime. May substitute to any manufacturer covered by patient's insurance. 100 strip 1   Insulin  Pen Needle 31G X 5 MM MISC Use once daily at bedtime with insulin  30 each 1   memantine  (NAMENDA ) 10 MG tablet Take 1 tablet (10 mg total) by mouth 2 (two) times daily. 180 tablet 1   metFORMIN  (GLUCOPHAGE ) 500 MG tablet Take 2 tablets (1,000 mg total) by mouth 2 (two) times daily with a meal. 360 tablet 1   atorvastatin  (LIPITOR) 20 MG tablet Take 1 tablet (20 mg total) by mouth daily. 90 tablet 1   gabapentin  (NEURONTIN ) 300 MG capsule Take 1 capsule (300 mg total) by mouth at bedtime. 90 capsule 1   ibuprofen  (ADVIL ) 600 MG tablet Take 1 tablet (600 mg total) by mouth every 6 (six) hours as needed. 30 tablet 0   mirtazapine  (REMERON ) 30 MG tablet TAKE 1 TABLET (30 MG TOTAL) BY MOUTH AT BEDTIME. 30 tablet 1   insulin  glargine (LANTUS  SOLOSTAR) 100 UNIT/ML Solostar Pen Inject 10 Units into the skin daily. (Patient not taking: Reported on 01/28/2024) 30 mL 6   No facility-administered medications prior to visit.     ROS Review of Systems  Constitutional:  Negative for activity change and appetite change.  HENT:  Negative for sinus pressure and sore throat.   Respiratory:  Negative for chest tightness, shortness of breath and wheezing.   Cardiovascular:  Negative for chest pain and palpitations.  Gastrointestinal:  Negative for abdominal distention, abdominal pain and constipation.  Genitourinary: Negative.   Musculoskeletal:        See HPI  Psychiatric/Behavioral:  Negative for behavioral  problems and dysphoric mood.    tive:  BP 125/82   Pulse 85   Ht 5' 4 (1.626 m)   Wt 137 lb 6.4 oz (62.3 kg)   SpO2 95%   BMI 23.58 kg/m      01/28/2024   10:45 AM 10/28/2023    9:20 AM 10/09/2023    3:17 PM  BP/Weight  Systolic BP 125 134 155  Diastolic BP 82 81 95  Wt. (Lbs) 137.4 141   BMI 23.58 kg/m2 24.2 kg/m2     Wt Readings from Last 3 Encounters:  01/28/24 137 lb 6.4 oz (62.3 kg)  10/28/23 141 lb (64 kg)  04/21/22 153 lb (69.4 kg)  Physical Exam Constitutional:      Appearance: She is well-developed.  Cardiovascular:     Rate and Rhythm: Normal rate.     Heart sounds: Normal heart sounds. No murmur heard. Pulmonary:     Effort: Pulmonary effort is normal.     Breath sounds: Normal breath sounds. No wheezing or rales.  Chest:     Chest wall: No tenderness.  Abdominal:     General: Bowel sounds are normal. There is no distension.     Palpations: Abdomen is soft. There is no mass.     Tenderness: There is no abdominal tenderness.  Musculoskeletal:     Right lower leg: No edema.     Left lower leg: No edema.     Comments: On palpation of both feet she endorses presence of pain and it is difficult to localize area of pain as she is not able to be fully cooperative due to her dementia.  Neurological:     Mental Status: She is alert. Mental status is at baseline.  Psychiatric:        Mood and Affect: Mood normal.        Latest Ref Rng & Units 10/09/2023    4:27 PM 03/10/2022   11:15 AM 06/06/2021    9:25 AM  CMP  Glucose 70 - 99 mg/dL 764  599  782   BUN 8 - 23 mg/dL 13  12  9    Creatinine 0.44 - 1.00 mg/dL 9.31  9.02  9.24   Sodium 135 - 145 mmol/L 135  132  137   Potassium 3.5 - 5.1 mmol/L 4.0  4.7  5.1   Chloride 98 - 111 mmol/L 102  95  101   CO2 22 - 32 mmol/L 25  22  22    Calcium  8.9 - 10.3 mg/dL 9.2  9.8  9.8   Total Protein 6.5 - 8.1 g/dL 8.1  7.6    Total Bilirubin 0.0 - 1.2 mg/dL 0.6  0.3    Alkaline Phos 38 - 126 U/L 81  118    AST  15 - 41 U/L 22  18    ALT 0 - 44 U/L 16  15      Lipid Panel     Component Value Date/Time   CHOL 236 (H) 10/09/2023 1627   CHOL 174 03/10/2022 1115   TRIG 121 10/09/2023 1627   HDL 54 10/09/2023 1627   HDL 61 03/10/2022 1115   CHOLHDL 4.4 10/09/2023 1627   VLDL 24 10/09/2023 1627   LDLCALC 158 (H) 10/09/2023 1627   LDLCALC 97 03/10/2022 1115    CBC    Component Value Date/Time   WBC 7.1 10/09/2023 1627   RBC 5.12 (H) 10/09/2023 1627   HGB 12.2 10/09/2023 1627   HGB 13.6 05/22/2020 0906   HCT 38.6 10/09/2023 1627   HCT 43.3 05/22/2020 0906   PLT 335 10/09/2023 1627   PLT 350 05/22/2020 0906   MCV 75.4 (L) 10/09/2023 1627   MCV 78 (L) 05/22/2020 0906   MCH 23.8 (L) 10/09/2023 1627   MCHC 31.6 10/09/2023 1627   RDW 15.9 (H) 10/09/2023 1627   RDW 15.5 (H) 05/22/2020 0906   LYMPHSABS 3.1 10/09/2023 1627   LYMPHSABS 3.0 05/22/2020 0906   MONOABS 0.4 10/09/2023 1627   EOSABS 0.3 10/09/2023 1627   EOSABS 0.3 05/22/2020 0906   BASOSABS 0.0 10/09/2023 1627   BASOSABS 0.1 05/22/2020 0906    Lab Results  Component Value Date   HGBA1C  7.7 (A) 01/28/2024   Lab Results  Component Value Date   HGBA1C 7.7 (A) 01/28/2024   HGBA1C 9.6 (H) 10/09/2023   HGBA1C 12.0 (A) 03/10/2022        Assessment & Plan Type 2 diabetes mellitus with diabetic neuropathy Diabetic neuropathy with needle-like sensation in the left foot. Gabapentin  caused wakefulness. A1c improved from 9.6 to 7.7, indicating better glycemic control, though inconsistent glipizide . - Switch gabapentin  to morning dosing. - Ensure consistent glipizide  administration twice daily. - Use a pill box for medication adherence. - Prescribe ibuprofen  for pain as needed.  Moderate early onset Alzheimer's dementia Difficulty swallowing pills, possibly due to dementia progression. Mirtazapine  prescribed for sleep and appetite. - Crush pills and mix with applesauce if swallowing difficulty persists. - Continue  Namenda   Insomnia Gabapentin  caused wakefulness, interfering with sleep. - Administer gabapentin  in the morning. - Continue mirtazapine  at bedtime.  Hyperlipidemia Previous cholesterol levels elevated. Atorvastatin  used for management. - Check cholesterol levels today.     Healthcare maintenance: Screening for colon cancer Estrogen deficiency Declines mammogram Referred for bone density and colonoscopy  Meds ordered this encounter  Medications   mirtazapine  (REMERON ) 30 MG tablet    Sig: Take 1 tablet (30 mg total) by mouth at bedtime.    Dispense:  90 tablet    Refill:  1   atorvastatin  (LIPITOR) 20 MG tablet    Sig: Take 1 tablet (20 mg total) by mouth daily.    Dispense:  90 tablet    Refill:  1   gabapentin  (NEURONTIN ) 300 MG capsule    Sig: Take 1 capsule (300 mg total) by mouth daily.    Dispense:  90 capsule    Refill:  1   ibuprofen  (ADVIL ) 600 MG tablet    Sig: Take 1 tablet (600 mg total) by mouth every 6 (six) hours as needed.    Dispense:  30 tablet    Refill:  0    Follow-up: Return in about 3 months (around 04/29/2024) for Chronic medical conditions.       Corrina Sabin, MD, FAAFP. [facility], [place] [phone]   01/28/2024, 12:26 PM

## 2024-05-02 ENCOUNTER — Ambulatory Visit: Admitting: Family Medicine

## 2024-05-03 ENCOUNTER — Other Ambulatory Visit: Payer: Self-pay

## 2024-05-03 ENCOUNTER — Telehealth: Payer: Self-pay

## 2024-05-03 MED ORDER — MEMANTINE HCL 10 MG PO TABS
10.0000 mg | ORAL_TABLET | Freq: Two times a day (BID) | ORAL | 1 refills | Status: AC
  Filled 2024-05-03 – 2024-06-22 (×2): qty 180, 90d supply, fill #0

## 2024-05-03 NOTE — Telephone Encounter (Signed)
 Patient husband came in for refills and he was informed that refills are at [facility] pharmacy. He asked if it could be sent to our pharmacy

## 2024-05-11 ENCOUNTER — Other Ambulatory Visit: Payer: Self-pay

## 2024-05-13 ENCOUNTER — Other Ambulatory Visit: Payer: Self-pay

## 2024-05-25 ENCOUNTER — Telehealth: Payer: Self-pay | Admitting: Family Medicine

## 2024-05-25 ENCOUNTER — Ambulatory Visit: Admitting: Family Medicine

## 2024-05-25 NOTE — Telephone Encounter (Signed)
 Patient was contacted regarding missed appointment on 05/25/2024. Unable to leave voicemail.

## 2024-06-22 ENCOUNTER — Telehealth: Payer: Self-pay | Admitting: Family Medicine

## 2024-06-22 ENCOUNTER — Other Ambulatory Visit: Payer: Self-pay

## 2024-06-22 ENCOUNTER — Other Ambulatory Visit: Payer: Self-pay | Admitting: Family Medicine

## 2024-06-22 DIAGNOSIS — E1165 Type 2 diabetes mellitus with hyperglycemia: Secondary | ICD-10-CM

## 2024-06-22 MED ORDER — METFORMIN HCL 500 MG PO TABS
1000.0000 mg | ORAL_TABLET | Freq: Two times a day (BID) | ORAL | 0 refills | Status: AC
  Filled 2024-06-22: qty 120, 30d supply, fill #0

## 2024-06-22 NOTE — Telephone Encounter (Signed)
 Patient need a refill of her Gabapentin  300mg , metformin  500mg , and memantine  10mg   sent [facility] to [facility] day supply

## 2024-06-23 ENCOUNTER — Other Ambulatory Visit: Payer: Self-pay

## 2024-06-23 NOTE — Telephone Encounter (Signed)
 Message states that call can not be completed at this time. Pt has refills of medication at her pharmacy.

## 2024-07-01 ENCOUNTER — Other Ambulatory Visit: Payer: Self-pay
